# Patient Record
Sex: Female | Born: 1970 | Race: Black or African American | Hispanic: No | Marital: Single | Smoking: Former smoker
Health system: Southern US, Community
[De-identification: ages and names within clinical notes are randomized; demographics above are authoritative.]

## PROBLEM LIST (undated history)

## (undated) DIAGNOSIS — Z5900 Homelessness unspecified: Secondary | ICD-10-CM

## (undated) DIAGNOSIS — D571 Sickle-cell disease without crisis: Secondary | ICD-10-CM

## (undated) DIAGNOSIS — I10 Essential (primary) hypertension: Secondary | ICD-10-CM

## (undated) DIAGNOSIS — J45909 Unspecified asthma, uncomplicated: Secondary | ICD-10-CM

## (undated) DIAGNOSIS — H9192 Unspecified hearing loss, left ear: Secondary | ICD-10-CM

## (undated) HISTORY — PX: SPLENECTOMY, TOTAL: SHX788

## (undated) HISTORY — PX: CHOLECYSTECTOMY: SHX55

## (undated) HISTORY — PX: EYE SURGERY: SHX253

---

## 2008-09-10 ENCOUNTER — Ambulatory Visit: Payer: Self-pay | Admitting: Internal Medicine

## 2008-09-15 ENCOUNTER — Emergency Department: Payer: Self-pay | Admitting: Emergency Medicine

## 2008-09-21 ENCOUNTER — Inpatient Hospital Stay: Payer: Self-pay | Admitting: *Deleted

## 2008-10-05 ENCOUNTER — Ambulatory Visit: Payer: Self-pay | Admitting: Internal Medicine

## 2008-10-11 ENCOUNTER — Ambulatory Visit: Payer: Self-pay | Admitting: Internal Medicine

## 2009-07-14 ENCOUNTER — Ambulatory Visit: Payer: Self-pay | Admitting: Family Medicine

## 2009-12-11 ENCOUNTER — Ambulatory Visit: Payer: Self-pay | Admitting: Oncology

## 2009-12-26 ENCOUNTER — Inpatient Hospital Stay: Payer: Self-pay | Admitting: Internal Medicine

## 2010-01-11 ENCOUNTER — Ambulatory Visit: Payer: Self-pay | Admitting: Oncology

## 2010-04-18 ENCOUNTER — Ambulatory Visit: Payer: Self-pay | Admitting: Internal Medicine

## 2012-02-13 ENCOUNTER — Emergency Department: Payer: Self-pay | Admitting: Emergency Medicine

## 2012-02-13 LAB — URINALYSIS, COMPLETE
Bacteria: NONE SEEN
Bilirubin,UR: NEGATIVE
Glucose,UR: 50 mg/dL (ref 0–75)
Leukocyte Esterase: NEGATIVE
Nitrite: NEGATIVE
Specific Gravity: 1.012 (ref 1.003–1.030)
Squamous Epithelial: NONE SEEN
WBC UR: 4 /HPF (ref 0–5)

## 2012-02-13 LAB — DIFFERENTIAL: NRBC/100 WBC: 1 /

## 2012-02-13 LAB — COMPREHENSIVE METABOLIC PANEL
Anion Gap: 7 (ref 7–16)
Calcium, Total: 9.4 mg/dL (ref 8.5–10.1)
Co2: 27 mmol/L (ref 21–32)
EGFR (Non-African Amer.): >60
Osmolality: 275 (ref 275–301)
Potassium: 4.3 mmol/L (ref 3.5–5.1)
Sodium: 138 mmol/L (ref 136–145)

## 2012-02-13 LAB — CBC
HCT: 32.5 % — ABNORMAL LOW (ref 35.0–47.0)
Platelet: 243 10*3/uL (ref 150–440)
RBC: 3.18 10*6/uL — ABNORMAL LOW (ref 3.80–5.20)
RDW: 17.4 % — ABNORMAL HIGH (ref 11.5–14.5)
WBC: 12 10*3/uL — ABNORMAL HIGH (ref 3.6–11.0)

## 2012-02-13 LAB — LACTATE DEHYDROGENASE: LDH: 435 U/L — ABNORMAL HIGH (ref 84–246)

## 2012-02-13 LAB — LIPASE, BLOOD: Lipase: 65 U/L — ABNORMAL LOW (ref 73–393)

## 2012-10-23 ENCOUNTER — Encounter (HOSPITAL_COMMUNITY): Payer: Self-pay | Admitting: *Deleted

## 2012-10-23 ENCOUNTER — Emergency Department (HOSPITAL_COMMUNITY)
Admission: EM | Admit: 2012-10-23 | Discharge: 2012-10-23 | Disposition: A | Discharge status: Home / Self Care | Payer: Medicare Other | Attending: Emergency Medicine | Admitting: Emergency Medicine

## 2012-10-23 ENCOUNTER — Emergency Department (HOSPITAL_COMMUNITY): Payer: Medicare Other

## 2012-10-23 ENCOUNTER — Other Ambulatory Visit: Payer: Self-pay

## 2012-10-23 DIAGNOSIS — D571 Sickle-cell disease without crisis: Secondary | ICD-10-CM | POA: Insufficient documentation

## 2012-10-23 DIAGNOSIS — J3489 Other specified disorders of nose and nasal sinuses: Secondary | ICD-10-CM | POA: Insufficient documentation

## 2012-10-23 DIAGNOSIS — R0789 Other chest pain: Secondary | ICD-10-CM | POA: Insufficient documentation

## 2012-10-23 DIAGNOSIS — R059 Cough, unspecified: Secondary | ICD-10-CM | POA: Insufficient documentation

## 2012-10-23 DIAGNOSIS — F17200 Nicotine dependence, unspecified, uncomplicated: Secondary | ICD-10-CM | POA: Insufficient documentation

## 2012-10-23 DIAGNOSIS — R05 Cough: Secondary | ICD-10-CM | POA: Insufficient documentation

## 2012-10-23 DIAGNOSIS — R062 Wheezing: Secondary | ICD-10-CM | POA: Insufficient documentation

## 2012-10-23 DIAGNOSIS — J441 Chronic obstructive pulmonary disease with (acute) exacerbation: Secondary | ICD-10-CM | POA: Insufficient documentation

## 2012-10-23 DIAGNOSIS — Z79899 Other long term (current) drug therapy: Secondary | ICD-10-CM | POA: Insufficient documentation

## 2012-10-23 HISTORY — DX: Sickle-cell disease without crisis: D57.1

## 2012-10-23 LAB — CBC WITH DIFFERENTIAL/PLATELET
Basophils Absolute: 0.1 10*3/uL (ref 0.0–0.1)
Lymphocytes Relative: 12 % (ref 12–46)
Monocytes Relative: 10 % (ref 3–12)
Platelets: 358 10*3/uL (ref 150–400)
RDW: 15.5 % (ref 11.5–15.5)
WBC: 13.4 10*3/uL — ABNORMAL HIGH (ref 4.0–10.5)

## 2012-10-23 LAB — BASIC METABOLIC PANEL
BUN: 10 mg/dL (ref 6–23)
Calcium: 9.6 mg/dL (ref 8.4–10.5)
GFR calc Af Amer: >90 mL/min (ref >90)
GFR calc non Af Amer: >90 mL/min (ref >90)
Potassium: 3.1 mEq/L — ABNORMAL LOW (ref 3.5–5.1)
Sodium: 137 mEq/L (ref 135–145)

## 2012-10-23 LAB — POCT I-STAT TROPONIN I

## 2012-10-23 MED ORDER — IPRATROPIUM BROMIDE 0.02 % IN SOLN
RESPIRATORY_TRACT | Status: AC
  Administered 2012-10-23: 0.5 mg via RESPIRATORY_TRACT
  Filled 2012-10-23: qty 2.5

## 2012-10-23 MED ORDER — POTASSIUM CHLORIDE CRYS ER 20 MEQ PO TBCR
40.0000 meq | EXTENDED_RELEASE_TABLET | Freq: Once | ORAL | Status: AC
  Administered 2012-10-23: 40 meq via ORAL
  Filled 2012-10-23: qty 2

## 2012-10-23 MED ORDER — ALBUTEROL SULFATE HFA 108 (90 BASE) MCG/ACT IN AERS: 1.0000 | INHALATION_SPRAY | Freq: Four times a day (QID) | RESPIRATORY_TRACT | Status: DC | PRN

## 2012-10-23 MED ORDER — PREDNISONE 20 MG PO TABS: 40.0000 mg | ORAL_TABLET | Freq: Every day | ORAL | Status: DC

## 2012-10-23 MED ORDER — ALBUTEROL SULFATE (5 MG/ML) 0.5% IN NEBU
5.0000 mg | INHALATION_SOLUTION | Freq: Once | RESPIRATORY_TRACT | Status: AC
  Administered 2012-10-23: 5 mg via RESPIRATORY_TRACT
  Filled 2012-10-23: qty 1

## 2012-10-23 MED ORDER — IPRATROPIUM BROMIDE 0.02 % IN SOLN
0.5000 mg | Freq: Once | RESPIRATORY_TRACT | Status: AC
  Administered 2012-10-23: 0.5 mg via RESPIRATORY_TRACT
  Filled 2012-10-23: qty 2.5

## 2012-10-23 MED ORDER — ALBUTEROL SULFATE (5 MG/ML) 0.5% IN NEBU
INHALATION_SOLUTION | RESPIRATORY_TRACT | Status: AC
  Administered 2012-10-23: 12:00:00
  Filled 2012-10-23: qty 1

## 2012-10-23 MED ORDER — PREDNISONE 20 MG PO TABS
40.0000 mg | ORAL_TABLET | Freq: Once | ORAL | Status: AC
  Administered 2012-10-23: 40 mg via ORAL
  Filled 2012-10-23: qty 2

## 2012-10-23 NOTE — ED Notes (Signed)
Pt states "I can breathe fine." Pt denies shortness of breath.

## 2012-10-23 NOTE — ED Notes (Signed)
Ambulated pt.  Sats dropped to 86% and HR of 124.  PA Heather notified.  Pt requesting d/c home.

## 2012-10-23 NOTE — ED Notes (Signed)
Pt given nebulizer treatment

## 2012-10-23 NOTE — ED Provider Notes (Signed)
History     CSN: [id number]  Arrival date & time 10/23/12  1147   First MD Initiated Contact with Patient 10/23/12 1419      Chief Complaint  Patient presents with  . Shortness of Breath  . Cough    (Consider location/radiation/quality/duration/timing/severity/associated sxs/prior treatment) HPI Comments: Patient presents with a chief complaint of productive cough, nasal congestion, shortness of breath, wheezing, and tightness in her chest.  Symptoms have been present for the past 2 days and are gradually worsening.  She has taken Robitussin for her symptoms, but does not feel that it helps.  She does not have an inhaler.  She has given a nebulized breathing treatment in triage prior to my evaluation, which she reports did help with her symptoms.  She denies fever or chills.  Denies CP.  She reports that she has never been diagnosed with COPD or asthma.  She currently smokes 1ppd and has for approximately 22 years.    The history is provided by the patient.    Past Medical History  Diagnosis Date  . Sickle cell anemia     Past Surgical History  Procedure Date  . Cholecystectomy   . Eye surgery   . Splenectomy, total     No family history on file.  History  Substance Use Topics  . Smoking status: Current Every Day Smoker  . Smokeless tobacco: Not on file  . Alcohol Use: No    OB History    Grav Para Term Preterm Abortions TAB SAB Ect Mult Living                  Review of Systems  Constitutional: Negative for fever, chills and diaphoresis.  Respiratory: Positive for cough, chest tightness, shortness of breath and wheezing.   Cardiovascular: Negative for chest pain, palpitations and leg swelling.  Gastrointestinal: Negative for nausea and vomiting.  Neurological: Negative for dizziness, syncope and light-headedness.    Allergies  Review of patient's allergies indicates no known allergies.  Home Medications   Current Outpatient Rx  Name  Route  Sig   Dispense  Refill  . ALKA-SELTZER PLUS COLD PO   Oral   Take 1 capsule by mouth 2 (two) times daily as needed. For cold         . GUAIFENESIN 100 MG/5ML PO SOLN   Oral   Take 15 mLs by mouth every 4 (four) hours as needed. For cough         . LISINOPRIL-HYDROCHLOROTHIAZIDE 20-25 MG PO TABS   Oral   Take 1 tablet by mouth daily.         . ADULT MULTIVITAMIN W/MINERALS CH   Oral   Take 1 tablet by mouth daily.           BP 140/93  Pulse 91  Temp 98.1 F (36.7 C) (Oral)  Resp 26  SpO2 90%  LMP 10/06/2012  Physical Exam  Vitals reviewed. Constitutional: She appears well-developed and well-nourished. No distress.  HENT:  Head: Normocephalic and atraumatic.  Right Ear: Tympanic membrane and ear canal normal.  Left Ear: Tympanic membrane and ear canal normal.  Nose: Nose normal.  Mouth/Throat: Uvula is midline, oropharynx is clear and moist and mucous membranes are normal.  Neck: Normal range of motion. Neck supple.  Cardiovascular: Normal rate, regular rhythm and normal heart sounds.   Pulmonary/Chest: Effort normal. No accessory muscle usage. No respiratory distress. She has decreased breath sounds. She has wheezes.  Mild diffuse wheezing  Patient able to speak in complete sentences without difficulty.  Abdominal: Soft. There is no tenderness.  Neurological: She is alert.  Skin: Skin is warm and dry. She is not diaphoretic.  Psychiatric: She has a normal mood and affect.    ED Course  Procedures (including critical care time)  Labs Reviewed  CBC WITH DIFFERENTIAL - Abnormal; Notable for the following:    WBC 13.4 (*)     RBC 3.04 (*)     Hemoglobin 10.5 (*)     HCT 28.0 (*)     MCH 34.5 (*)     MCHC 37.5 (*)  SPHEROCYTES   All other components within normal limits  BASIC METABOLIC PANEL - Abnormal; Notable for the following:    Potassium 3.1 (*)     Glucose, Bld 112 (*)     All other components within normal limits  POCT I-STAT TROPONIN I   Dg  Chest 2 View  10/23/2012  *RADIOLOGY REPORT*  Clinical Data: Shortness of breath, cough, smoking history, sickle cell disease  CHEST - 2 VIEW  Comparison: None.  Findings: The lungs are clear but hyperaerated indicative of COPD. Mediastinal contours appear normal.  The heart is within normal limits in size.  No bony abnormality is seen.  IMPRESSION: No active lung disease.  Hyperaeration consistent with COPD.   Original Report Authenticated By: Dwyane Dee, M.D.      No diagnosis found.  4:28 PM Reassessed patient.  Lungs CTAB.  She reports that the shortness of breath and the tightness in her chest has resolved.  She is requesting to be discharged.    MDM   Patient presenting with COPD exacerbation.  Lung exam improved after nebulized breathing treatments. Pt states she breathing at baseline at the time of discharge. Pt has been instructed to continue using prescribed medications and inhaler.  Patient instructed to follow up with PCP.  Return precautions discussed with the patient.  She is in agreement with the plan.          Pascal Lux [place], PA-C 10/23/12 2349

## 2012-10-23 NOTE — ED Notes (Signed)
PT is here with sob started yesterday.  COUGH and cold feeling started Monday.  Pt has some chest tightness.  Pt reports yellow and white sputum

## 2012-10-23 NOTE — ED Notes (Signed)
Pt states since given medication she has been able to breathe better.

## 2012-10-24 NOTE — ED Provider Notes (Signed)
Medical screening examination/treatment/procedure(s) were performed by non-physician practitioner and as supervising physician I was immediately available for consultation/collaboration.  Tim Corriher, MD 10/24/12 1743 

## 2013-04-22 ENCOUNTER — Encounter (HOSPITAL_COMMUNITY): Payer: Self-pay | Admitting: *Deleted

## 2013-04-22 ENCOUNTER — Emergency Department (INDEPENDENT_AMBULATORY_CARE_PROVIDER_SITE_OTHER)
Admission: EM | Admit: 2013-04-22 | Discharge: 2013-04-22 | Disposition: A | Discharge status: Home / Self Care | Payer: Medicare Other | Source: Home / Self Care | Attending: Family Medicine | Admitting: Family Medicine

## 2013-04-22 ENCOUNTER — Emergency Department (INDEPENDENT_AMBULATORY_CARE_PROVIDER_SITE_OTHER): Payer: Medicare Other

## 2013-04-22 DIAGNOSIS — J45901 Unspecified asthma with (acute) exacerbation: Secondary | ICD-10-CM

## 2013-04-22 HISTORY — DX: Essential (primary) hypertension: I10

## 2013-04-22 HISTORY — DX: Unspecified asthma, uncomplicated: J45.909

## 2013-04-22 MED ORDER — METHYLPREDNISOLONE SODIUM SUCC 125 MG IJ SOLR
INTRAMUSCULAR | Status: AC
  Filled 2013-04-22: qty 2

## 2013-04-22 MED ORDER — METHYLPREDNISOLONE SODIUM SUCC 125 MG IJ SOLR
125.0000 mg | Freq: Once | INTRAMUSCULAR | Status: AC
  Administered 2013-04-22: 125 mg via INTRAMUSCULAR

## 2013-04-22 MED ORDER — ALBUTEROL SULFATE HFA 108 (90 BASE) MCG/ACT IN AERS: 1.0000 | INHALATION_SPRAY | Freq: Four times a day (QID) | RESPIRATORY_TRACT | Status: DC | PRN

## 2013-04-22 MED ORDER — ALBUTEROL SULFATE (5 MG/ML) 0.5% IN NEBU
INHALATION_SOLUTION | RESPIRATORY_TRACT | Status: AC
  Filled 2013-04-22: qty 1

## 2013-04-22 MED ORDER — LEVOFLOXACIN 500 MG PO TABS: 500.0000 mg | ORAL_TABLET | Freq: Every day | ORAL | Status: DC

## 2013-04-22 MED ORDER — ALBUTEROL SULFATE (5 MG/ML) 0.5% IN NEBU
5.0000 mg | INHALATION_SOLUTION | Freq: Once | RESPIRATORY_TRACT | Status: AC
  Administered 2013-04-22: 5 mg via RESPIRATORY_TRACT

## 2013-04-22 MED ORDER — IPRATROPIUM BROMIDE 0.02 % IN SOLN
0.5000 mg | Freq: Once | RESPIRATORY_TRACT | Status: AC
  Administered 2013-04-22: 0.5 mg via RESPIRATORY_TRACT

## 2013-04-22 NOTE — ED Notes (Signed)
Pt reports a flare of her asthma  She is not using an inhaler now

## 2013-04-22 NOTE — ED Provider Notes (Signed)
History     CSN: [id number]  Arrival date & time 04/22/13  1223   First MD Initiated Contact with Patient 04/22/13 1332      Chief Complaint  Patient presents with  . Asthma    (Consider location/radiation/quality/duration/timing/severity/associated sxs/prior treatment) Patient is a 42 y.o. female presenting with asthma. The history is provided by the patient.  Asthma This is a new problem. The current episode started 2 days ago. The problem has been gradually worsening. Associated symptoms include shortness of breath. The symptoms are aggravated by coughing, sneezing and smoking.    Past Medical History  Diagnosis Date  . Sickle cell anemia   . Asthma   . Hypertension     Past Surgical History  Procedure Laterality Date  . Cholecystectomy    . Eye surgery    . Splenectomy, total      No family history on file.  History  Substance Use Topics  . Smoking status: Current Every Day Smoker    Types: Cigarettes  . Smokeless tobacco: Not on file  . Alcohol Use: No    OB History   Grav Para Term Preterm Abortions TAB SAB Ect Mult Living                  Review of Systems  Constitutional: Negative.   HENT: Negative.   Respiratory: Positive for cough, chest tightness and shortness of breath.   Cardiovascular: Negative.     Allergies  Review of patient's allergies indicates no known allergies.  Home Medications   Current Outpatient Rx  Name  Route  Sig  Dispense  Refill  . lisinopril-hydrochlorothiazide (PRINZIDE,ZESTORETIC) 20-25 MG per tablet   Oral   Take 1 tablet by mouth daily.         . Multiple Vitamin (MULTIVITAMIN WITH MINERALS) TABS   Oral   Take 1 tablet by mouth daily.         [name] albuterol (PROVENTIL HFA;VENTOLIN HFA) 108 (90 BASE) MCG/ACT inhaler   Inhalation   Inhale 1-2 puffs into the lungs every 6 (six) hours as needed for wheezing.   1 Inhaler   0   . albuterol (PROVENTIL HFA;VENTOLIN HFA) 108 (90 BASE) MCG/ACT inhaler  Inhalation   Inhale 1-2 puffs into the lungs every 6 (six) hours as needed for wheezing.   1 Inhaler   0   . levofloxacin (LEVAQUIN) 500 MG tablet   Oral   Take 1 tablet (500 mg total) by mouth daily.   7 tablet   0     BP 178/99  Pulse 76  Temp(Src) 97.3 F (36.3 C) (Oral)  Resp 20  SpO2 93%  LMP 04/07/2013  Physical Exam  Nursing note and vitals reviewed. Constitutional: She is oriented to person, place, and time. She appears well-developed and well-nourished.  HENT:  Head: Normocephalic.  Right Ear: External ear normal.  Left Ear: External ear normal.  Mouth/Throat: Oropharynx is clear and moist.  Eyes: Pupils are equal, round, and reactive to light.  Neck: Normal range of motion. Neck supple.  Cardiovascular: Normal rate and regular rhythm.   Pulmonary/Chest: Effort normal. She has wheezes.  Lymphadenopathy:    She has no cervical adenopathy.  Neurological: She is alert and oriented to person, place, and time.  Skin: Skin is warm and dry.    ED Course  Procedures (including critical care time)  Labs Reviewed - No data to display Dg Chest 2 View  04/22/2013  *RADIOLOGY REPORT*  Clinical Data: Cough, shortness  of breath  CHEST - 2 VIEW  Comparison: 10/23/2012  Findings: Stable hyperinflation.  No focal pneumonia, collapse, consolidation, edema, effusion or pneumothorax.  Trachea midline. Normal heart size and vascularity.  IMPRESSION: Hyperinflation without focal pneumonia   Original Report Authenticated By: Judie Petit. Shick, M.D.      1. Asthmatic bronchitis with exacerbation       MDM  X-rays reviewed and report per radiologist.  Sx improved, lungs clear after neb at d/c.         Linna Hoff, MD 04/22/13 1431

## 2013-07-20 ENCOUNTER — Encounter (HOSPITAL_COMMUNITY): Payer: Self-pay

## 2013-07-20 ENCOUNTER — Emergency Department (HOSPITAL_COMMUNITY): Payer: Medicare Other

## 2013-07-20 ENCOUNTER — Emergency Department (HOSPITAL_COMMUNITY)
Admission: EM | Admit: 2013-07-20 | Discharge: 2013-07-20 | Discharge status: Against Medical Advice | Payer: Medicare Other | Attending: Emergency Medicine | Admitting: Emergency Medicine

## 2013-07-20 DIAGNOSIS — J45901 Unspecified asthma with (acute) exacerbation: Secondary | ICD-10-CM | POA: Insufficient documentation

## 2013-07-20 DIAGNOSIS — Z862 Personal history of diseases of the blood and blood-forming organs and certain disorders involving the immune mechanism: Secondary | ICD-10-CM | POA: Insufficient documentation

## 2013-07-20 DIAGNOSIS — M62838 Other muscle spasm: Secondary | ICD-10-CM | POA: Insufficient documentation

## 2013-07-20 DIAGNOSIS — Z9889 Other specified postprocedural states: Secondary | ICD-10-CM | POA: Insufficient documentation

## 2013-07-20 DIAGNOSIS — R197 Diarrhea, unspecified: Secondary | ICD-10-CM | POA: Insufficient documentation

## 2013-07-20 DIAGNOSIS — J449 Chronic obstructive pulmonary disease, unspecified: Secondary | ICD-10-CM | POA: Insufficient documentation

## 2013-07-20 DIAGNOSIS — R109 Unspecified abdominal pain: Secondary | ICD-10-CM

## 2013-07-20 DIAGNOSIS — M542 Cervicalgia: Secondary | ICD-10-CM | POA: Insufficient documentation

## 2013-07-20 DIAGNOSIS — M25512 Pain in left shoulder: Secondary | ICD-10-CM

## 2013-07-20 DIAGNOSIS — R42 Dizziness and giddiness: Secondary | ICD-10-CM | POA: Insufficient documentation

## 2013-07-20 DIAGNOSIS — Z3202 Encounter for pregnancy test, result negative: Secondary | ICD-10-CM | POA: Insufficient documentation

## 2013-07-20 DIAGNOSIS — Z79899 Other long term (current) drug therapy: Secondary | ICD-10-CM | POA: Insufficient documentation

## 2013-07-20 DIAGNOSIS — M25519 Pain in unspecified shoulder: Secondary | ICD-10-CM | POA: Insufficient documentation

## 2013-07-20 DIAGNOSIS — J4489 Other specified chronic obstructive pulmonary disease: Secondary | ICD-10-CM | POA: Insufficient documentation

## 2013-07-20 DIAGNOSIS — J441 Chronic obstructive pulmonary disease with (acute) exacerbation: Secondary | ICD-10-CM | POA: Insufficient documentation

## 2013-07-20 DIAGNOSIS — F411 Generalized anxiety disorder: Secondary | ICD-10-CM | POA: Insufficient documentation

## 2013-07-20 DIAGNOSIS — I10 Essential (primary) hypertension: Secondary | ICD-10-CM | POA: Insufficient documentation

## 2013-07-20 DIAGNOSIS — F17200 Nicotine dependence, unspecified, uncomplicated: Secondary | ICD-10-CM | POA: Insufficient documentation

## 2013-07-20 LAB — CBC WITH DIFFERENTIAL/PLATELET
Basophils Absolute: 0.1 10*3/uL (ref 0.0–0.1)
Eosinophils Absolute: 0.1 10*3/uL (ref 0.0–0.7)
Lymphs Abs: 2.5 10*3/uL (ref 0.7–4.0)
MCHC: 37.3 g/dL — ABNORMAL HIGH (ref 30.0–36.0)
MCV: 86.9 fL (ref 78.0–100.0)
Monocytes Relative: 8 % (ref 3–12)
Platelets: 363 10*3/uL (ref 150–400)
RDW: 15.7 % — ABNORMAL HIGH (ref 11.5–15.5)
WBC: 13.2 10*3/uL — ABNORMAL HIGH (ref 4.0–10.5)

## 2013-07-20 LAB — URINALYSIS, ROUTINE W REFLEX MICROSCOPIC
Leukocytes, UA: NEGATIVE
Nitrite: NEGATIVE
Protein, ur: NEGATIVE mg/dL
Urobilinogen, UA: 0.2 mg/dL (ref 0.0–1.0)

## 2013-07-20 LAB — COMPREHENSIVE METABOLIC PANEL
ALT: 17 U/L (ref 0–35)
BUN: 13 mg/dL (ref 6–23)
Calcium: 9.9 mg/dL (ref 8.4–10.5)
Creatinine, Ser: 0.71 mg/dL (ref 0.50–1.10)
GFR calc Af Amer: >90 mL/min (ref >90)
GFR calc non Af Amer: >90 mL/min (ref >90)
Glucose, Bld: 84 mg/dL (ref 70–99)
Sodium: 136 mEq/L (ref 135–145)
Total Protein: 8.1 g/dL (ref 6.0–8.3)

## 2013-07-20 LAB — LIPASE, BLOOD: Lipase: 39 U/L (ref 11–59)

## 2013-07-20 LAB — PREGNANCY, URINE: Preg Test, Ur: NEGATIVE

## 2013-07-20 LAB — POCT I-STAT TROPONIN I: Troponin i, poc: 0 ng/mL (ref 0.00–0.08)

## 2013-07-20 MED ORDER — IBUPROFEN 800 MG PO TABS
800.0000 mg | ORAL_TABLET | Freq: Once | ORAL | Status: AC
  Administered 2013-07-20: 800 mg via ORAL
  Filled 2013-07-20: qty 1

## 2013-07-20 NOTE — ED Provider Notes (Signed)
Medical screening examination/treatment/procedure(s) were performed by non-physician practitioner and as supervising physician I was immediately available for consultation/collaboration.  Patient with multiple complaints with a history of hypertension, COPD and sickle cell. Patient was complaining of some neck pain, lightheadedness and shortness of breath. She's been evaluated for ACS and was recommended to stay in the ED for second set of cardiac enzymes as well as d-dimer to rule out PE. Patient did have some mild hypoxia on exam.  Patient decided to leave the emergency department AGAINST MEDICAL ADVICE and was fully aware of the risks including death and was competent and capable of making this decision. Given return precautions.  Brittany Maw Claudeen Leason, DO 07/20/13 1554

## 2013-07-20 NOTE — ED Notes (Signed)
RUE:[id number]<JW> Expected date:<BR> Expected time:<BR> Means of arrival:<BR> Comments:<BR> 42 y/o F abd pain

## 2013-07-20 NOTE — ED Provider Notes (Signed)
CSN: [id number]     Arrival date & time 07/20/13  4098 History     First MD Initiated Contact with Patient 07/20/13 1006     Chief Complaint  Patient presents with  . Abdominal Pain  . Diarrhea  . Neck Spasm   . Anxiety   (Consider location/radiation/quality/duration/timing/severity/associated sxs/prior Treatment) HPI Comments: Patient is a 42 year old female with a history of hypertension, COPD, sickle cell anemia, and asthma who presents for a pain to her left neck and shoulder with associated shortness of breath and lightheadedness. Patient states the symptoms lasted approximately 20 minutes before spontaneously resolving. Has had similar episodes over the last 3 days which have also spontaneously resolved, but associated SOB and lightheadedness were new to presentation today. She denies any modifying factors of her symptoms as well as any associated fever, vision changes, chest pain, extremity numbness/tingling or weakness, and nausea or vomiting. Patient attributes symptoms to an elevation in her blood pressure. Endorses compliance with blood pressure medication; takes Prinzide daily. Patient also with secondary complaint of L suprapubic abdominal discomfort which is intermittent and burning and cramping in nature x 4 days. Denies diarrhea, melena, hematochezia, dysuria, and hematuria. LMP in April 2014; patient endorses sexual activity without use of barrier protection.  The history is provided by the patient. No language interpreter was used.    Past Medical History  Diagnosis Date  . Sickle cell anemia   . Asthma   . Hypertension    Past Surgical History  Procedure Laterality Date  . Cholecystectomy    . Eye surgery    . Splenectomy, total     No family history on file. History  Substance Use Topics  . Smoking status: Current Every Day Smoker    Types: Cigarettes  . Smokeless tobacco: Not on file  . Alcohol Use: No   OB History   Grav Para Term Preterm Abortions TAB  SAB Ect Mult Living                 Review of Systems  Constitutional: Negative for fever.  Respiratory: Positive for shortness of breath.   Gastrointestinal: Positive for abdominal pain. Negative for nausea and vomiting.  Genitourinary: Negative for dysuria and hematuria.  Musculoskeletal:       +"muscle spasm on L side of neck"  Neurological: Positive for light-headedness. Negative for dizziness and syncope.  All other systems reviewed and are negative.   Allergies  Review of patient's allergies indicates no known allergies.  Home Medications   Current Outpatient Rx  Name  Route  Sig  Dispense  Refill  . albuterol (PROVENTIL HFA;VENTOLIN HFA) 108 (90 BASE) MCG/ACT inhaler   Inhalation   Inhale 2 puffs into the lungs every 6 (six) hours as needed for wheezing.         [name] lisinopril-hydrochlorothiazide (PRINZIDE,ZESTORETIC) 20-25 MG per tablet   Oral   Take 1 tablet by mouth daily.          BP 124/85  Pulse 76  Temp(Src) 97.7 F (36.5 C) (Oral)  Resp 18  SpO2 94%  Physical Exam  Nursing note and vitals reviewed. Constitutional: She is oriented to person, place, and time. She appears well-developed and well-nourished. No distress.  HENT:  Head: Normocephalic and atraumatic.  Mouth/Throat: Oropharynx is clear and moist. No oropharyngeal exudate.  Eyes: Conjunctivae and EOM are normal. Pupils are equal, round, and reactive to light. No scleral icterus.  Neck: Normal range of motion.  Cardiovascular: Normal rate, regular rhythm and  normal heart sounds.   Pulmonary/Chest: Effort normal and breath sounds normal. No respiratory distress. She has no wheezes. She has no rales.  Abdominal: Soft. She exhibits no distension. There is no tenderness. There is no rebound and no guarding.  Musculoskeletal: Normal range of motion.  Neurological: She is alert and oriented to person, place, and time.  Skin: Skin is warm and dry. No rash noted. She is not diaphoretic. No erythema.   Psychiatric: She has a normal mood and affect. Her behavior is normal.   ED Course   Procedures (including critical care time)  Labs Reviewed  CBC WITH DIFFERENTIAL - Abnormal; Notable for the following:    WBC 13.2 (*)    RBC 2.90 (*)    Hemoglobin 9.4 (*)    HCT 25.2 (*)    MCHC 37.3 (*)    RDW 15.7 (*)    Neutro Abs 9.4 (*)    Monocytes Absolute 1.1 (*)    All other components within normal limits  COMPREHENSIVE METABOLIC PANEL - Abnormal; Notable for the following:    Total Bilirubin 3.2 (*)    All other components within normal limits  URINALYSIS, ROUTINE W REFLEX MICROSCOPIC - Abnormal; Notable for the following:    Hgb urine dipstick MODERATE (*)    All other components within normal limits  URINE MICROSCOPIC-ADD ON - Abnormal; Notable for the following:    Squamous Epithelial / LPF FEW (*)    Bacteria, UA FEW (*)    All other components within normal limits  LIPASE, BLOOD  PREGNANCY, URINE  POCT I-STAT TROPONIN I    Date: 07/20/2013  Rate: 73  Rhythm: normal sinus rhythm  QRS Axis: normal  Intervals: normal  ST/T Wave abnormalities: normal  Conduction Disutrbances:none  Narrative Interpretation: NSR with LVH; no STEMI  Old EKG Reviewed: unchanged from 10/23/2012 I have personally reviewed and interpreted this EKG  Dg Chest 2 View  07/20/2013   *RADIOLOGY REPORT*  Clinical Data: Shortness of breath, weakness.  CHEST - 2 VIEW  Comparison: 04/22/2013  Findings: Lungs are hyperinflated, clear.  Heart size and pulmonary vascularity normal.  No effusion.  Visualized bones unremarkable.  IMPRESSION: No acute disease   Original Report Authenticated By: D. Andria Rhein, MD   1. Shoulder pain, acute, left   2. Abdominal pain in female patient    MDM  Patient with hx of HTN, asthma, COPD, sickle cell anemia, and smoking presents for pain to her left neck and shoulder with associated shortness of breath and lightheadedness as well as cramping abdominal pain. CBC and CMP  c/w priors and lipase normal. UA without evidence of infection or hematuria; urine pregnancy negative. CXR without evidence of PNA, PTX, pleural effusion, or other acute cardiopulmonary process. EKG unchanged from prior and troponin 0.00. Patient has been hemodynamically stable during course of ED stay; she is afebrile without tachycardia, tachypnea, or dyspnea. O2 sats stable at 93%. This is likely patient's baseline given hx of COPD and tobacco use; however, have discussed symptoms with Dr. Elesa Massed and agree that further work up with second troponin and D-dimer reasonable for further work up.  Have recommended to patient that she remain in ED to have second troponin as well as D dimer completed for further work up of symptoms. Patient declines, stating that she needs to leave to sort out a bed at [facility] as she is homeless and her car, which she usually sleeps in, was repossessed this morning. I have discussed with the patient  that she is free to leave, but that she would be discharged against medical advise. Have stressed that premature discharge comes with a risk of adverse health events or worsening of symptoms, including death. Patient verbalizes understanding, stating she still wishes to leave AMA. I have urged patient to return should symptoms persist or worsen. Patient to d/c.             Antony Madura, PA-C 07/20/13 1459

## 2013-07-20 NOTE — ED Notes (Signed)
Made Kelly PA aware of pt's abd pain.

## 2013-07-20 NOTE — ED Notes (Signed)
Per ems: pt was at [facility] [employer], called ems with c/o lightheadedness and weakness. Hx of HTN, states she had not taken antihypertensive yet. Took med and lightheadedness decreased. C/o muscle spasms in neck that radiates down shoulders. Pain described as "kink" in neck. Pt reports increased stressors, is homeless, believes symptoms are related to stress. Pt also c/o abd pain and diarrhea x3 days. Initial bp 150/102, pulse 80, respirations 18. abd pain 4/10

## 2013-11-26 ENCOUNTER — Emergency Department (HOSPITAL_COMMUNITY): Payer: Medicare Other

## 2013-11-26 ENCOUNTER — Encounter (HOSPITAL_COMMUNITY): Payer: Self-pay | Admitting: Emergency Medicine

## 2013-11-26 ENCOUNTER — Emergency Department (HOSPITAL_COMMUNITY)
Admission: EM | Admit: 2013-11-26 | Discharge: 2013-11-26 | Disposition: A | Discharge status: Home / Self Care | Payer: Medicare Other | Attending: Emergency Medicine | Admitting: Emergency Medicine

## 2013-11-26 ENCOUNTER — Telehealth: Payer: Self-pay

## 2013-11-26 DIAGNOSIS — F17200 Nicotine dependence, unspecified, uncomplicated: Secondary | ICD-10-CM | POA: Insufficient documentation

## 2013-11-26 DIAGNOSIS — D5700 Hb-SS disease with crisis, unspecified: Secondary | ICD-10-CM | POA: Insufficient documentation

## 2013-11-26 DIAGNOSIS — I10 Essential (primary) hypertension: Secondary | ICD-10-CM | POA: Insufficient documentation

## 2013-11-26 DIAGNOSIS — Z79899 Other long term (current) drug therapy: Secondary | ICD-10-CM | POA: Insufficient documentation

## 2013-11-26 DIAGNOSIS — J45909 Unspecified asthma, uncomplicated: Secondary | ICD-10-CM | POA: Insufficient documentation

## 2013-11-26 DIAGNOSIS — J069 Acute upper respiratory infection, unspecified: Secondary | ICD-10-CM

## 2013-11-26 LAB — CBC WITH DIFFERENTIAL/PLATELET
Eosinophils Absolute: 0.3 10*3/uL (ref 0.0–0.7)
Eosinophils Relative: 5 % (ref 0–5)
Hemoglobin: 10.5 g/dL — ABNORMAL LOW (ref 12.0–15.0)
Lymphocytes Relative: 38 % (ref 12–46)
Lymphs Abs: 2.3 10*3/uL (ref 0.7–4.0)
MCH: 32.8 pg (ref 26.0–34.0)
MCV: 86.9 fL (ref 78.0–100.0)
Monocytes Relative: 17 % — ABNORMAL HIGH (ref 3–12)
RBC: 3.2 MIL/uL — ABNORMAL LOW (ref 3.87–5.11)

## 2013-11-26 LAB — COMPREHENSIVE METABOLIC PANEL
Alkaline Phosphatase: 43 U/L (ref 39–117)
BUN: 10 mg/dL (ref 6–23)
CO2: 24 mEq/L (ref 19–32)
Calcium: 9.3 mg/dL (ref 8.4–10.5)
GFR calc Af Amer: >90 mL/min (ref >90)
GFR calc non Af Amer: >90 mL/min (ref >90)
Glucose, Bld: 85 mg/dL (ref 70–99)
Potassium: 3.3 mEq/L — ABNORMAL LOW (ref 3.5–5.1)
Total Protein: 7.5 g/dL (ref 6.0–8.3)

## 2013-11-26 LAB — RETICULOCYTES: Retic Ct Pct: 6.8 % — ABNORMAL HIGH (ref 0.4–3.1)

## 2013-11-26 MED ORDER — KETOROLAC TROMETHAMINE 30 MG/ML IJ SOLN
30.0000 mg | Freq: Once | INTRAMUSCULAR | Status: DC
  Filled 2013-11-26: qty 1

## 2013-11-26 MED ORDER — OXYCODONE-ACETAMINOPHEN 5-325 MG PO TABS: 2.0000 | ORAL_TABLET | ORAL | Status: DC | PRN

## 2013-11-26 MED ORDER — HYDROMORPHONE HCL PF 2 MG/ML IJ SOLN
2.0000 mg | Freq: Once | INTRAMUSCULAR | Status: AC
  Administered 2013-11-26: 2 mg via INTRAVENOUS
  Filled 2013-11-26: qty 1

## 2013-11-26 MED ORDER — HYDROMORPHONE HCL PF 1 MG/ML IJ SOLN
1.0000 mg | Freq: Once | INTRAMUSCULAR | Status: AC
  Administered 2013-11-26: 1 mg via INTRAVENOUS
  Filled 2013-11-26: qty 1

## 2013-11-26 MED ORDER — KETOROLAC TROMETHAMINE 30 MG/ML IJ SOLN
30.0000 mg | Freq: Once | INTRAMUSCULAR | Status: AC
  Administered 2013-11-26: 30 mg via INTRAVENOUS
  Filled 2013-11-26: qty 1

## 2013-11-26 MED ORDER — ONDANSETRON HCL 4 MG/2ML IJ SOLN
4.0000 mg | Freq: Once | INTRAMUSCULAR | Status: AC
  Administered 2013-11-26: 4 mg via INTRAVENOUS
  Filled 2013-11-26: qty 2

## 2013-11-26 MED ORDER — SODIUM CHLORIDE 0.9 % IV BOLUS (SEPSIS)
1000.0000 mL | Freq: Once | INTRAVENOUS | Status: AC
  Administered 2013-11-26: 1000 mL via INTRAVENOUS

## 2013-11-26 NOTE — ED Provider Notes (Signed)
CSN: [id number]     Arrival date & time 11/26/13  1220 History   First MD Initiated Contact with Patient 11/26/13 1313     Chief Complaint  Patient presents with  . Sickle Cell Pain Crisis  . Cough   (Consider location/radiation/quality/duration/timing/severity/associated sxs/prior Treatment) HPI Comments: Patient presents to the ER for evaluation of sickle cell crisis. Patient has a history of Buckner disease. She reports that she has not had a crisis in a very long time. Patient reports that approximately 5 or 6 days ago she started having sinus congestion and cough. She took whatever medications for this and the symptoms improved, but 3 days ago she started having crisis. Patient reports pain in elbows and knees which is consistent with previous sickle cell crisis. She is not coughing any longer, denies chest pain there is no shortness of breath. She has not had any fever. Pain is constant and severe in the joints.  Patient is a 42 y.o. female presenting with sickle cell pain and cough.  Sickle Cell Pain Crisis Associated symptoms: cough   Cough Associated symptoms: rhinorrhea     Past Medical History  Diagnosis Date  . Sickle cell anemia   . Asthma   . Hypertension    Past Surgical History  Procedure Laterality Date  . Cholecystectomy    . Eye surgery    . Splenectomy, total     History reviewed. No pertinent family history. History  Substance Use Topics  . Smoking status: Current Every Day Smoker    Types: Cigarettes  . Smokeless tobacco: Not on file  . Alcohol Use: No   OB History   Grav Para Term Preterm Abortions TAB SAB Ect Mult Living                 Review of Systems  HENT: Positive for rhinorrhea.   Respiratory: Positive for cough.   Musculoskeletal: Positive for arthralgias.  All other systems reviewed and are negative.    Allergies  Review of patient's allergies indicates no known allergies.  Home Medications   Current Outpatient Rx  Name  Route  Sig   Dispense  Refill  . albuterol (PROVENTIL HFA;VENTOLIN HFA) 108 (90 BASE) MCG/ACT inhaler   Inhalation   Inhale 2 puffs into the lungs every 6 (six) hours as needed for wheezing.          BP 141/101  Pulse 83  Temp(Src) 98.4 F (36.9 C) (Oral)  Resp 16  Ht 5\' 9"  (1.753 m)  Wt 130 lb (58.968 kg)  BMI 19.19 kg/m2  SpO2 94% Physical Exam  Constitutional: She is oriented to person, place, and time. She appears well-developed and well-nourished. No distress.  HENT:  Head: Normocephalic and atraumatic.  Right Ear: Hearing normal.  Left Ear: Hearing normal.  Nose: Nose normal.  Mouth/Throat: Oropharynx is clear and moist and mucous membranes are normal.  Eyes: Conjunctivae and EOM are normal. Pupils are equal, round, and reactive to light.  Neck: Normal range of motion. Neck supple.  Cardiovascular: Regular rhythm, S1 normal and S2 normal.  Exam reveals no gallop and no friction rub.   No murmur heard. Pulmonary/Chest: Effort normal and breath sounds normal. No respiratory distress. She exhibits no tenderness.  Abdominal: Soft. Normal appearance and bowel sounds are normal. There is no hepatosplenomegaly. There is no tenderness. There is no rebound, no guarding, no tenderness at McBurney's point and negative Murphy's sign. No hernia.  Musculoskeletal: Normal range of motion.  Patient reports severe pain  in elbows and knees, but no redness, warmth, swelling or effusions.  Neurological: She is alert and oriented to person, place, and time. She has normal strength. No cranial nerve deficit or sensory deficit. Coordination normal. GCS eye subscore is 4. GCS verbal subscore is 5. GCS motor subscore is 6.  Skin: Skin is warm, dry and intact. No rash noted. No cyanosis.  Psychiatric: She has a normal mood and affect. Her speech is normal and behavior is normal. Thought content normal.    ED Course  Procedures (including critical care time) Labs Review Labs Reviewed  CBC WITH DIFFERENTIAL  - Abnormal; Notable for the following:    RBC 3.20 (*)    Hemoglobin 10.5 (*)    HCT 27.8 (*)    MCHC 37.8 (*)    RDW 15.7 (*)    Neutrophils Relative % 39 (*)    Monocytes Relative 17 (*)    Basophils Relative 2 (*)    All other components within normal limits  COMPREHENSIVE METABOLIC PANEL - Abnormal; Notable for the following:    Potassium 3.3 (*)    Total Bilirubin 4.1 (*)    All other components within normal limits  RETICULOCYTES - Abnormal; Notable for the following:    Retic Ct Pct 6.8 (*)    RBC. 3.20 (*)    Retic Count, Manual 217.6 (*)    All other components within normal limits   Imaging Review Dg Chest 2 View  11/26/2013   CLINICAL DATA:  Cough; sickle cell crisis  EXAM: CHEST  2 VIEW  COMPARISON:  July 20, 2013  FINDINGS: The lungs are clear. Heart size and pulmonary vascularity are normal. No adenopathy. No bone lesions.  IMPRESSION: No abnormality noted.   Electronically Signed   By: Bretta Bang M.D.   On: 11/26/2013 12:59    EKG Interpretation   None       MDM  Diagnosis: 1. Sickle cell crisis 2. Upper respiratory infection  Patient presents to the ER for evaluation of what appears to be an acute sickle cell crisis. Patient is complaining of joint pain consistent with previous. No sign of joint infection. Lab work is appropriate for patient's. Chest x-ray performed because of a cough, no pneumonia or other abnormality. No concern for acute chest syndrome. Patient hydrated and given analgesia with improvement. Will be discharged to continue outpatient analgesic therapy.    Gilda Crease, MD 11/26/13 [phone]

## 2013-11-26 NOTE — ED Notes (Signed)
Pt sts sickle cell crisis with pain in elbows and knees; pt sts cough and congestion as well as back pain

## 2013-11-26 NOTE — ED Notes (Signed)
Pt told that she is ready for discharge. sts that she doesn't have a ride home and doesn't feel well. I expressed to her that we could get her a bus pass and possibly even a taxi. sts that she wants to stay in the hospital. Earlier in the day when Dr. Blinda Leatherwood explained the plan with her to discharge after fluids and be sent home with prescription she agreed. Pt is requesting to speak with physician and she is aware. Pt resting in bed and in no acute distress.

## 2013-11-26 NOTE — ED Notes (Signed)
Spoke with care management and pt receiving cash for medication assistance and social worker working on a Electronics engineer.

## 2013-11-26 NOTE — Progress Notes (Signed)
ED CM received incoming call from Traci RN on Pod D to meet with patient regarding medication assistance.Pt presents to ED with SSC and cough.  In to room to meet with patient Pt reports that she recently relocated to the area and has not gotten linked to [facility] yet. Pt has Medicare, and states that she cannot afford the $3.00 co-pay for her pain meds. Will assist patient with co-pay and with [facility] referral. Pt is agreement with plan. Co-pay has been provided by an anonymous donor.. Pt informed and is appreciative. She is also concerned with how she will return home today. Placed a referral to [facility] for f/u ,Winnie Troxler CM at [facility] will contact patient at number provided in record. Pt informed and agrees with discharge plan.  Discuss discharge plan with Traci RN on Pod D. No further ED CM needs identified.

## 2013-11-26 NOTE — ED Notes (Signed)
Case management paged to see if pt can receive assistance with ride home and pain medication. MD spoke with pt and told her the plan.

## 2013-11-26 NOTE — ED Notes (Signed)
Case manager at bedside 

## 2013-11-26 NOTE — Telephone Encounter (Signed)
This CM received email from Brittany Shannon, CM with [place] re: Brittany Shannon has relocated to this area and in need of provider for her SCD. This CM advised Brittany Shannon via email that a call was left on Brittany Shannon's voicemail and a new patient packet will be mailed to her, based on the address in EPIC. Once Brittany Shannon completes and returns the packet, med records will be requested, then CM and MD will review file and call to make an appointment if appropriate. This CM will continue to monitor.     Brittany Caldwell, RN, BSN, [place]    [phone]

## 2014-01-01 ENCOUNTER — Emergency Department (INDEPENDENT_AMBULATORY_CARE_PROVIDER_SITE_OTHER)
Admission: EM | Admit: 2014-01-01 | Discharge: 2014-01-01 | Disposition: A | Discharge status: Home / Self Care | Payer: Medicare Other | Source: Home / Self Care

## 2014-01-01 ENCOUNTER — Encounter (HOSPITAL_COMMUNITY): Payer: Self-pay | Admitting: Emergency Medicine

## 2014-01-01 DIAGNOSIS — D571 Sickle-cell disease without crisis: Secondary | ICD-10-CM

## 2014-01-01 DIAGNOSIS — D649 Anemia, unspecified: Secondary | ICD-10-CM

## 2014-01-01 DIAGNOSIS — I10 Essential (primary) hypertension: Secondary | ICD-10-CM

## 2014-01-01 HISTORY — DX: Unspecified hearing loss, left ear: H91.92

## 2014-01-01 LAB — POCT I-STAT, CHEM 8
BUN: 10 mg/dL (ref 6–23)
CHLORIDE: 102 meq/L (ref 96–112)
Calcium, Ion: 1.17 mmol/L (ref 1.12–1.23)
Creatinine, Ser: 0.7 mg/dL (ref 0.50–1.10)
GLUCOSE: 86 mg/dL (ref 70–99)
HEMATOCRIT: 31 % — AB (ref 36.0–46.0)
Hemoglobin: 10.5 g/dL — ABNORMAL LOW (ref 12.0–15.0)
POTASSIUM: 3.4 meq/L — AB (ref 3.7–5.3)
Sodium: 140 mEq/L (ref 137–147)
TCO2: 26 mmol/L (ref 0–100)

## 2014-01-01 MED ORDER — LISINOPRIL 10 MG PO TABS: 10.0000 mg | ORAL_TABLET | Freq: Every day | ORAL | Status: DC

## 2014-01-01 NOTE — ED Notes (Signed)
Reviewed referral information

## 2014-01-01 NOTE — ED Provider Notes (Signed)
Medical screening examination/treatment/procedure(s) were performed by non-physician practitioner and as supervising physician I was immediately available for consultation/collaboration.  Philipp Deputy, M.D.   Harden Mo, MD 01/01/14 [phone]

## 2014-01-01 NOTE — ED Notes (Signed)
Called to assess patient at registration.  Patient complaints of blood pressure being elevated.  Patient reports arms feeling weak.  Denies numbness or tingling in extremities.  Bilateral grips equal, strong.  Reports feeling bad for 2 days.

## 2014-01-01 NOTE — ED Notes (Signed)
Hypertension, out out of medicine for one month.  Patient reports she has not felt well for 2 days.   Patient denies any pain: chest or head pain.  Patient denies numbness, tingling, or weakness.  Patient reports not feeling well.

## 2014-01-01 NOTE — ED Provider Notes (Signed)
CSN: [id number]     Arrival date & time 01/01/14  1118 History   First MD Initiated Contact with Patient 01/01/14 1222     Chief Complaint  Patient presents with  . 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 She is unable to describe exactly what she means by not feeling well. Denies chest pain, heaviness, tightness, pressure or fullness. Denies shortness of breath, cough or orthopnea. She has been out of her medicines for one month. It is documented that she takes 5 mg of lisinopril daily. This was prescribed by a PCP in another city. It is noted that on December 17 that she discussed obtaining a physician for sickle cell with hospital staff to perform dysfunction. She was belted packet to complete and then seen back today she may obtain an appointment. The patient states that she has not  pick up her mail yet has not completed.   Past Medical History  Diagnosis Date  . Sickle cell anemia   . Asthma   . Hypertension   . Deafness in left ear    Past Surgical History  Procedure Laterality Date  . Cholecystectomy    . Eye surgery    . Splenectomy, total     No family history on file. History  Substance Use Topics  . Smoking status: Current Every Day Smoker    Types: Cigarettes  . Smokeless tobacco: Not on file  . Alcohol Use: No   OB History   Grav Para Term Preterm Abortions TAB SAB Ect Mult Living                 Review of Systems  Constitutional: Positive for activity change. Negative for fever, chills and diaphoresis.  Respiratory: Negative.   Cardiovascular: Negative.   Gastrointestinal: Negative.   Genitourinary: Negative.   Skin: Negative.   Neurological: Positive for weakness and headaches.   Psychiatric/Behavioral: Negative for confusion and agitation. The patient is not nervous/anxious.     Allergies  Review of patient's allergies indicates no known allergies.  Home Medications   Current Outpatient Rx  Name  Route  Sig  Dispense  Refill  . lisinopril (PRINIVIL,ZESTRIL) 5 MG tablet   Oral   Take 5 mg by mouth daily.         [name] albuterol (PROVENTIL HFA;VENTOLIN HFA) 108 (90 BASE) MCG/ACT inhaler   Inhalation   Inhale 2 puffs into the lungs every 6 (six) hours as needed for wheezing.         [name] lisinopril (PRINIVIL) 10 MG tablet   Oral   Take 1 tablet (10 mg total) by mouth daily.   30 tablet   0   . oxyCODONE-acetaminophen (PERCOCET) 5-325 MG per tablet   Oral   Take 2 tablets by mouth every 4 (four) hours as needed.   20 tablet   0    BP 155/98  Pulse 76  Temp(Src) 97.7 F (36.5 C) (Oral)  Resp 18  SpO2 98% Physical Exam  Nursing note and vitals reviewed. Constitutional: She is oriented to person, place, and time. She appears well-developed and well-nourished. No distress.  Body habitus small and thin.  HENT:  Head: Normocephalic and atraumatic.  Right Ear: External ear normal.  Left Ear: External ear normal.  Nose: Nose normal.  Mouth/Throat: Oropharynx is clear  and moist. No oropharyngeal exudate.  Eyes: Conjunctivae and EOM are normal. Pupils are equal, round, and reactive to light.  Neck: Normal range of motion. Neck supple.  Cardiovascular: Normal rate and normal heart sounds.   Pulmonary/Chest: Effort normal and breath sounds normal. No respiratory distress. She has no wheezes. She has no rales.  Abdominal: Soft. There is no tenderness.  Musculoskeletal: Normal range of motion.  Lymphadenopathy:    She has no cervical adenopathy.  Neurological: She is alert and oriented to person, place, and time. No cranial nerve deficit.  Skin: Skin is warm and dry.  Psychiatric: She has a normal mood and affect.    ED Course  Procedures (including  critical care time) Labs Review Labs Reviewed  POCT I-STAT, CHEM 8 - Abnormal; Notable for the following:    Potassium 3.4 (*)    Hemoglobin 10.5 (*)    HCT 31.0 (*)    All other components within normal limits   Imaging Review No results found.    MDM   1. HTN (hypertension)   2. Anemia   3. Sickle cell disease    Lisinopril 10 mg one daily #30 He must make the effort to complete the paperwork mailed to you one month ago. Other alternatives including calling [phone] obtain an appointment.    Janne Napoleon, NP 01/01/14 1322

## 2014-01-01 NOTE — Discharge Instructions (Signed)
Anemia, Nonspecific Anemia is a condition in which the concentration of red blood cells or hemoglobin in the blood is below normal. Hemoglobin is a substance in red blood cells that carries oxygen to the tissues of the body. Anemia results in not enough oxygen reaching these tissues.  CAUSES  Common causes of anemia include:   Excessive bleeding. Bleeding may be internal or external. This includes excessive bleeding from periods (in women) or from the intestine.   Poor nutrition.   Chronic kidney, thyroid, and liver disease.  Bone marrow disorders that decrease red blood cell production.  Cancer and treatments for cancer.  HIV, AIDS, and their treatments.  Spleen problems that increase red blood cell destruction.  Blood disorders.  Excess destruction of red blood cells due to infection, medicines, and autoimmune disorders. SIGNS AND SYMPTOMS   Minor weakness.   Dizziness.   Headache.  Palpitations.   Shortness of breath, especially with exercise.   Paleness.  Cold sensitivity.  Indigestion.  Nausea.  Difficulty sleeping.  Difficulty concentrating. Symptoms may occur suddenly or they may develop slowly.  DIAGNOSIS  Additional blood tests are often needed. These help your health care provider determine the best treatment. Your health care provider will check your stool for blood and look for other causes of blood loss.  TREATMENT  Treatment varies depending on the cause of the anemia. Treatment can include:   Supplements of iron, vitamin 123456, or folic acid.   Hormone medicines.   A blood transfusion. This may be needed if blood loss is severe.   Hospitalization. This may be needed if there is significant continual blood loss.   Dietary changes.  Spleen removal. HOME CARE INSTRUCTIONS Keep all follow-up appointments. It often takes many weeks to correct anemia, and having your health care provider check on your condition and your response to  treatment is very important. SEEK IMMEDIATE MEDICAL CARE IF:   You develop extreme weakness, shortness of breath, or chest pain.   You become dizzy or have trouble concentrating.  You develop heavy vaginal bleeding.   You develop a rash.   You have bloody or black, tarry stools.   You faint.   You vomit up blood.   You vomit repeatedly.   You have abdominal pain.  You have a fever or persistent symptoms for more than 2 3 days.   You have a fever and your symptoms suddenly get worse.   You are dehydrated.  MAKE SURE YOU:  Understand these instructions.  Will watch your condition.  Will get help right away if you are not doing well or get worse. Document Released: 01/04/2005 Document Revised: 07/30/2013 Document Reviewed: 05/23/2013 [facility] Patient Information 2014 [place].  Hypertension As your heart beats, it forces blood through your arteries. This force is your blood pressure. If the pressure is too high, it is called hypertension (HTN) or high blood pressure. HTN is dangerous because you may have it and not know it. High blood pressure may mean that your heart has to work harder to pump blood. Your arteries may be narrow or stiff. The extra work puts you at risk for heart disease, stroke, and other problems.  Blood pressure consists of two numbers, a higher number over a lower, 110/72, for example. It is stated as "110 over 72." The ideal is below 120 for the top number (systolic) and under 80 for the bottom (diastolic). Write down your blood pressure today. You should pay close attention to your blood pressure if  you have certain conditions such as:  Heart failure.  Prior heart attack.  Diabetes  Chronic kidney disease.  Prior stroke.  Multiple risk factors for heart disease. To see if you have HTN, your blood pressure should be measured while you are seated with your arm held at the level of the heart. It should be measured at least twice. A  one-time elevated blood pressure reading (especially in the Emergency Department) does not mean that you need treatment. There may be conditions in which the blood pressure is different between your right and left arms. It is important to see your caregiver soon for a recheck. Most people have essential hypertension which means that there is not a specific cause. This type of high blood pressure may be lowered by changing lifestyle factors such as:  Stress.  Smoking.  Lack of exercise.  Excessive weight.  Drug/tobacco/alcohol use.  Eating less salt. Most people do not have symptoms from high blood pressure until it has caused damage to the body. Effective treatment can often prevent, delay or reduce that damage. TREATMENT  When a cause has been identified, treatment for high blood pressure is directed at the cause. There are a large number of medications to treat HTN. These fall into several categories, and your caregiver will help you select the medicines that are best for you. Medications may have side effects. You should review side effects with your caregiver. If your blood pressure stays high after you have made lifestyle changes or started on medicines,   Your medication(s) may need to be changed.  Other problems may need to be addressed.  Be certain you understand your prescriptions, and know how and when to take your medicine.  Be sure to follow up with your caregiver within the time frame advised (usually within two weeks) to have your blood pressure rechecked and to review your medications.  If you are taking more than one medicine to lower your blood pressure, make sure you know how and at what times they should be taken. Taking two medicines at the same time can result in blood pressure that is too low. SEEK IMMEDIATE MEDICAL CARE IF:  You develop a severe headache, blurred or changing vision, or confusion.  You have unusual weakness or numbness, or a faint feeling.  You  have severe chest or abdominal pain, vomiting, or breathing problems. MAKE SURE YOU:   Understand these instructions.  Will watch your condition.  Will get help right away if you are not doing well or get worse. Document Released: 11/27/2005 Document Revised: 02/19/2012 Document Reviewed: 07/17/2008 [facility] Patient Information 2014 [place].  Sickle Cell Anemia, Adult Sickle cell anemia is a condition in which red blood cells have an abnormal "sickle" shape. This abnormal shape shortens the cells' life span, which results in a lower than normal concentration of red blood cells in the blood. The sickle shape also causes the cells to clump together and block free blood flow through the blood vessels. As a result, the tissues and organs of the body do not receive enough oxygen. Sickle cell anemia causes organ damage and pain and increases the risk of infection. CAUSES  Sickle cell anemia is a genetic disorder. Those who receive two copies of the gene have the condition, and those who receive one copy have the trait. RISK FACTORS The sickle cell gene is most common in people whose families originated in [place]. Other areas of the globe where sickle cell trait occurs include the [place], [place]  and [place], the [place], and the [place].  SIGNS AND SYMPTOMS  Pain, especially in the extremities, back, chest, or abdomen (common). The pain may start suddenly or may develop following an illness, especially if there is dehydration. Pain can also occur due to overexertion or exposure to extreme temperature changes.  Frequent severe bacterial infections, especially certain types of pneumonia and meningitis.  Pain and swelling in the hands and feet.  Decreased activity.   Loss of appetite.   Change in behavior.  Headaches.  Seizures.  Shortness of breath or difficulty breathing.  Vision changes.  Skin ulcers. Those with the trait may not have symptoms or they  may have mild symptoms.  DIAGNOSIS  Sickle cell anemia is diagnosed with blood tests that demonstrate the genetic trait. It is often diagnosed during the newborn period, due to mandatory testing nationwide. A variety of blood tests, X-rays, CT scans, MRI scans, ultrasounds, and lung function tests may also be done to monitor the condition. TREATMENT  Sickle cell anemia may be treated with:  Medicines. You may be given pain medicines, antibiotic medicines (to treat and prevent infections) or medicines to increase the production of certain types of hemoglobin.  Fluids.  Oxygen.  Blood transfusions. HOME CARE INSTRUCTIONS   Drink enough fluid to keep your urine clear or pale yellow. Increase your fluid intake in hot weather and during exercise.  Do not smoke. Smoking lowers oxygen levels in the blood.   Only take over-the-counter or prescription medicines for pain, fever, or discomfort as directed by your health care provider.  Take antibiotics as directed by your health care provider. Make sure you finish them it even if you start to feel better.   Take supplements as directed by your health care provider.   Consider wearing a medical alert bracelet. This tells anyone caring for you in an emergency of your condition.   When traveling, keep your medical information, health care provider's names, and the medicines you take with you at all times.   If you develop a fever, do not take medicines to reduce the fever right away. This could cover up a problem that is developing. Notify your health care provider.  Keep all follow-up appointments with your health care provider. Sickle cell anemia requires regular medical care. SEEK MEDICAL CARE IF: You have a fever. SEEK IMMEDIATE MEDICAL CARE IF:   You feel dizzy or faint.   You have new abdominal pain, especially on the left side near the stomach area.   You develop a persistent, often uncomfortable and painful penile erection  (priapism). If this is not treated immediately it will lead to impotence.   You have numbness your arms or legs or you have a hard time moving them.   You have a hard time with speech.   You have a fever or persistent symptoms for more than 2 3 days.   You have a fever and your symptoms suddenly get worse.   You have signs or symptoms of infection. These include:   Chills.   Abnormal tiredness (lethargy).   Irritability.   Poor eating.   Vomiting.   You develop pain that is not helped with medicine.   You develop shortness of breath.  You have pain in your chest.   You are coughing up pus-like or bloody sputum.   You develop a stiff neck.  Your feet or hands swell or have pain.  Your abdomen appears bloated.  You develop joint pain. MAKE SURE  YOU:  Understand these instructions.  Will watch your child's condition.  Will get help right away if your child is not doing well or gets worse. Document Released: 03/07/2006 Document Revised: 09/17/2013 Document Reviewed: 07/09/2013 [facility] Patient Information 2014 [place], [place].

## 2014-01-10 ENCOUNTER — Ambulatory Visit: Payer: Medicare Other

## 2014-06-21 ENCOUNTER — Encounter (HOSPITAL_COMMUNITY): Payer: Self-pay | Admitting: Emergency Medicine

## 2014-06-21 ENCOUNTER — Emergency Department (HOSPITAL_COMMUNITY)
Admission: EM | Admit: 2014-06-21 | Discharge: 2014-06-21 | Disposition: A | Discharge status: Home / Self Care | Payer: Medicare Other | Attending: Emergency Medicine | Admitting: Emergency Medicine

## 2014-06-21 DIAGNOSIS — K047 Periapical abscess without sinus: Secondary | ICD-10-CM | POA: Diagnosis not present

## 2014-06-21 DIAGNOSIS — Z7982 Long term (current) use of aspirin: Secondary | ICD-10-CM | POA: Insufficient documentation

## 2014-06-21 DIAGNOSIS — K089 Disorder of teeth and supporting structures, unspecified: Secondary | ICD-10-CM | POA: Diagnosis present

## 2014-06-21 DIAGNOSIS — Z862 Personal history of diseases of the blood and blood-forming organs and certain disorders involving the immune mechanism: Secondary | ICD-10-CM | POA: Insufficient documentation

## 2014-06-21 DIAGNOSIS — I10 Essential (primary) hypertension: Secondary | ICD-10-CM | POA: Insufficient documentation

## 2014-06-21 DIAGNOSIS — F17200 Nicotine dependence, unspecified, uncomplicated: Secondary | ICD-10-CM | POA: Diagnosis not present

## 2014-06-21 DIAGNOSIS — J45909 Unspecified asthma, uncomplicated: Secondary | ICD-10-CM | POA: Diagnosis not present

## 2014-06-21 DIAGNOSIS — H9190 Unspecified hearing loss, unspecified ear: Secondary | ICD-10-CM | POA: Diagnosis not present

## 2014-06-21 DIAGNOSIS — Z79899 Other long term (current) drug therapy: Secondary | ICD-10-CM | POA: Insufficient documentation

## 2014-06-21 MED ORDER — HYDROCODONE-ACETAMINOPHEN 5-325 MG PO TABS
1.0000 | ORAL_TABLET | Freq: Once | ORAL | Status: AC
  Administered 2014-06-21: 1 via ORAL
  Filled 2014-06-21: qty 1

## 2014-06-21 MED ORDER — PENICILLIN V POTASSIUM 500 MG PO TABS
500.0000 mg | ORAL_TABLET | Freq: Four times a day (QID) | ORAL | Status: AC
Start: 2014-06-21 — End: 2014-06-28

## 2014-06-21 MED ORDER — HYDROCODONE-ACETAMINOPHEN 5-325 MG PO TABS: 1.0000 | ORAL_TABLET | ORAL | Status: DC | PRN

## 2014-06-21 MED ORDER — LISINOPRIL 10 MG PO TABS: 10.0000 mg | ORAL_TABLET | Freq: Every day | ORAL | Status: DC

## 2014-06-21 NOTE — ED Notes (Signed)
Pt reports a broken tooth and increased pain 10/10

## 2014-06-21 NOTE — Discharge Instructions (Signed)
Take the prescribed medication as directed. Follow-up with Ladell Pier-- call and schedule appt.  May also follow-up with other dentist if preferred. Return to the ED for new or worsening symptoms.   Emergency Department Resource Guide 1) Find a Doctor and Pay Out of Pocket Although you won't have to find out who is covered by your insurance plan, it is a good idea to ask around and get recommendations. You will then need to call the office and see if the doctor you have chosen will accept you as a new patient and what types of options they offer for patients who are self-pay. Some doctors offer discounts or will set up payment plans for their patients who do not have insurance, but you will need to ask so you aren't surprised when you get to your appointment.  2) Contact Your Local Health Department Not all health departments have doctors that can see patients for sick visits, but many do, so it is worth a call to see if yours does. If you don't know where your local health department is, you can check in your phone book. The CDC also has a tool to help you locate your state's health department, and many state websites also have listings of all of their local health departments.  3) Find a [facility] If your illness is not likely to be very severe or complicated, you may want to try a walk in clinic. These are popping up all over the country in pharmacies, drugstores, and shopping centers. They're usually staffed by nurse practitioners or physician assistants that have been trained to treat common illnesses and complaints. They're usually fairly quick and inexpensive. However, if you have serious medical issues or chronic medical problems, these are probably not your best option.  No Primary Care Doctor: - Call Health Connect at  [phone] - they can help you locate a primary care doctor that  accepts your insurance, provides certain services, etc. - Physician Referral Service-  [phone]  Chronic Pain Problems: Organization         Address  Phone   Notes  [facility]  [phone] Patients need to be referred by their primary care doctor.   Medication Assistance: Organization         Address  Phone   Notes  [facility] Medication [facility] [address] [place], [place] [address] [phone] --Must be a resident of [facility] -- Must have NO insurance coverage whatsoever (no Medicaid/ Medicare, etc.) -- The pt. MUST have a primary care doctor that directs their care regularly and follows them in the community   [facility]ist  [phone]   [employer]  [phone]    Agencies that provide inexpensive medical care: Organization         Address  Phone   Notes  [place]  [phone]   [facility]    [phone]   [facility] [place], [place] [address] [phone]   [place] [address], [place] [phone]   Planned Parenthood    [phone]   [facility]    [phone]   [place] and [address], [place] Phone:  [phone], Fax:  [phone] Hours of Operation:  9 am - 6 pm, M-F.  Also accepts Medicaid/Medicare and self-pay.  [facility]  [place], [address], [place] Phone: [phone], Fax: [phone]. Hours of Operation:  8:30 am - 5:30 pm, M-F.  Also accepts Medicaid and self-pay.  [facility] [address], [place] Phone: [phone]   [place], [place], [place] [phone], Ext. [phone] Mondays & Thursdays: 7-9 AM.  First 15 patients are seen on a first come, first serve basis.    [place] Providers:  Organization         Address  Phone   Notes  [facility] [address],  [place] [phone] Also accepts self-pay patients.  [facility] [address], [place]  [phone]   [place], [address], [place] [phone]   [facility] [address] [place] [phone]   Lucianne Lei [address], [place]   [phone] Only accepts [place] Access [place] patients after they have their name applied to their card.   Self-Pay (no insurance) in [facility]:  Organization         Address  Phone   Notes  Sickle Cell Patients, [facility] [place] [phone]   [facility] [place] [phone]   [facility] [place]  [place], [place], [place] [phone]   [facility]/Dr. Osei-Bonsu  [address], [place] or [address], [place] [phone] Phone number for both [place] and [place] locations is the same.  [facility]gent [facility] [address], [place] [phone]   [facility] [address] [place] or [address] [phone]   [facility] [address] [place] [phone], phone; [phone], fax Sees patients 1st and 3rd Saturday of every month.  Must not qualify for public or private insurance (i.e. Medicaid, Medicare, [place] Health Choice, Veterans' Benefits)  Household income should be no more than 200% of the poverty level The clinic cannot treat you if you are pregnant or think you are pregnant  Sexually transmitted diseases are not treated at the clinic.    Dental Care: Organization         Address  Phone  Notes  [facility] Department of [facility] [place] [phone] Accepts children up to age 50 who are enrolled in  [place] or [place]; pregnant women with a Medicaid card; and children who have applied for Medicaid or [place] Health Choice, but were declined, whose parents can pay a reduced fee at time of service.  [facility] Department of [facility]  [address], [place] [phone] Accepts children up to age 26 who are enrolled in [place] or [place]; pregnant women with a Medicaid card; and children who have applied for Medicaid or [place] Health Choice, but were declined, whose parents can pay a reduced fee at time of service.  [place] Adult Dental Access PROGRAM  [place] [phone] Patients are seen by appointment only. Walk-ins are not accepted. [place] will see patients 70 years of age and older. Monday - Tuesday (8am-5pm) Most Wednesdays (8:30-5pm) $30 per visit, cash only  [place] Adult  Dental Access PROGRAM  [address], [facility] [phone] Patients are seen by appointment only. Walk-ins are not accepted. [place] will see patients 42 years of age and older. One Wednesday Evening (Monthly: Volunteer Based).  $30 per visit, cash only  [place]  [phone] for adults; Children under age 54, call Graduate Pediatric Dentistry at [phone]. Children aged 65-14, please call [phone] to request a pediatric application.  Dental services are provided in all areas of dental care including fillings, crowns and bridges, complete and partial dentures, implants, gum treatment, root canals, and extractions. Preventive care is also provided. Treatment is provided to both adults and children. Patients are selected via a lottery and there is often a waiting list.   [facility] [address] [place]  [phone] [email or web address]   [facility] [address] [place], [place] [phone], Ext. [phone] Second and Fourth Thursday of each month, opens at 6:30  AM; Clinic ends at 9 AM.  Patients are seen on a first-come first-served basis, and a limited number are seen during each clinic.   [facility]  [address] Hillard Danker [place], [place] [phone]   Eligibility Requirements You must have lived in [place], [place], or [place] counties for at least the last three months.   You cannot be eligible for state or federal sponsored [employer], including [employer], [place], or [employer].   You generally cannot be eligible for healthcare insurance through your employer.    How to apply: Eligibility screenings are held every Tuesday and Wednesday afternoon from 1:00 pm until 4:00 pm. You do not need an appointment for the interview!  [facility] [address] [place], [place]   [place]  [place] Department  [place]  [phone]    Behavioral Health Resources in the Community: Intensive Outpatient Programs Organization         Address  Phone  Notes  [place]. [address], [place], [place] [phone]   [facility] Outpatient [address], [place], [place]   ADS: Alcohol & Drug Svcs [address] [place], [place]   [place] [address]  [place], [place] or [phone]   Substance Abuse Resources Organization         Address  Phone  Notes  Alcohol and Drug Services  [phone]   [place]  [phone]   [facility] [place]   Chinita Pester  [phone]   Residential & Outpatient Substance Abuse Program  [phone]   Psychological Services Organization         Address  Phone  Notes  [facility] [place]  [place]  [phone]   [place] [address], [place] [phone] or  [phone]    Mobile Crisis Teams Organization         Address  Phone  Notes  [facility], Mobile Crisis Care Unit  [phone]   [facility] Psychotherapeutic Services  [address] [place], [place]   [place] [address] [place] [phone]    Self-Help/Support Groups Organization         Address  Phone             Notes  Mental  Health Assoc. of [place] - variety of support groups  [place] Call for more information  Narcotics Anonymous (NA), [facility]aring [facility] [address], [employer] [place]  2 meetings at this location   Special educational needs teacher         Address  Phone  Notes  [facility] Residential Treatment [place],    [place]  [phone]   [facility]  [address] [place] [address], [place], [place]   [place], [place] [phone] Admissions: 8am-3pm M-F  Incentives Substance [place] [address],    [place], [place] [phone]   [facility] [address] [place], [place], [place]   [facility] [place] [facility]ually [facility] [address]  [place], [place]   [facility]ight Programs - Intensive Outpatient [address], [place], [place]   [facility] [place].,  [place], [place] [phone] or [phone]   Residential Treatment Services (RTS) [address] [place], [place] Accepts Medicaid  [facility] [place] [address]  [place] [phone] Substance Abuse/Addiction Treatment   [facility] Organization         Address  Phone  Notes  [facility]  [phone]   Domenic Schwab, PhD [address] [place], [place]   [phone] or [phone]   [facility] [place], [place] [phone]   [facility] [address]  [place], [place] [phone] Insurance/Medicaid/sponsorship through [facility] and Families [address] [place] [address]                                    [place], [place] [phone] [place] [address], [place] [phone]    Dr. Adele Schilder  [phone]   [facility] Dept. 1) [address], [place] 2) [place] 3)  [place] [address], [place] [phone]  [phone]   [place] [phone] or [phone] (After Hours)

## 2014-06-21 NOTE — ED Notes (Signed)
Declined W/C at D/C and was escorted to lobby by RN. 

## 2014-06-21 NOTE — ED Provider Notes (Signed)
CSN: [id number]     Arrival date & time 06/21/14  0753 History   First MD Initiated Contact with Patient 06/21/14 0802     Chief Complaint  Patient presents with  . Dental Pain     (Consider location/radiation/quality/duration/timing/severity/associated sxs/prior Treatment) The history is provided by the patient and medical records.   This is a 43 y.o. F with PMH significant for HTN, SCA, asthma, presenting to the ED for left lower dental pain.  Pt states pain started on Tuesday has progressively worsened.  States last night she began noticing swelling along the left lower jaw line.  She denies difficulty swallowing.  States increased pain with chewing on affected side.  Denies fever, chills.  Hx of dental abscesses in the past.  Pt is not currently established with dentist.  Has been taking BC powders for pain with minimal relief.  Patient hypertensive on arrival, states she has not taken her lisinopril in several days because she ran out. She denies any headache, chest pain, shortness of breath.  Past Medical History  Diagnosis Date  . Sickle cell anemia   . Asthma   . Hypertension   . Deafness in left ear    Past Surgical History  Procedure Laterality Date  . Cholecystectomy    . Eye surgery    . Splenectomy, total     History reviewed. No pertinent family history. History  Substance Use Topics  . Smoking status: Current Every Day Smoker    Types: Cigarettes  . Smokeless tobacco: Not on file  . Alcohol Use: No   OB History   Grav Para Term Preterm Abortions TAB SAB Ect Mult Living                 Review of Systems  HENT: Positive for dental problem.   All other systems reviewed and are negative.     Allergies  Review of patient's allergies indicates no known allergies.  Home Medications   Prior to Admission medications   Medication Sig Start Date End Date Taking? Authorizing Provider  acetaminophen (TYLENOL) 325 MG tablet Take 650 mg by mouth every 6 (six)  hours as needed for mild pain or moderate pain.   Yes Historical Provider, MD  Aspirin-Caffeine (BC FAST PAIN RELIEF PO) Take 1 Package by mouth every 6 (six) hours as needed (for pain).   Yes Historical Provider, MD  albuterol (PROVENTIL HFA;VENTOLIN HFA) 108 (90 BASE) MCG/ACT inhaler Inhale 2 puffs into the lungs every 6 (six) hours as needed for wheezing.    Historical Provider, MD  lisinopril (PRINIVIL) 10 MG tablet Take 1 tablet (10 mg total) by mouth daily. 01/01/14   Janne Napoleon, NP   BP 170/113  Pulse 78  Temp(Src) 98.2 F (36.8 C) (Oral)  Resp 20  Ht 5\' 9"  (1.753 m)  Wt 126 lb (57.153 kg)  BMI 18.60 kg/m2  SpO2 100%  Physical Exam  Nursing note and vitals reviewed. Constitutional: She is oriented to person, place, and time. She appears well-developed and well-nourished. No distress.  HENT:  Head: Normocephalic and atraumatic.  Mouth/Throat: Uvula is midline, oropharynx is clear and moist and mucous membranes are normal. No oral lesions. No trismus in the jaw. Abnormal dentition. Dental abscesses and dental caries present. No oropharyngeal exudate, posterior oropharyngeal edema, posterior oropharyngeal erythema or tonsillar abscesses.    Teeth largely in poor dentition, left lower premolar broken with large cavity present, surrounding gingiva swollen and erythematous with some discoloaration noted, handling secretions appropriately, no  trismus  Eyes: Conjunctivae and EOM are normal. Pupils are equal, round, and reactive to light.  Neck: Normal range of motion. Neck supple.  Cardiovascular: Normal rate, regular rhythm and normal heart sounds.   Pulmonary/Chest: Effort normal and breath sounds normal. No respiratory distress. She has no wheezes.  Musculoskeletal: Normal range of motion.  Neurological: She is alert and oriented to person, place, and time.  Skin: Skin is warm and dry. She is not diaphoretic.  Psychiatric: She has a normal mood and affect.    ED Course  Procedures  (including critical care time) Labs Review Labs Reviewed - No data to display  Imaging Review No results found.   EKG Interpretation None      MDM   Final diagnoses:  Dental abscess   Left lower premolar with signs of dental abscess. She is afebrile and overall nontoxic appearing. She is handling secretions appropriately and airways patent. She'll be started on penicillin and Vicodin. She is instructed to follow-up with dentist, referral and resource guide provided.  Patient hypertensive, but asymptomatic and no signs of endorgan damage. I have refilled her lisinopril for one month.  Will FU with PCP regarding BP.  Discussed plan with patient, he/she acknowledged understanding and agreed with plan of care.  Return precautions given for new or worsening symptoms.  Larene Pickett, PA-C 06/21/14 [phone]

## 2014-06-29 NOTE — ED Provider Notes (Signed)
Medical screening examination/treatment/procedure(s) were performed by non-physician practitioner and as supervising physician I was immediately available for consultation/collaboration.   EKG Interpretation None        Tanna Furry, MD 06/29/14 [phone]

## 2015-03-11 DIAGNOSIS — H905 Unspecified sensorineural hearing loss: Secondary | ICD-10-CM | POA: Insufficient documentation

## 2015-03-11 DIAGNOSIS — D5720 Sickle-cell/Hb-C disease without crisis: Secondary | ICD-10-CM | POA: Insufficient documentation

## 2015-08-04 ENCOUNTER — Encounter (HOSPITAL_COMMUNITY): Payer: Self-pay

## 2015-08-04 ENCOUNTER — Emergency Department (HOSPITAL_COMMUNITY): Payer: Medicare Other

## 2015-08-04 ENCOUNTER — Emergency Department (HOSPITAL_COMMUNITY)
Admission: EM | Admit: 2015-08-04 | Discharge: 2015-08-04 | Disposition: A | Discharge status: Home / Self Care | Payer: Medicare Other | Attending: Emergency Medicine | Admitting: Emergency Medicine

## 2015-08-04 DIAGNOSIS — I10 Essential (primary) hypertension: Secondary | ICD-10-CM | POA: Diagnosis not present

## 2015-08-04 DIAGNOSIS — S6992XA Unspecified injury of left wrist, hand and finger(s), initial encounter: Secondary | ICD-10-CM | POA: Diagnosis present

## 2015-08-04 DIAGNOSIS — S63502A Unspecified sprain of left wrist, initial encounter: Secondary | ICD-10-CM | POA: Insufficient documentation

## 2015-08-04 DIAGNOSIS — H9192 Unspecified hearing loss, left ear: Secondary | ICD-10-CM | POA: Diagnosis not present

## 2015-08-04 DIAGNOSIS — Z79899 Other long term (current) drug therapy: Secondary | ICD-10-CM | POA: Insufficient documentation

## 2015-08-04 DIAGNOSIS — Z862 Personal history of diseases of the blood and blood-forming organs and certain disorders involving the immune mechanism: Secondary | ICD-10-CM | POA: Diagnosis not present

## 2015-08-04 DIAGNOSIS — Y9389 Activity, other specified: Secondary | ICD-10-CM | POA: Insufficient documentation

## 2015-08-04 DIAGNOSIS — Z720 Tobacco use: Secondary | ICD-10-CM | POA: Diagnosis not present

## 2015-08-04 DIAGNOSIS — Y998 Other external cause status: Secondary | ICD-10-CM | POA: Insufficient documentation

## 2015-08-04 DIAGNOSIS — J45909 Unspecified asthma, uncomplicated: Secondary | ICD-10-CM | POA: Insufficient documentation

## 2015-08-04 DIAGNOSIS — T148XXA Other injury of unspecified body region, initial encounter: Secondary | ICD-10-CM

## 2015-08-04 DIAGNOSIS — Y9289 Other specified places as the place of occurrence of the external cause: Secondary | ICD-10-CM | POA: Diagnosis not present

## 2015-08-04 MED ORDER — TRAMADOL HCL 50 MG PO TABS
50.0000 mg | ORAL_TABLET | Freq: Once | ORAL | Status: AC
  Administered 2015-08-04: 50 mg via ORAL
  Filled 2015-08-04: qty 1

## 2015-08-04 MED ORDER — TRAMADOL HCL 50 MG PO TABS: 50.0000 mg | ORAL_TABLET | Freq: Four times a day (QID) | ORAL | Status: DC | PRN
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
## 2015-08-04 NOTE — Discharge Instructions (Signed)
Cryotherapy °Cryotherapy means treatment with cold. Ice or gel packs can be used to reduce both pain and swelling. Ice is the most helpful within the first 24 to 48 hours after an injury or flare-up from overusing a muscle or joint. Sprains, strains, spasms, burning pain, shooting pain, and aches can all be eased with ice. Ice can also be used when recovering from surgery. Ice is effective, has very few side effects, and is safe for most people to use. °PRECAUTIONS  °Ice is not a safe treatment option for people with: °· Raynaud phenomenon. This is a condition affecting small blood vessels in the extremities. Exposure to cold may cause your problems to return. °· Cold hypersensitivity. There are many forms of cold hypersensitivity, including: °¨ Cold urticaria. Red, itchy hives appear on the skin when the tissues begin to warm after being iced. °¨ Cold erythema. This is a red, itchy rash caused by exposure to cold. °¨ Cold hemoglobinuria. Red blood cells break down when the tissues begin to warm after being iced. The hemoglobin that carry oxygen are passed into the urine because they cannot combine with blood proteins fast enough. °· Numbness or altered sensitivity in the area being iced. °If you have any of the following conditions, do not use ice until you have discussed cryotherapy with your caregiver: °· Heart conditions, such as arrhythmia, angina, or chronic heart disease. °· High blood pressure. °· Healing wounds or open skin in the area being iced. °· Current infections. °· Rheumatoid arthritis. °· Poor circulation. °· Diabetes. °Ice slows the blood flow in the region it is applied. This is beneficial when trying to stop inflamed tissues from spreading irritating chemicals to surrounding tissues. However, if you expose your skin to cold temperatures for too long or without the proper protection, you can damage your skin or nerves. Watch for signs of skin damage due to cold. °HOME CARE INSTRUCTIONS °Follow  these tips to use ice and cold packs safely. °· Place a dry or damp towel between the ice and skin. A damp towel will cool the skin more quickly, so you may need to shorten the time that the ice is used. °· For a more rapid response, add gentle compression to the ice. °· Ice for no more than 10 to 20 minutes at a time. The bonier the area you are icing, the less time it will take to get the benefits of ice. °· Check your skin after 5 minutes to make sure there are no signs of a poor response to cold or skin damage. °· Rest 20 minutes or more between uses. °· Once your skin is numb, you can end your treatment. You can test numbness by very lightly touching your skin. The touch should be so light that you do not see the skin dimple from the pressure of your fingertip. When using ice, most people will feel these normal sensations in this order: cold, burning, aching, and numbness. °· Do not use ice on someone who cannot communicate their responses to pain, such as small children or people with dementia. °HOW TO MAKE AN ICE PACK °Ice packs are the most common way to use ice therapy. Other methods include ice massage, ice baths, and cryosprays. Muscle creams that cause a cold, tingly feeling do not offer the same benefits that ice offers and should not be used as a substitute unless recommended by your caregiver. °To make an ice pack, do one of the following: °· Place crushed ice or a   bag of frozen vegetables in a sealable plastic bag. Squeeze out the excess air. Place this bag inside another plastic bag. Slide the bag into a pillowcase or place a damp towel between your skin and the bag. °· Mix 3 parts water with 1 part rubbing alcohol. Freeze the mixture in a sealable plastic bag. When you remove the mixture from the freezer, it will be slushy. Squeeze out the excess air. Place this bag inside another plastic bag. Slide the bag into a pillowcase or place a damp towel between your skin and the bag. °SEEK MEDICAL CARE  IF: °· You develop white spots on your skin. This may give the skin a blotchy (mottled) appearance. °· Your skin turns blue or pale. °· Your skin becomes waxy or hard. °· Your swelling gets worse. °MAKE SURE YOU:  °· Understand these instructions. °· Will watch your condition. °· Will get help right away if you are not doing well or get worse. °Document Released: 07/24/2011 Document Revised: 04/13/2014 Document Reviewed: 07/24/2011 °ExitCare® Patient Information ©2015 [employer]. This information is not intended to replace advice given to you by your health care provider. Make sure you discuss any questions you have with your health care provider. ° °

## 2016-07-14 ENCOUNTER — Observation Stay (HOSPITAL_COMMUNITY): Payer: Medicare Other

## 2016-07-14 ENCOUNTER — Emergency Department (HOSPITAL_COMMUNITY): Payer: Medicare Other

## 2016-07-14 ENCOUNTER — Encounter (HOSPITAL_COMMUNITY): Payer: Self-pay | Admitting: Emergency Medicine

## 2016-07-14 ENCOUNTER — Observation Stay (HOSPITAL_COMMUNITY)
Admission: EM | Admit: 2016-07-14 | Discharge: 2016-07-16 | Disposition: A | Discharge status: Home / Self Care | Payer: Medicare Other | Attending: Student in an Organized Health Care Education/Training Program | Admitting: Student in an Organized Health Care Education/Training Program

## 2016-07-14 DIAGNOSIS — F17210 Nicotine dependence, cigarettes, uncomplicated: Secondary | ICD-10-CM | POA: Diagnosis not present

## 2016-07-14 DIAGNOSIS — I10 Essential (primary) hypertension: Secondary | ICD-10-CM | POA: Diagnosis present

## 2016-07-14 DIAGNOSIS — N179 Acute kidney failure, unspecified: Secondary | ICD-10-CM | POA: Insufficient documentation

## 2016-07-14 DIAGNOSIS — R079 Chest pain, unspecified: Secondary | ICD-10-CM | POA: Diagnosis present

## 2016-07-14 DIAGNOSIS — Z7982 Long term (current) use of aspirin: Secondary | ICD-10-CM | POA: Insufficient documentation

## 2016-07-14 DIAGNOSIS — J45909 Unspecified asthma, uncomplicated: Secondary | ICD-10-CM | POA: Diagnosis not present

## 2016-07-14 DIAGNOSIS — R0902 Hypoxemia: Secondary | ICD-10-CM

## 2016-07-14 DIAGNOSIS — D5720 Sickle-cell/Hb-C disease without crisis: Secondary | ICD-10-CM | POA: Diagnosis present

## 2016-07-14 HISTORY — DX: Homelessness: Z59.0

## 2016-07-14 HISTORY — DX: Homelessness unspecified: Z59.00

## 2016-07-14 LAB — I-STAT TROPONIN, ED: Troponin i, poc: 0 ng/mL (ref 0.00–0.08)

## 2016-07-14 LAB — URINE MICROSCOPIC-ADD ON

## 2016-07-14 LAB — COMPREHENSIVE METABOLIC PANEL
ALT: 21 U/L (ref 14–54)
AST: 37 U/L (ref 15–41)
Albumin: 4.1 g/dL (ref 3.5–5.0)
Alkaline Phosphatase: 41 U/L (ref 38–126)
Anion gap: 7 (ref 5–15)
BUN: 26 mg/dL — ABNORMAL HIGH (ref 6–20)
CO2: 26 mmol/L (ref 22–32)
Calcium: 9.6 mg/dL (ref 8.9–10.3)
Chloride: 108 mmol/L (ref 101–111)
Creatinine, Ser: 1.49 mg/dL — ABNORMAL HIGH (ref 0.44–1.00)
GFR, EST AFRICAN AMERICAN: 48 mL/min — AB (ref >60)
GFR, EST NON AFRICAN AMERICAN: 41 mL/min — AB (ref >60)
Glucose, Bld: 123 mg/dL — ABNORMAL HIGH (ref 65–99)
Potassium: 3.5 mmol/L (ref 3.5–5.1)
Sodium: 141 mmol/L (ref 135–145)
TOTAL PROTEIN: 7.6 g/dL (ref 6.5–8.1)
Total Bilirubin: 3.1 mg/dL — ABNORMAL HIGH (ref 0.3–1.2)

## 2016-07-14 LAB — BASIC METABOLIC PANEL
ANION GAP: 12 (ref 5–15)
BUN: 28 mg/dL — ABNORMAL HIGH (ref 6–20)
CALCIUM: 10.4 mg/dL — AB (ref 8.9–10.3)
CO2: 25 mmol/L (ref 22–32)
Chloride: 101 mmol/L (ref 101–111)
Creatinine, Ser: 1.75 mg/dL — ABNORMAL HIGH (ref 0.44–1.00)
GFR calc non Af Amer: 34 mL/min — ABNORMAL LOW (ref >60)
GFR, EST AFRICAN AMERICAN: 39 mL/min — AB (ref >60)
Glucose, Bld: 103 mg/dL — ABNORMAL HIGH (ref 65–99)
POTASSIUM: 2.7 mmol/L — AB (ref 3.5–5.1)
Sodium: 138 mmol/L (ref 135–145)

## 2016-07-14 LAB — URINALYSIS, ROUTINE W REFLEX MICROSCOPIC
Bilirubin Urine: NEGATIVE
Glucose, UA: NEGATIVE mg/dL
Hgb urine dipstick: NEGATIVE
KETONES UR: NEGATIVE mg/dL
Nitrite: NEGATIVE
Protein, ur: NEGATIVE mg/dL
Specific Gravity, Urine: 1.028 (ref 1.005–1.030)
pH: 5.5 (ref 5.0–8.0)

## 2016-07-14 LAB — LACTATE DEHYDROGENASE: LDH: 269 U/L — ABNORMAL HIGH (ref 98–192)

## 2016-07-14 LAB — RETICULOCYTES
RBC.: 2.53 MIL/uL — ABNORMAL LOW (ref 3.87–5.11)
Retic Count, Absolute: 197.3 10*3/uL — ABNORMAL HIGH (ref 19.0–186.0)
Retic Ct Pct: 7.8 % — ABNORMAL HIGH (ref 0.4–3.1)

## 2016-07-14 LAB — CBC
HEMATOCRIT: 22.9 % — AB (ref 36.0–46.0)
Hemoglobin: 8.4 g/dL — ABNORMAL LOW (ref 12.0–15.0)
MCH: 33.2 pg (ref 26.0–34.0)
MCHC: 36.7 g/dL — AB (ref 30.0–36.0)
MCV: 90.5 fL (ref 78.0–100.0)
PLATELETS: 288 10*3/uL (ref 150–400)
RBC: 2.53 MIL/uL — ABNORMAL LOW (ref 3.87–5.11)
RDW: 16.4 % — AB (ref 11.5–15.5)
WBC: 13.6 10*3/uL — ABNORMAL HIGH (ref 4.0–10.5)

## 2016-07-14 LAB — D-DIMER, QUANTITATIVE (NOT AT ARMC): D DIMER QUANT: 2.07 ug{FEU}/mL — AB (ref 0.00–0.50)

## 2016-07-14 LAB — PREGNANCY, URINE: Preg Test, Ur: NEGATIVE

## 2016-07-14 LAB — CBG MONITORING, ED: GLUCOSE-CAPILLARY: 137 mg/dL — AB (ref 65–99)

## 2016-07-14 LAB — MAGNESIUM: Magnesium: 2.2 mg/dL (ref 1.7–2.4)

## 2016-07-14 MED ORDER — POTASSIUM CHLORIDE CRYS ER 20 MEQ PO TBCR
40.0000 meq | EXTENDED_RELEASE_TABLET | Freq: Once | ORAL | Status: AC
  Administered 2016-07-14: 40 meq via ORAL
  Filled 2016-07-14: qty 2

## 2016-07-14 MED ORDER — ONDANSETRON HCL 4 MG/2ML IJ SOLN
4.0000 mg | Freq: Once | INTRAMUSCULAR | Status: AC
  Administered 2016-07-14: 4 mg via INTRAVENOUS
  Filled 2016-07-14: qty 2

## 2016-07-14 MED ORDER — DEXTROSE-NACL 5-0.45 % IV SOLN
INTRAVENOUS | Status: AC
  Administered 2016-07-14: 14:00:00 via INTRAVENOUS

## 2016-07-14 MED ORDER — OXYCODONE HCL 5 MG PO TABS
5.0000 mg | ORAL_TABLET | ORAL | Status: DC | PRN
Start: 2016-07-14 — End: 2016-07-16
  Administered 2016-07-14 – 2016-07-16 (×4): 5 mg via ORAL
  Filled 2016-07-14 (×4): qty 1

## 2016-07-14 MED ORDER — IOPAMIDOL (ISOVUE-370) INJECTION 76%
INTRAVENOUS | Status: AC
  Administered 2016-07-14: 80 mL
  Filled 2016-07-14: qty 100

## 2016-07-14 MED ORDER — ALBUTEROL SULFATE (2.5 MG/3ML) 0.083% IN NEBU: 2.5000 mg | INHALATION_SOLUTION | Freq: Once | RESPIRATORY_TRACT | Status: DC

## 2016-07-14 MED ORDER — SODIUM CHLORIDE 0.9 % IV BOLUS (SEPSIS)
1000.0000 mL | Freq: Once | INTRAVENOUS | Status: AC
  Administered 2016-07-14: 1000 mL via INTRAVENOUS

## 2016-07-14 MED ORDER — ALBUTEROL SULFATE HFA 108 (90 BASE) MCG/ACT IN AERS: 1.0000 | INHALATION_SPRAY | RESPIRATORY_TRACT | Status: DC | PRN

## 2016-07-14 MED ORDER — MORPHINE SULFATE (PF) 4 MG/ML IV SOLN
4.0000 mg | Freq: Once | INTRAVENOUS | Status: AC
  Administered 2016-07-14: 4 mg via INTRAVENOUS
  Filled 2016-07-14: qty 1

## 2016-07-14 MED ORDER — POTASSIUM CHLORIDE 10 MEQ/100ML IV SOLN
10.0000 meq | Freq: Once | INTRAVENOUS | Status: AC
  Administered 2016-07-14: 10 meq via INTRAVENOUS
  Filled 2016-07-14: qty 100

## 2016-07-14 MED ORDER — ALBUTEROL SULFATE HFA 108 (90 BASE) MCG/ACT IN AERS: 1.0000 | INHALATION_SPRAY | Freq: Once | RESPIRATORY_TRACT | Status: DC

## 2016-07-14 MED ORDER — ENOXAPARIN SODIUM 40 MG/0.4ML ~~LOC~~ SOLN
40.0000 mg | SUBCUTANEOUS | Status: DC
  Administered 2016-07-14 – 2016-07-15 (×2): 40 mg via SUBCUTANEOUS
  Filled 2016-07-14 (×2): qty 0.4

## 2016-07-14 MED ORDER — POTASSIUM CHLORIDE 10 MEQ/100ML IV SOLN
10.0000 meq | INTRAVENOUS | Status: DC
  Administered 2016-07-14 (×2): 10 meq via INTRAVENOUS
  Filled 2016-07-14 (×2): qty 100

## 2016-07-14 MED ORDER — ALBUTEROL SULFATE HFA 108 (90 BASE) MCG/ACT IN AERS
1.0000 | INHALATION_SPRAY | Freq: Once | RESPIRATORY_TRACT | Status: DC
Start: 2016-07-14 — End: 2016-07-14

## 2016-07-14 MED ORDER — ALBUTEROL SULFATE (2.5 MG/3ML) 0.083% IN NEBU: 2.5000 mg | INHALATION_SOLUTION | RESPIRATORY_TRACT | Status: DC | PRN

## 2016-07-14 NOTE — ED Triage Notes (Signed)
Pt. arrived with EMS from street ( homeless) reports generalized body aches and pain wiith left chest pain radiating to left shoulder and left upper arm onset last night , mild SOB , no nausea or diaphoresis .

## 2016-07-14 NOTE — ED Notes (Signed)
Pt. Transported to CT at this time.  

## 2016-07-14 NOTE — Evaluation (Signed)
Physical Therapy Evaluation Patient Details Name: Brittany Shannon MRN: [id number] DOB: February 14, 1971 Today's Date: 07/14/2016   History of Present Illness  45 year old female w/ PMHx of hemoglobin [place] disease, HTN, sensorineural hearling loss, hx of splenectomy, and asthma who presents with chest pain   Clinical Impression  Pt presents as independent with bed mobility, transfers and gait. No acute care PT needs identified at this time.    Follow Up Recommendations No PT follow up    Equipment Recommendations  None recommended by PT    Recommendations for Other Services       Precautions / Restrictions Restrictions Weight Bearing Restrictions: No      Mobility  Bed Mobility Overal bed mobility: Independent                Transfers Overall transfer level: Independent                  Ambulation/Gait Ambulation/Gait assistance: Independent   Assistive device: None       General Gait Details: able to gait 20' x 2 without LOB  Stairs            Wheelchair Mobility    Modified Rankin (Stroke Patients Only)       Balance                                             Pertinent Vitals/Pain Pain Assessment: No/denies pain    Home Living Family/patient expects to be discharged to:: Private residence                      Prior Function Level of Independence: Independent               Hand Dominance        Extremity/Trunk Assessment   Upper Extremity Assessment: Generalized weakness           Lower Extremity Assessment: Generalized weakness      Cervical / Trunk Assessment: Normal  Communication   Communication: Deaf (reads lips)  Cognition Arousal/Alertness: Awake/alert Behavior During Therapy: WFL for tasks assessed/performed Overall Cognitive Status: Within Functional Limits for tasks assessed                      General Comments      Exercises        Assessment/Plan    PT  Assessment Patent does not need any further PT services  PT Diagnosis     PT Problem List    PT Treatment Interventions     PT Goals (Current goals can be found in the Care Plan section) Acute Rehab PT Goals PT Goal Formulation: All assessment and education complete, DC therapy    Frequency     Barriers to discharge        Co-evaluation               End of Session   Activity Tolerance: Patient tolerated treatment well Patient left: in bed;with call bell/phone within reach;with bed alarm set Nurse Communication: Mobility status    Functional Assessment Tool Used: clinical judgement Functional Limitation: Mobility: Walking and moving around Mobility: Walking and Moving Around Current Status [id number]): 0 percent impaired, limited or restricted Mobility: Walking and Moving Around Goal Status [id number]): 0 percent impaired, limited or restricted Mobility: Walking and Moving Around Discharge Status [phone]):  0 percent impaired, limited or restricted    Time: [id number] PT Time Calculation (min) (ACUTE ONLY): 12 min   Charges:   PT Evaluation $PT Eval Low Complexity: 1 Procedure     PT G Codes:   PT G-Codes **NOT FOR INPATIENT CLASS** Functional Assessment Tool Used: clinical judgement Functional Limitation: Mobility: Walking and moving around Mobility: Walking and Moving Around Current Status [id number]): 0 percent impaired, limited or restricted Mobility: Walking and Moving Around Goal Status [id number]): 0 percent impaired, limited or restricted Mobility: Walking and Moving Around Discharge Status [id number]): 0 percent impaired, limited or restricted    [name] 07/14/2016, 1:22 PM

## 2016-07-14 NOTE — ED Notes (Signed)
Pt. assisted to sit on wheelchair , pt. was found by radiology tech lying on the floor pale/diaphoretic and  lethargic  Charge nurse notified ,and will move pt. to next available bed.

## 2016-07-14 NOTE — ED Notes (Signed)
Went to round on pt and patients oxygen sats were 81% on RA while resting with a perfect waveform. Pt was woken up and placed on 3L of oxygen. Pt sats up to 100%.

## 2016-07-14 NOTE — H&P (Signed)
Date: 07/14/2016               Patient Name:  Brittany Shannon MRN: [id number]  DOB: 1971-03-04 Age / Sex: 45 y.o., female   PCP: Provider Not In System         Medical Service: Internal Medicine Teaching Service         Attending Physician: Dr. Axel Filler, MD    First Contact: Dr. Holley Raring Pager: [phone]  Second Contact: Dr. Charlott Rakes Pager: [phone]       After Hours (After 5p/  First Contact Pager: [phone]  weekends / holidays): Second Contact Pager: [phone]   Chief Complaint: chest pain  History of Present Illness: Pt is a 45 year old female w/ PMHx of hemoglobin [place] disease, HTN, sensorineural hearling loss, hx of splenectomy, and asthma who presents with chest pain that occurred around 7:00 am this morning (although per ED provider it was 6 hours prior to ED arrival). Pt wears a cochlear implant that she is not currently using due to low batteries, information was obtained via written questions; pt can also lip read. Chest pain was located over left chest wall with radiation down left arm, back, and left side of neck. She was walking when chest pain started and she experienced diaphoresis, nausea, and SOB associated w/ chest pain. When she noticed chest pain she sat down and then reports falling down slowly onto the ground due to weakness and fell asleep. She states chest pain lasted 5-10 minutes and was a squeezing sensation that was 7/10 in severity. When she woke up chest pain was gone, she also remembered all of the events leading up to her fall. She denies any confusion when waking up, she believes she may have been asleep for 10 minutes. She then got back [place] and [place] was at. EMS then brought pt to ED from there.   In the ED she was given IV morphine for pain, point of care troponin was negative, CXR negative, and EKG negative for ischemia. Pt denies any current chest pain, she denies hx of heart disease or pain  similar to today's presentation. She was noted to have an admission in 2014 for sickle cell pain crisis. She follows with hematology at [facility] and was last seen on 05/05/16. She was on hydroxyurea 1000mg  daily at that time which she is no longer taking b/c she ran out. She takes oxycodone 5mg  q6h prn for pain at home but states her pill bottles are in someone's car. She also notes that she has not required oxycodone as she has not had any pain for a while until today.   While in the ED pt was noted to become hypoxic down to 81% on RA that went back up to 100% on 3L [place]. D-dimer was elevated at 2.07, CTA chest obtained was negative for PE, positive for small pericardial effusion, and minimal right lower lobe scarring. During exam pt maintained O2 sats around 91%. She states that other than this morning's episode of chest pain she has not had DOE or SOB. She has asthma and has not been using her albuterol inhaler b/c she ran out of it.   She states she feels dehydrated as she has not had anything to eat since breakfast this morning. Denies diarrhea, dysuria, vomiting, and current weakness. She has not done more physical exertion that normal, denies heavy lifting, and chest wall trauma.  She has been eating and drinking well prior to admission, denies sick contacts.    Meds:  Prescriptions Prior to Admission  Medication Sig Dispense Refill Last Dose  . folic acid (FOLVITE) 1 MG tablet Take 1 mg by mouth daily.   2 months ago  . oxycodone (OXY-IR) 5 MG capsule Take 5 mg by mouth every 6 (six) hours as needed for pain.   unknown  . HYDROcodone-acetaminophen (NORCO/VICODIN) 5-325 MG per tablet Take 1 tablet by mouth every 4 (four) hours as needed. 10 tablet 0   . lisinopril (PRINIVIL) 10 MG tablet Take 1 tablet (10 mg total) by mouth daily. 30 tablet 0 2 months ago  . traMADol (ULTRAM) 50 MG tablet Take 1 tablet (50 mg total) by mouth every 6 (six) hours as needed. 15 tablet 0      Allergies: Allergies as of  07/14/2016  . (No Known Allergies)   Past Medical History:  Diagnosis Date  . Asthma   . 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 Homelessness   . Hypertension   . Sickle cell anemia (HCC)     Family History: CVA-- mom and dad, sickle cell trait-- daughter and 2 sons  Social History: pt is not currently working, she previously worked at [employer] She denies EtOH use. She smokes 1 pack every 2 days. She states that she is going to take the bus Monday to move to [place], [place].   Review of Systems: A complete ROS was negative except as per HPI.   Physical Exam: Blood pressure 93/63, pulse 92, temperature 99.3 F (37.4 C), temperature source Oral, resp. rate 20, height 5\' 9"  (1.753 m), SpO2 (!) 9 %. Physical Exam  Constitutional: She appears well-developed and well-nourished. No distress.  HENT:  Head: Normocephalic and atraumatic.  Nose: Nose normal.  Mouth/Throat: Oropharynx is clear and moist. No oropharyngeal exudate.  Cardiovascular: Normal rate, regular rhythm and normal heart sounds.  Exam reveals no gallop and no friction rub.   No murmur heard. Pulmonary/Chest: Effort normal and breath sounds normal. No respiratory distress. She has no wheezes. She has no rales. She exhibits tenderness (TTP of left chest wall the reproduces CP she had this morning).  Abdominal: Soft. Bowel sounds are normal. She exhibits no distension and no mass. There is tenderness (TTP of LUQ). There is no rebound.  Musculoskeletal: She exhibits no edema.  Skin: Skin is warm and dry. No rash noted. She is not diaphoretic. No erythema. No pallor.  Skin tenting      EKG: EKG Interpretation  Date/Time:  Friday July 14 2016 06:11:44 EDT Ventricular Rate:  87 PR Interval:    QRS Duration: 90 QT Interval:  402 QTC Calculation: 484 R Axis:   78 Text Interpretation:  Sinus rhythm Left ventricular hypertrophy No significant change was found Confirmed by CAMPOS  MD, KEVIN ([id number]) on 07/14/2016 6:55:58 AM  CXR:  negative for active cardiopulmonary dz  Assessment & Plan by Problem: Principal Problem:   Chest pain Active Problems:   Hemoglobin S-C disease (HCC)   Asthma   HTN (hypertension)  Chest pain-- pt having left sided squeezing chest pain that started on exertion. She felt weak, diaphoretic, nauseous, and had SOB. However, EKG and troponin negative. She is not having any chest pain currently. On exam she was TTP of left chest wall that she reports feels like the same type of chest pain she had this morning, thus likely CP is MSK vs from acute sickle cell crisis.  Sickle cell crisis could have been exacerbated by dehydration as she reports feeling dehydrated and had skin tenting on exam.  Her hgb is relatively stable at 8.4 from b/l of 8.8-9.3. CXR was negative for any infiltrate, thus likely this is not an acute chest syndrome. CTA chest negative for PE.   - admit to tele - morning fasting lipid panel - oxy 5mg  q4h prn for pain - checking orthostatics - D5 1/2 NS at 125 cc/hr x 12 hours - PT / OT consulted  Hemoglobin [place] disease-- follows w/ [facility] hematology. Previously on hydroxyurea 1000mg  daily however she stopped taking it after she ran out. On oxycodone 5mg  q6h prn for pain at home. hgb was last 9.3 on 05/05/16 and 8.8 on 03/31/16. On admission hgb was 8.4. Retic count elevated at 7.8.  - continue oxycodone 5mg  q4h prn for pain - CBC in the am - ordered LDH - will need to follow up with hematology clinic, was suppose to f/u in June.  Hypokalemia-- K 2.7 on presentation, given KDur 44mEq and one run of KCl in the ED. - ordered additional 4 runs of KCl  - CMET at 2:00pm - checking mag  Hypoxia in setting of Asthma-- denies having PFTs done. She is on albuterol at home.  Noted to be hypoxic down to 81% that resolved w/ oxygen. On exam she was sating around 91% w/o any SOB or dyspnea. No wheezing on exam, pt requesting albuterol inhaler. CTA chest negative for PE.  - ordered albuterol inhaler  once now then continue albuterol q4h prn   HTN--on lisinopril- HCTZ 10-12.5 at home. BP low nl, will hold home anti hypertensives for now.   Dispo: Admit patient to Observation with expected length of stay less than 2 midnights.  Signed: Norman Herrlich, MD 07/14/2016, 11:40 AM  Pager: [phone]

## 2016-07-14 NOTE — Progress Notes (Signed)
Heart rate increased to 150's briefly.  Dr.  Aurelio Brash notified. No new orders received.

## 2016-07-14 NOTE — ED Notes (Signed)
Called lab to add on retic count

## 2016-07-14 NOTE — ED Provider Notes (Signed)
[facility] DEPT Provider Note   CSN: [id number] Arrival date & time: 07/14/16  0447  First Provider Contact:  None       History   Chief Complaint Chief Complaint  Patient presents with  . Chest Pain    HPI Brittany Shannon is a 45 y.o. female.  Patient presents to the ED with a chief complaint of chest pain.  She states that the symptoms started 6 hours ago.  She is deaf and cannot use ASL.  History is obtained by writing questions, to which she verbally responds.  Denies fever, SOB, diaphoresis, nausea, vomiting, radiating pain.  There are no other associated symptoms.   The history is provided by the patient. No language interpreter was used.    Past Medical History:  Diagnosis Date  . Asthma   . 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 Homelessness   . Hypertension   . Sickle cell anemia (HCC)     There are no active problems to display for this patient.   Past Surgical History:  Procedure Laterality Date  . CHOLECYSTECTOMY    . EYE SURGERY    . SPLENECTOMY, TOTAL      OB History    No data available       Home Medications    Prior to Admission medications   Medication Sig Start Date End Date Taking? Authorizing Provider  acetaminophen (TYLENOL) 325 MG tablet Take 650 mg by mouth every 6 (six) hours as needed for mild pain or moderate pain.    Historical Provider, MD  albuterol (PROVENTIL HFA;VENTOLIN HFA) 108 (90 BASE) MCG/ACT inhaler Inhale 2 puffs into the lungs every 6 (six) hours as needed for wheezing.    Historical Provider, MD  Aspirin-Caffeine (BC FAST PAIN RELIEF PO) Take 1 Package by mouth every 6 (six) hours as needed (for pain).    Historical Provider, MD  HYDROcodone-acetaminophen (NORCO/VICODIN) 5-325 MG per tablet Take 1 tablet by mouth every 4 (four) hours as needed. 06/21/14   Larene Pickett, PA-C  lisinopril (PRINIVIL) 10 MG tablet Take 1 tablet (10 mg total) by mouth daily. 06/21/14   Larene Pickett, PA-C  traMADol (ULTRAM) 50 MG tablet Take 1 tablet  (50 mg total) by mouth every 6 (six) hours as needed. 08/04/15   Junius Creamer, NP    Family History No family history on file.  Social History Social History  Substance Use Topics  . Smoking status: Current Every Day Smoker    Types: Cigarettes  . Smokeless tobacco: Not on file  . Alcohol use No     Allergies   Review of patient's allergies indicates no known allergies.   Review of Systems Review of Systems  Respiratory: Negative for cough and shortness of breath.   Cardiovascular: Positive for chest pain.  All other systems reviewed and are negative.    Physical Exam Updated Vital Signs BP 94/70   Pulse 82   Temp 97.6 F (36.4 C) (Oral)   Resp 18   SpO2 93%   Physical Exam  Constitutional: She is oriented to person, place, and time. She appears well-developed and well-nourished.  HENT:  Head: Normocephalic and atraumatic.  Eyes: Conjunctivae and EOM are normal. Pupils are equal, round, and reactive to light.  Neck: Normal range of motion. Neck supple.  Cardiovascular: Normal rate and regular rhythm.  Exam reveals no gallop and no friction rub.   No murmur heard. Pulmonary/Chest: Effort normal and breath sounds normal. No respiratory distress. She has no  wheezes. She has no rales. She exhibits no tenderness.  Abdominal: Soft. Bowel sounds are normal. She exhibits no distension and no mass. There is no tenderness. There is no rebound and no guarding.  Musculoskeletal: Normal range of motion. She exhibits no edema or tenderness.  Neurological: She is alert and oriented to person, place, and time.  Skin: Skin is warm and dry.  Psychiatric: She has a normal mood and affect. Her behavior is normal. Judgment and thought content normal.  Nursing note and vitals reviewed.    ED Treatments / Results  Labs (all labs ordered are listed, but only abnormal results are displayed) Labs Reviewed  BASIC METABOLIC PANEL - Abnormal; Notable for the following:       Result Value    Potassium 2.7 (*)    Glucose, Bld 103 (*)    BUN 28 (*)    Creatinine, Ser 1.75 (*)    Calcium 10.4 (*)    GFR calc non Af Amer 34 (*)    GFR calc Af Amer 39 (*)    All other components within normal limits  CBG MONITORING, ED - Abnormal; Notable for the following:    Glucose-Capillary 137 (*)    All other components within normal limits  CBC  I-STAT TROPOININ, ED    EKG  EKG Interpretation None       Radiology Dg Chest 2 View  Result Date: 07/14/2016 CLINICAL DATA:  45 year old female with chest pain EXAM: CHEST  2 VIEW COMPARISON:  Chest radiograph dated 11/26/2013- FINDINGS: The heart size and mediastinal contours are within normal limits. Both lungs are clear. The visualized skeletal structures are unremarkable. IMPRESSION: No active cardiopulmonary disease. Electronically Signed   By: Anner Crete M.D.   On: 07/14/2016 05:35    Procedures Procedures (including critical care time)  Medications Ordered in ED Medications  morphine 4 MG/ML injection 4 mg (not administered)  ondansetron (ZOFRAN) injection 4 mg (not administered)  potassium chloride SA (K-DUR,KLOR-CON) CR tablet 40 mEq (not administered)  potassium chloride 10 mEq in 100 mL IVPB (not administered)     Initial Impression / Assessment and Plan / ED Course  I have reviewed the triage vital signs and the nursing notes.  Pertinent labs & imaging results that were available during my care of the patient were reviewed by me and considered in my medical decision making (see chart for details).  Clinical Course    Patient with chest pain x 6 hours.  Hx of sickle cell and hypertension.  Will check labs and reassess.  K is low.  Will replace.  Cr is elevated, will give fluids.  Patient discussed with Dr. Venora Maples, who recommends d-dimer, delta trop and EKG.    Notified by nursing staff that patient O2 sat is dropping.  78-81% on room air.  No requiring 3L [place] O2.    PE study negative.    Mild creatinine  increase.  Leukocytosis to 13.6.  Afebrile.    Patient discussed with Dr. Ellender Hose, who recommends admission based on the fact that patient has become hypoxic.    Patient does not have a PCP.  She is followed for Sickle Cell in [facility].  Final Clinical Impressions(s) / ED Diagnoses   Final diagnoses:  Chest pain, unspecified chest pain type  Hypoxia    New Prescriptions New Prescriptions   No medications on file     Montine Circle, PA-C 07/14/16 Powers, MD 07/14/16 [id number]

## 2016-07-14 NOTE — ED Notes (Signed)
Phlebotomy contacted about losing purple top for patient. Will draw another lab with IV start.

## 2016-07-14 NOTE — ED Notes (Signed)
PT. ambulated to restroom with no issues.

## 2016-07-15 DIAGNOSIS — R5081 Fever presenting with conditions classified elsewhere: Secondary | ICD-10-CM | POA: Diagnosis not present

## 2016-07-15 DIAGNOSIS — D57219 Sickle-cell/Hb-C disease with crisis, unspecified: Secondary | ICD-10-CM | POA: Diagnosis not present

## 2016-07-15 DIAGNOSIS — D72829 Elevated white blood cell count, unspecified: Secondary | ICD-10-CM

## 2016-07-15 DIAGNOSIS — R0902 Hypoxemia: Secondary | ICD-10-CM

## 2016-07-15 DIAGNOSIS — Z9081 Acquired absence of spleen: Secondary | ICD-10-CM

## 2016-07-15 DIAGNOSIS — R079 Chest pain, unspecified: Secondary | ICD-10-CM | POA: Diagnosis not present

## 2016-07-15 LAB — BASIC METABOLIC PANEL
ANION GAP: 6 (ref 5–15)
BUN: 17 mg/dL (ref 6–20)
CALCIUM: 9.4 mg/dL (ref 8.9–10.3)
CO2: 26 mmol/L (ref 22–32)
CREATININE: 0.94 mg/dL (ref 0.44–1.00)
Chloride: 106 mmol/L (ref 101–111)
GFR calc Af Amer: >60 mL/min (ref >60)
Glucose, Bld: 117 mg/dL — ABNORMAL HIGH (ref 65–99)
Potassium: 3.2 mmol/L — ABNORMAL LOW (ref 3.5–5.1)
Sodium: 138 mmol/L (ref 135–145)

## 2016-07-15 LAB — CBC
HCT: 18.3 % — ABNORMAL LOW (ref 36.0–46.0)
HEMATOCRIT: 18.1 % — AB (ref 36.0–46.0)
Hemoglobin: 6.7 g/dL — CL (ref 12.0–15.0)
Hemoglobin: 6.6 g/dL — CL (ref 12.0–15.0)
MCH: 32.8 pg (ref 26.0–34.0)
MCH: 33 pg (ref 26.0–34.0)
MCHC: 36.6 g/dL — AB (ref 30.0–36.0)
MCHC: 36.5 g/dL — AB (ref 30.0–36.0)
MCV: 89.7 fL (ref 78.0–100.0)
MCV: 90.5 fL (ref 78.0–100.0)
PLATELETS: 259 10*3/uL (ref 150–400)
PLATELETS: 260 10*3/uL (ref 150–400)
RBC: 2.04 MIL/uL — ABNORMAL LOW (ref 3.87–5.11)
RBC: 2 MIL/uL — ABNORMAL LOW (ref 3.87–5.11)
RDW: 17.4 % — ABNORMAL HIGH (ref 11.5–15.5)
RDW: 17.5 % — AB (ref 11.5–15.5)
WBC: 16.5 10*3/uL — AB (ref 4.0–10.5)
WBC: 13.9 10*3/uL — AB (ref 4.0–10.5)

## 2016-07-15 LAB — PREPARE RBC (CROSSMATCH)

## 2016-07-15 LAB — RETICULOCYTES
RBC.: 2.09 MIL/uL — AB (ref 3.87–5.11)
RETIC COUNT ABSOLUTE: 215.3 10*3/uL — AB (ref 19.0–186.0)
RETIC CT PCT: 10.3 % — AB (ref 0.4–3.1)

## 2016-07-15 LAB — LIPID PANEL
Cholesterol: 123 mg/dL (ref 0–200)
HDL: 31 mg/dL — AB (ref >40)
LDL CALC: 76 mg/dL (ref 0–99)
Total CHOL/HDL Ratio: 4 RATIO
Triglycerides: 79 mg/dL (ref <150)
VLDL: 16 mg/dL (ref 0–40)

## 2016-07-15 LAB — LACTATE DEHYDROGENASE: LDH: 316 U/L — AB (ref 98–192)

## 2016-07-15 MED ORDER — POTASSIUM CHLORIDE CRYS ER 20 MEQ PO TBCR
40.0000 meq | EXTENDED_RELEASE_TABLET | Freq: Once | ORAL | Status: AC
  Administered 2016-07-15: 40 meq via ORAL
  Filled 2016-07-15: qty 2

## 2016-07-15 MED ORDER — SODIUM CHLORIDE 0.9 % IV SOLN
Freq: Once | INTRAVENOUS | Status: AC
  Administered 2016-07-16: 10 mL/h via INTRAVENOUS

## 2016-07-15 NOTE — Clinical Social Work Note (Signed)
CSW went to patient's room for assessment. Patient sleeping and did not arouse to [place] calling her name twice. Will come back later today or tomorrow.  Dayton Scrape, [place]

## 2016-07-15 NOTE — Progress Notes (Signed)
Internal Medicine Attending:   I saw and examined the patient. I reviewed the resident's note and I agree with the resident's findings and plan as documented in the resident's note.  Chest pain is essentially resolved at this point. Likely from a mild pain crisis. I am concerned about the rising leukocytosis, declining hemoglobin, and fever curve that is borderline. 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 I think a transfusion is indicated given persistent hypoxia with ambulation, down to 88%. Please transfuse one unit pRBC tonight.

## 2016-07-15 NOTE — Progress Notes (Signed)
SATURATION QUALIFICATIONS: (This note is used to comply with regulatory documentation for home oxygen)  Patient Saturations on Room Air at Rest = 93%  Patient Saturations on Room Air while Ambulating = 88-99%  Patient Saturations on N/A Liters of oxygen while Ambulating = N/A  Please briefly explain why patient needs home oxygen:N/A  07/15/16  Ericka Pontiff, RN

## 2016-07-15 NOTE — Progress Notes (Signed)
   Subjective: Currently, the patient is feeling well. She denies CP today. She was offered transfusion and declined. No complaints.  Interval Events: No hypoxia ON. Pain controlled.  Objective: Vital signs in last 24 hours: Vitals:   07/14/16 1134 07/14/16 1320 07/14/16 2023 07/15/16 1030  BP: 93/63  116/61   Pulse: 92  94   Resp:   18   Temp: 99.3 F (37.4 C)  99.7 F (37.6 C)   TempSrc: Oral  Oral   SpO2: 94% 97% 96% 93%  Height: 5\' 9"  (1.753 m)      Physical Exam: Physical Exam  Constitutional: She appears well-developed. She is cooperative. No distress.  Cardiovascular: Normal rate, regular rhythm, normal heart sounds and normal pulses.  Exam reveals no gallop.   No murmur heard. Pulmonary/Chest: Effort normal and breath sounds normal. No respiratory distress. Breasts are symmetrical.  Abdominal: Soft. Bowel sounds are normal. There is no tenderness.  Musculoskeletal: She exhibits no edema.   Labs: CBC:  Recent Labs Lab 07/14/16 0812 07/15/16 0412  WBC 13.6* 16.5*  HGB 8.4* 6.7*  HCT 22.9* 18.3*  MCV 90.5 89.7  PLT 288 259    Recent Labs Lab 07/14/16 1423 07/15/16 0941  LDH Q000111Q* [id number]*   Metabolic Panel:  Recent Labs Lab 07/14/16 0506 07/14/16 1423 07/15/16 0412  NA 138 141 138  K 2.7* 3.5 3.2*  CL 101 108 106  CO2 25 26 26   GLUCOSE 103* 123* 117*  BUN 28* 26* 17  CREATININE 1.75* 1.49* 0.94  CALCIUM 10.4* 9.6 9.4  MG  --  2.2  --   ALT  --  21  --   ALKPHOS  --  41  --   BILITOT  --  3.1*  --   PROT  --  7.6  --   ALBUMIN  --  4.1  --    Cardiac Labs:  Recent Labs Lab 07/14/16 0519  TROPIPOC 0.00   BG:  Recent Labs Lab 07/14/16 0611  GLUCAP 137*    Microbiology: BCx/UCx pending   Medications: Infusions:   Scheduled Medications: . albuterol  2.5 mg Nebulization Once  . enoxaparin (LOVENOX) injection  40 mg Subcutaneous Q24H   PRN Medications: albuterol, oxyCODONE  Assessment/Plan: Pt is a 45 y.o. yo female with a  PMHx of Sickle Cell [place] who was admitted on 07/14/2016 with symptoms of CP and hypoxic episode, which was determined to be secondary to mild pain crisis.   Hgb [place] Dz: Mild pain crisis w/ pain controlled. Hgb 6.7 but pt declines transfusion. Not hypoxic with ambulation and sating well on RA. LDH 316 from 269 with slight bump in retic ct possible 2/2 dilution. - transfuse for hypoxia or Hgb decrease w/ goal of >7 - manage pain Oxy q4h, consider morphine push for break through - trend CBC, LDH, Retic ct in AM  Leukocytosis in asplenic pt: WBC increase from 13 to 16 with 99.7 temp s/p splenectomy. Not tachycardic, BP stable. Concern for early infx. Will watch closely. - BCx/UCx pending - add Vanc/Ceftriaxone for empiric encapsulated org coverage  Length of Stay: 0 day(s) Dispo: Anticipated discharge in approximately 1-2 day(s).  Holley Raring, MD Pager: [phone] (7AM-5PM) 07/15/2016, 1:53 PM

## 2016-07-15 NOTE — Progress Notes (Signed)
CRITICAL VALUE ALERT  Critical value received:  Hgb 6.7 Date of notification:  07/15/2016 Time of notification:  0618  Critical value read back:Yes.    Nurse who received alert:  Estanislado Pandy  MD notified (1st page):  Dr. Arcelia Jew  Time of first page:  0620  MD notified (2nd page):  Time of second page:  Responding MD:  Dr. Arcelia Jew  Time MD responded:  [phone]

## 2016-07-16 DIAGNOSIS — R079 Chest pain, unspecified: Secondary | ICD-10-CM | POA: Diagnosis not present

## 2016-07-16 LAB — CBC
HCT: 21.6 % — ABNORMAL LOW (ref 36.0–46.0)
Hemoglobin: 7.8 g/dL — ABNORMAL LOW (ref 12.0–15.0)
MCH: 32.1 pg (ref 26.0–34.0)
MCHC: 36.1 g/dL — ABNORMAL HIGH (ref 30.0–36.0)
MCV: 88.9 fL (ref 78.0–100.0)
PLATELETS: 250 10*3/uL (ref 150–400)
RBC: 2.43 MIL/uL — ABNORMAL LOW (ref 3.87–5.11)
RDW: 16.5 % — AB (ref 11.5–15.5)
WBC: 11.2 10*3/uL — ABNORMAL HIGH (ref 4.0–10.5)

## 2016-07-16 LAB — URINE CULTURE

## 2016-07-16 LAB — LACTATE DEHYDROGENASE: LDH: 261 U/L — ABNORMAL HIGH (ref 98–192)

## 2016-07-16 LAB — RETICULOCYTES
RBC.: 2.43 MIL/uL — ABNORMAL LOW (ref 3.87–5.11)
Retic Count, Absolute: 240.6 10*3/uL — ABNORMAL HIGH (ref 19.0–186.0)
Retic Ct Pct: 9.9 % — ABNORMAL HIGH (ref 0.4–3.1)

## 2016-07-16 LAB — ABO/RH: ABO/RH(D): A POS

## 2016-07-16 LAB — HAPTOGLOBIN

## 2016-07-16 MED ORDER — OXYCODONE HCL 5 MG PO TABS: 5.0000 mg | ORAL_TABLET | Freq: Four times a day (QID) | ORAL | 0 refills | Status: DC | PRN

## 2016-07-16 NOTE — Progress Notes (Signed)
Occupational Therapy Evaluation/Discharge Patient Details Name: Brittany Shannon MRN: [id number] DOB: Jul 12, 1971 Today's Date: 07/16/2016    History of Present Illness 45 year old female w/ PMHx of hemoglobin [place] disease, HTN, sensorineural hearling loss, hx of splenectomy, and asthma who presents with chest pain    Clinical Impression   Patient has been evaluated by Occupational Therapy with no acute OT needs identified. Pt reporting 6/10 headache pain, but was able to complete ADLs and basic transfers with modified independence. 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 Pt reads lips well and therapist used whiteboard to communicate. Pt is currently staying with a friend and plans to move to [facility] (plan was for tomorrow) to live with her daughter. Pt does not have any further acute OT needs. OT signing off. Thank you for this referral.    Follow Up Recommendations  No OT follow up    Equipment Recommendations  None recommended by OT    Recommendations for Other Services       Precautions / Restrictions Precautions Precautions: None Restrictions Weight Bearing Restrictions: No      Mobility Bed Mobility Overal bed mobility: Modified Independent             General bed mobility comments: Increased time and effort due to lethargy and headache pain  Transfers Overall transfer level: Modified independent Equipment used: None             General transfer comment: No LOB observed or dizziness reported.    Balance Overall balance assessment: No apparent balance deficits (not formally assessed)                                          ADL Overall ADL's : Modified independent                                       General ADL Comments: Increased time and effort. No LOB or unsafe behaviors demonstrated.     Vision Vision Assessment?: No apparent visual deficits   Perception     Praxis      Pertinent  Vitals/Pain Pain Assessment: 0-10 Pain Score: 6  Pain Location: headache Pain Descriptors / Indicators: Headache Pain Intervention(s): Limited activity within patient's tolerance;Monitored during session;Patient requesting pain meds-RN notified     Hand Dominance Right   Extremity/Trunk Assessment Upper Extremity Assessment Upper Extremity Assessment: Generalized weakness   Lower Extremity Assessment Lower Extremity Assessment: Generalized weakness   Cervical / Trunk Assessment Cervical / Trunk Assessment: Normal   Communication Communication Communication: Deaf (has bilateral cochlear implants, but not wearing them)   Cognition Arousal/Alertness: Lethargic Behavior During Therapy: Flat affect Overall Cognitive Status: Within Functional Limits for tasks assessed                     General Comments       Exercises       Shoulder Instructions      Home Living Family/patient expects to be discharged to:: Unsure                                 Additional Comments: Pt is currently staying with a friend. She plans to catch the bus tomorrow (  8/7) and move to [place], [place] to live with her daughter.       Prior Functioning/Environment Level of Independence: Independent        Comments: Does not drive    OT Diagnosis: Generalized weakness;Acute pain   OT Problem List: Decreased strength;Decreased activity tolerance;Pain   OT Treatment/Interventions:      OT Goals(Current goals can be found in the care plan section) Acute Rehab OT Goals Patient Stated Goal: to go to [facility] tomorrow OT Goal Formulation: With patient Time For Goal Achievement: 07/30/16 Potential to Achieve Goals: Good  OT Frequency:     Barriers to D/C:            Co-evaluation              End of Session Equipment Utilized During Treatment: Gait belt Nurse Communication: Mobility status;Patient requests pain meds  Activity Tolerance: Patient limited by pain Patient  left: in bed;with call bell/phone within reach   Time: 0915-0926 OT Time Calculation (min): 11 min Charges:  OT General Charges $OT Visit: 1 Procedure OT Evaluation $OT Eval Low Complexity: 1 Procedure G-Codes: OT G-codes **NOT FOR INPATIENT CLASS** Functional Assessment Tool Used: clinical judgement Functional Limitation: Self care Self Care Current Status [id number]): 0 percent impaired, limited or restricted Self Care Goal Status [id number]): 0 percent impaired, limited or restricted Self Care Discharge Status [id number]): 0 percent impaired, limited or restricted  Redmond Baseman, OTR/L Pager: [phone] 07/16/2016, 10:06 AM

## 2016-07-16 NOTE — Discharge Summary (Signed)
Name: Brittany Shannon MRN: [id number] DOB: [birth year] 45 y.o. PCP: Provider Not In System  Date of Admission: 07/14/2016  6:06 AM Date of Discharge: 07/17/2016 Attending Physician: No att. providers found  Discharge Diagnosis: 1. Sickle Cell Pain Crisis  Principal Problem:   Chest pain Active Problems:   Hemoglobin S-C disease (HCC)   Asthma   HTN (hypertension)   Discharge Medications:   Medication List    STOP taking these medications   HYDROcodone-acetaminophen 5-325 MG tablet Commonly known as:  NORCO/VICODIN   lisinopril 10 MG tablet Commonly known as:  PRINIVIL   oxycodone 5 MG capsule Commonly known as:  OXY-IR Replaced by:  oxyCODONE 5 MG immediate release tablet   traMADol 50 MG tablet Commonly known as:  ULTRAM     TAKE these medications   folic acid 1 MG tablet Commonly known as:  FOLVITE Take 1 mg by mouth daily.   oxyCODONE 5 MG immediate release tablet Commonly known as:  Oxy IR/ROXICODONE Take 1 tablet (5 mg total) by mouth every 6 (six) hours as needed for moderate pain. Replaces:  oxycodone 5 MG capsule       Disposition and follow-up:   [name] was discharged from [facility] in Stable condition.  At the hospital follow up visit please address:  1.  Hgb [place] Dz: Check H&H, assess symptoms and pain control  2.  Labs / imaging needed at time of follow-up: CBC  3.  Pending labs/ test needing follow-up: none  Follow-up Appointments: Follow-up Information    ATAGA, Diamond Nickel, MD. Schedule an appointment as soon as possible for a visit in 1 week(s).   Specialty:  Oncology Contact information: [place] [address] [facility] Course by problem list: Principal Problem:   Chest pain Active Problems:   Hemoglobin S-C disease (HCC)   Asthma   HTN (hypertension)   1. Pt was admitted for symptoms of atypical chest pain. She was determined to have an acute sickle cell pain  crisis. She was treated with pain medications and her symptoms resolved. She was also noted to have a mildly elevated temperature to 99.7 and an elevated WBC. We evaluated her w/ blood cultures for concern of infection given her asplenic state. Blood cultures were NGTD. She was also found to have low Hgb at 6.7. She was offered a transfusion but declined at this time. She was discharged in stable condition w/ follow up for Hematology.  Discharge Vitals:   BP 129/75 (BP Location: Left Arm)   Pulse 69   Temp 99.5 F (37.5 C) (Oral)   Resp 18   Ht 5\' 9"  (1.753 m)   Wt 106 lb 11.2 oz (48.4 kg)   SpO2 92%   BMI 15.76 kg/m   Pertinent Labs, Studies, and Procedures:  Negative troponins and EKG. Blood cultures NGTD. Hgb - 6.7  Discharge Instructions: Discharge Instructions    Activity as tolerated - No restrictions    Complete by:  As directed   Call MD for:  extreme fatigue    Complete by:  As directed   Call MD for:  persistant dizziness or light-headedness    Complete by:  As directed   Call MD for:  severe uncontrolled pain    Complete by:  As directed   Call MD for:  temperature >100.4    Complete by:  As directed   Diet - low sodium heart healthy  Complete by:  As directed     Signed: Holley Raring, MD 07/17/2016, 4:33 PM   Pager: @[phone] @

## 2016-07-16 NOTE — Progress Notes (Signed)
SATURATION QUALIFICATIONS: (This note is used to comply with regulatory documentation for home oxygen)  Patient Saturations on Room Air at Rest = 93%  Patient Saturations on Room Air while Ambulating = 91-94%

## 2016-07-16 NOTE — Progress Notes (Signed)
   Subjective: This morning, I communicated with her by way of writing. She denies difficulty breathing, nausea, vomiting, diarrhea, poor appetite. I conveyed to her our concern yesterday given her hypoxia with ambulation and low grade temperature and that we would repeat the test today.   Objective: Vital signs in last 24 hours: Vitals:   07/15/16 2100 07/16/16 0214 07/16/16 0255 07/16/16 0512  BP: 113/66 132/88 (!) 131/91 129/75  Pulse: 85 77 82 69  Resp: 16 18 18 18   Temp: 99 F (37.2 C) 99.1 F (37.3 C) 99.4 F (37.4 C) 99.5 F (37.5 C)  TempSrc:  Oral Oral Oral  SpO2: 93% 90% 92% 92%  Weight:    106 lb 11.2 oz (48.4 kg)  Height:       Physical Exam: Physical Exam  Constitutional: Thin African American female, no acute distress Cardiovascular: Normal rate, regular rhythm, normal heart sounds and normal pulses.  Exam reveals no gallop.   No murmur heard. Pulmonary/Chest: Effort normal and breath sounds normal. No respiratory distress.  Abdominal: Soft. Bowel sounds are normal. There is no tenderness.  Musculoskeletal: She exhibits no edema.   Labs: CBC:  Recent Labs Lab 07/14/16 0812 07/15/16 0412 07/15/16 1256 07/16/16 0553  WBC 13.6* 16.5* 13.9* 11.2*  HGB 8.4* 6.7* 6.6* 7.8*  HCT 22.9* 18.3* 18.1* 21.6*  MCV 90.5 89.7 90.5 88.9  PLT 288 259 260 250    Recent Labs Lab 07/14/16 1423 07/15/16 0941 07/16/16 0553  LDH 269* 123XX123* 0000000*   Metabolic Panel:  Recent Labs Lab 07/14/16 0506 07/14/16 1423 07/15/16 0412  NA 138 141 138  K 2.7* 3.5 3.2*  CL 101 108 106  CO2 25 26 26   GLUCOSE 103* 123* 117*  BUN 28* 26* 17  CREATININE 1.75* 1.49* 0.94  CALCIUM 10.4* 9.6 9.4  MG  --  2.2  --   ALT  --  21  --   ALKPHOS  --  41  --   BILITOT  --  3.1*  --   PROT  --  7.6  --   ALBUMIN  --  4.1  --    Cardiac Labs:  Recent Labs Lab 07/14/16 0519  TROPIPOC 0.00   BG:  Recent Labs Lab 07/14/16 0611  GLUCAP 137*    Microbiology: BCx/UCx pending    Medications: Infusions:   Scheduled Medications: . albuterol  2.5 mg Nebulization Once  . enoxaparin (LOVENOX) injection  40 mg Subcutaneous Q24H   PRN Medications: albuterol, oxyCODONE  Assessment/Plan: Pt is a 45 y.o. yo female with a Sickle Cell [place] who was admitted on 07/14/2016 with chest pain and hypoxic episode thought to be 2/2 mild pain crisis.  Hgb [place] Dz: Possibly mild pain crisis. Hb improved to 7.8 after pRBC x 1. Not hypoxic with ambulation and sating well on RA. LDH 261, down from 316 down in retic ct possible 2/2 dilution. -Prescribed Oxy 5mg  IR x 25 tablets per [place] DEA database since she received 50 tablets on 5/27 from [facility]  Leukocytosis in asplenic pt: WBC 11.9, down from 13.9 from yesterday, though low-grade temperature 53F. Cultures pending. Low suspicion for infection at this point.  Length of Stay: 1 day(s)  Dispo: Anticipated discharge today.  Riccardo Dubin, MD Pager: [phone] (7AM-5PM) 07/16/2016, 12:18 PM

## 2016-07-17 LAB — TYPE AND SCREEN
ABO/RH(D): A POS
ANTIBODY SCREEN: NEGATIVE
UNIT DIVISION: 0

## 2016-07-20 LAB — CULTURE, BLOOD (ROUTINE X 2)
CULTURE: NO GROWTH
Culture: NO GROWTH

## 2016-10-24 ENCOUNTER — Emergency Department: Payer: Medicare Other

## 2016-10-24 ENCOUNTER — Emergency Department
Admission: EM | Admit: 2016-10-24 | Discharge: 2016-10-24 | Disposition: A | Discharge status: Home / Self Care | Payer: Medicare Other | Attending: Emergency Medicine | Admitting: Emergency Medicine

## 2016-10-24 DIAGNOSIS — Z79899 Other long term (current) drug therapy: Secondary | ICD-10-CM | POA: Insufficient documentation

## 2016-10-24 DIAGNOSIS — I10 Essential (primary) hypertension: Secondary | ICD-10-CM | POA: Insufficient documentation

## 2016-10-24 DIAGNOSIS — G9389 Other specified disorders of brain: Secondary | ICD-10-CM | POA: Insufficient documentation

## 2016-10-24 DIAGNOSIS — F22 Delusional disorders: Secondary | ICD-10-CM

## 2016-10-24 DIAGNOSIS — Z046 Encounter for general psychiatric examination, requested by authority: Secondary | ICD-10-CM | POA: Diagnosis present

## 2016-10-24 DIAGNOSIS — J45909 Unspecified asthma, uncomplicated: Secondary | ICD-10-CM | POA: Insufficient documentation

## 2016-10-24 DIAGNOSIS — F17210 Nicotine dependence, cigarettes, uncomplicated: Secondary | ICD-10-CM | POA: Insufficient documentation

## 2016-10-24 LAB — COMPREHENSIVE METABOLIC PANEL
ALT: 16 U/L (ref 14–54)
AST: 26 U/L (ref 15–41)
Albumin: 4.6 g/dL (ref 3.5–5.0)
Alkaline Phosphatase: 55 U/L (ref 38–126)
Anion gap: 8 (ref 5–15)
BUN: 10 mg/dL (ref 6–20)
CHLORIDE: 104 mmol/L (ref 101–111)
CO2: 27 mmol/L (ref 22–32)
CREATININE: 0.71 mg/dL (ref 0.44–1.00)
Calcium: 10.1 mg/dL (ref 8.9–10.3)
GFR calc non Af Amer: >60 mL/min (ref >60)
Glucose, Bld: 94 mg/dL (ref 65–99)
POTASSIUM: 3.3 mmol/L — AB (ref 3.5–5.1)
SODIUM: 139 mmol/L (ref 135–145)
Total Bilirubin: 3.2 mg/dL — ABNORMAL HIGH (ref 0.3–1.2)
Total Protein: 8.5 g/dL — ABNORMAL HIGH (ref 6.5–8.1)

## 2016-10-24 LAB — URINALYSIS COMPLETE WITH MICROSCOPIC (ARMC ONLY)
Bilirubin Urine: NEGATIVE
Glucose, UA: NEGATIVE mg/dL
Hgb urine dipstick: NEGATIVE
Ketones, ur: NEGATIVE mg/dL
Leukocytes, UA: NEGATIVE
Nitrite: NEGATIVE
PROTEIN: NEGATIVE mg/dL
SPECIFIC GRAVITY, URINE: 1.01 (ref 1.005–1.030)
pH: 5 (ref 5.0–8.0)

## 2016-10-24 LAB — SALICYLATE LEVEL

## 2016-10-24 LAB — URINE DRUG SCREEN, QUALITATIVE (ARMC ONLY)
Amphetamines, Ur Screen: NOT DETECTED
BARBITURATES, UR SCREEN: NOT DETECTED
BENZODIAZEPINE, UR SCRN: NOT DETECTED
CANNABINOID 50 NG, UR ~~LOC~~: POSITIVE — AB
Cocaine Metabolite,Ur ~~LOC~~: NOT DETECTED
MDMA (Ecstasy)Ur Screen: NOT DETECTED
Methadone Scn, Ur: NOT DETECTED
OPIATE, UR SCREEN: NOT DETECTED
PHENCYCLIDINE (PCP) UR S: NOT DETECTED
Tricyclic, Ur Screen: NOT DETECTED

## 2016-10-24 LAB — CBC
HEMATOCRIT: 30.7 % — AB (ref 35.0–47.0)
Hemoglobin: 11.1 g/dL — ABNORMAL LOW (ref 12.0–16.0)
MCH: 33.6 pg (ref 26.0–34.0)
MCHC: 36.2 g/dL — AB (ref 32.0–36.0)
MCV: 92.6 fL (ref 80.0–100.0)
PLATELETS: 291 10*3/uL (ref 150–440)
RBC: 3.31 MIL/uL — ABNORMAL LOW (ref 3.80–5.20)
RDW: 16.4 % — AB (ref 11.5–14.5)
WBC: 11.2 10*3/uL — ABNORMAL HIGH (ref 3.6–11.0)

## 2016-10-24 LAB — ACETAMINOPHEN LEVEL: Acetaminophen (Tylenol), Serum: <10 ug/mL — ABNORMAL LOW (ref 10–30)

## 2016-10-24 LAB — ETHANOL: Alcohol, Ethyl (B): <5 mg/dL (ref <5)
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
## 2016-10-24 NOTE — ED Notes (Signed)
Pt returned from CT scan.

## 2016-10-24 NOTE — ED Notes (Signed)

## 2016-10-24 NOTE — ED Notes (Signed)
BEHAVIORAL HEALTH ROUNDING Patient sleeping: No. Patient alert and oriented: yes Behavior appropriate: Yes.  ; If no, describe:  Nutrition and fluids offered: yes Toileting and hygiene offered: Yes  Sitter present: q15 minute observations and security  monitoring Law enforcement present: Yes  ODS  

## 2016-10-24 NOTE — ED Notes (Signed)
Pt to CT scan.

## 2016-10-24 NOTE — ED Notes (Signed)
Mother in room visiting

## 2016-10-24 NOTE — Discharge Instructions (Signed)
You have been seen in the Emergency Department (ED)  today for psychiatric issues.  You have been evaluated by the behavioral medicine specialists and are being referred to: ° °[facility] °[address] °[place], [place] [address] °Phone:  [phone] or [phone] ° °Open Access:   °Walk-in ASSESSMENT hours, M-W-F, 8:00am - 3:00pm °Advanced Acess CRISIS:  M-F, 8:00am - 8:00pm °Outpatient Services Office Hours:  M-F, 8:00am - 5:00pm ° °Please return to the Emergency Department (ED)  immediately if you have ANY thoughts of hurting yourself or anyone else, so that we may help you. ° °Please avoid alcohol and drug use. ° °Follow up with your doctor and/or therapist as soon as possible regarding today's ED  visit.   Please follow up any other recommendations and clinic appointments provided by the psychiatry team that saw you in the Emergency Department. ° °
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
## 2016-10-24 NOTE — ED Notes (Signed)
Dr. Tawny Asal talking to patient

## 2016-11-03 ENCOUNTER — Emergency Department: Payer: Medicare Other

## 2016-11-03 ENCOUNTER — Encounter: Payer: Self-pay | Admitting: Emergency Medicine

## 2016-11-03 ENCOUNTER — Emergency Department
Admission: EM | Admit: 2016-11-03 | Discharge: 2016-11-03 | Disposition: A | Discharge status: Home / Self Care | Payer: Medicare Other | Attending: Emergency Medicine | Admitting: Emergency Medicine

## 2016-11-03 DIAGNOSIS — D5700 Hb-SS disease with crisis, unspecified: Secondary | ICD-10-CM | POA: Insufficient documentation

## 2016-11-03 DIAGNOSIS — I10 Essential (primary) hypertension: Secondary | ICD-10-CM | POA: Diagnosis not present

## 2016-11-03 DIAGNOSIS — R1010 Upper abdominal pain, unspecified: Secondary | ICD-10-CM | POA: Diagnosis not present

## 2016-11-03 DIAGNOSIS — J45909 Unspecified asthma, uncomplicated: Secondary | ICD-10-CM | POA: Diagnosis not present

## 2016-11-03 DIAGNOSIS — F17210 Nicotine dependence, cigarettes, uncomplicated: Secondary | ICD-10-CM | POA: Diagnosis not present

## 2016-11-03 LAB — CBC WITH DIFFERENTIAL/PLATELET
BASOS ABS: 0.1 10*3/uL (ref 0–0.1)
Basophils Relative: 1 %
EOS ABS: 0.4 10*3/uL (ref 0–0.7)
Eosinophils Relative: 3 %
HCT: 27.6 % — ABNORMAL LOW (ref 35.0–47.0)
Hemoglobin: 9.9 g/dL — ABNORMAL LOW (ref 12.0–16.0)
LYMPHS ABS: 3.1 10*3/uL (ref 1.0–3.6)
LYMPHS PCT: 23 %
MCH: 34 pg (ref 26.0–34.0)
MCHC: 36 g/dL (ref 32.0–36.0)
MCV: 94.5 fL (ref 80.0–100.0)
Monocytes Absolute: 1.4 10*3/uL — ABNORMAL HIGH (ref 0.2–0.9)
Monocytes Relative: 10 %
NEUTROS ABS: 8.6 10*3/uL — AB (ref 1.4–6.5)
Neutrophils Relative %: 63 %
PLATELETS: 287 10*3/uL (ref 150–440)
RBC: 2.92 MIL/uL — AB (ref 3.80–5.20)
RDW: 16.9 % — ABNORMAL HIGH (ref 11.5–14.5)
WBC: 13.6 10*3/uL — AB (ref 3.6–11.0)

## 2016-11-03 LAB — URINALYSIS COMPLETE WITH MICROSCOPIC (ARMC ONLY)
BILIRUBIN URINE: NEGATIVE
Bacteria, UA: NONE SEEN
GLUCOSE, UA: NEGATIVE mg/dL
Hgb urine dipstick: NEGATIVE
KETONES UR: NEGATIVE mg/dL
Leukocytes, UA: NEGATIVE
Nitrite: NEGATIVE
Protein, ur: NEGATIVE mg/dL
Specific Gravity, Urine: 1.01 (ref 1.005–1.030)
pH: 7 (ref 5.0–8.0)

## 2016-11-03 LAB — RESPIRATORY PANEL BY PCR
ADENOVIRUS-RVPPCR: NOT DETECTED
Bordetella pertussis: NOT DETECTED
CORONAVIRUS 229E-RVPPCR: NOT DETECTED
CORONAVIRUS NL63-RVPPCR: NOT DETECTED
CORONAVIRUS OC43-RVPPCR: NOT DETECTED
Chlamydophila pneumoniae: NOT DETECTED
Coronavirus HKU1: NOT DETECTED
INFLUENZA B-RVPPCR: NOT DETECTED
Influenza A: NOT DETECTED
MYCOPLASMA PNEUMONIAE-RVPPCR: NOT DETECTED
Metapneumovirus: NOT DETECTED
PARAINFLUENZA VIRUS 1-RVPPCR: NOT DETECTED
Parainfluenza Virus 2: NOT DETECTED
Parainfluenza Virus 3: NOT DETECTED
Parainfluenza Virus 4: NOT DETECTED
Respiratory Syncytial Virus: NOT DETECTED
Rhinovirus / Enterovirus: NOT DETECTED

## 2016-11-03 LAB — RETICULOCYTES
RBC.: 2.92 MIL/uL — ABNORMAL LOW (ref 3.80–5.20)
RETIC COUNT ABSOLUTE: 192.7 10*3/uL — AB (ref 19.0–183.0)
Retic Ct Pct: 6.6 % — ABNORMAL HIGH (ref 0.4–3.1)

## 2016-11-03 LAB — PREGNANCY, URINE: PREG TEST UR: NEGATIVE

## 2016-11-03 LAB — TROPONIN I

## 2016-11-03 LAB — POCT PREGNANCY, URINE: PREG TEST UR: NEGATIVE

## 2016-11-03 MED ORDER — SODIUM CHLORIDE 0.9 % IV SOLN
Freq: Once | INTRAVENOUS | Status: AC
  Administered 2016-11-03: 16:00:00 via INTRAVENOUS

## 2016-11-03 MED ORDER — HYDROMORPHONE HCL 1 MG/ML IJ SOLN
0.5000 mg | Freq: Once | INTRAMUSCULAR | Status: AC
  Administered 2016-11-03: 0.5 mg via INTRAVENOUS
  Filled 2016-11-03: qty 1

## 2016-11-03 MED ORDER — IOPAMIDOL (ISOVUE-300) INJECTION 61%
30.0000 mL | Freq: Once | INTRAVENOUS | Status: AC | PRN
  Administered 2016-11-03: 30 mL via ORAL

## 2016-11-03 MED ORDER — OXYCODONE-ACETAMINOPHEN 5-325 MG PO TABS
1.0000 | ORAL_TABLET | Freq: Four times a day (QID) | ORAL | 0 refills | Status: AC | PRN
Start: 2016-11-03 — End: 2017-11-03

## 2016-11-03 MED ORDER — MORPHINE SULFATE (PF) 4 MG/ML IV SOLN
4.0000 mg | Freq: Once | INTRAVENOUS | Status: AC
  Administered 2016-11-03: 4 mg via INTRAVENOUS
  Filled 2016-11-03: qty 1

## 2016-11-03 MED ORDER — IOPAMIDOL (ISOVUE-300) INJECTION 61%
100.0000 mL | Freq: Once | INTRAVENOUS | Status: AC | PRN
  Administered 2016-11-03: 100 mL via INTRAVENOUS

## 2016-11-03 MED ORDER — ONDANSETRON HCL 4 MG/2ML IJ SOLN
4.0000 mg | Freq: Once | INTRAMUSCULAR | Status: AC
  Administered 2016-11-03: 4 mg via INTRAVENOUS
  Filled 2016-11-03: qty 2

## 2016-11-03 NOTE — ED Triage Notes (Signed)
Pt reports hx of sickle cell, reports joint pain and chest cold. Pt reports decreased energy.

## 2016-11-03 NOTE — ED Provider Notes (Signed)
[facility] Emergency Department Provider Note   ____________________________________________   First MD Initiated Contact with Patient 11/03/16 1417     (approximate)  I have reviewed the triage vital signs and the nursing notes.   HISTORY  Chief Complaint Sickle Cell Pain Crisis    HPI Brittany Shannon is a 45 y.o. female who reports she's had sneezing and coughing for the last 2-3 days. Increased cough began to produce some white phlegm. She is now aching and some of her joints especially left elbow. She thinks her  disease might be acting up. She is not running a fever she is not short of breath has no chest pain.  Past Medical History:  Diagnosis Date  . Asthma   . 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 Homelessness   . Hypertension   . Sickle cell anemia [facility])     Patient Active Problem List   Diagnosis Date Noted  . 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 Chest pain 07/14/2016  . 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 Asthma 07/14/2016  . HTN (hypertension) 07/14/2016    Past Surgical History:  Procedure Laterality Date  . CHOLECYSTECTOMY    . EYE SURGERY    . SPLENECTOMY, TOTAL      Prior to Admission medications   Medication Sig Start Date End Date Taking? Authorizing Provider  lisinopril (PRINIVIL,ZESTRIL) 5 MG tablet Take 5 mg by mouth daily.    Historical Provider, MD    Allergies Patient has no known allergies.  No family history on file.  Social History Social History  Substance Use Topics  . Smoking status: Current Every Day Smoker    Types: Cigarettes  . Smokeless tobacco: Never Used  . Alcohol use No    Review of Systems Constitutional: No fever/chills Eyes: No visual changes. ENT: No sore throat. Cardiovascular: Denies chest pain. Respiratory: Denies shortness of breath. Gastrointestinal: No abdominal pain.  No nausea, no vomiting.  No diarrhea.  No constipation. Genitourinary: Negative for  dysuria. Musculoskeletal: Negative for back pain. Skin: Negative for rash. Neurological: Negative for headaches, focal weakness or numbness.  10-point ROS otherwise negative.  ____________________________________________   PHYSICAL EXAM:  VITAL SIGNS: ED Triage Vitals  Enc Vitals Group     BP 11/03/16 1332 (!) 149/95     Pulse Rate 11/03/16 1332 82     Resp 11/03/16 1332 16     Temp 11/03/16 1332 98.4 F (36.9 C)     Temp Source 11/03/16 1332 Oral     SpO2 11/03/16 1332 96 %     Weight 11/03/16 1331 130 lb (59 kg)     Height 11/03/16 1331 5\' 9"  (1.753 m)     Head Circumference --      Peak Flow --      Pain Score 11/03/16 1331 5     Pain Loc --      Pain Edu? --      Excl. in [place]? --    Constitutional: Alert and oriented. Well appearing and in no acute distress. Eyes: Conjunctivae are normal. PERRL. EOMI. Head: Atraumatic. Nose: No congestion/rhinnorhea. Mouth/Throat: Mucous membranes are moist.  Oropharynx non-erythematous. Neck: No stridor.   Cardiovascular: Normal rate, regular rhythm. Grossly normal heart sounds.  Good peripheral circulation. Respiratory: Normal respiratory effort.  No retractions. Lungs CTAB. Gastrointestinal: Soft and nontender. No distention. No abdominal bruits. No CVA tenderness. Musculoskeletal: No lower extremity tenderness nor edema.  No joint effusions.Patient reports her left elbow hurts the elbow  itself looks like it has full range of motion is no erythema swelling or warmth around it. Neurologic:  Normal speech and language. No gross focal neurologic deficits are appreciated. No gait instability. Skin:  Skin is warm, dry and intact. No rash noted.  ____________________________________________   LABS (all labs ordered are listed, but only abnormal results are displayed)  Labs Reviewed  CBC WITH DIFFERENTIAL/PLATELET - Abnormal; Notable for the following:       Result Value   WBC 13.6 (*)    RBC 2.92 (*)    Hemoglobin 9.9 (*)    HCT  27.6 (*)    RDW 16.9 (*)    Neutro Abs 8.6 (*)    Monocytes Absolute 1.4 (*)    All other components within normal limits  URINALYSIS COMPLETEWITH MICROSCOPIC ([facility] ONLY) - Abnormal; Notable for the following:    Color, Urine YELLOW (*)    APPearance CLEAR (*)    Squamous Epithelial / LPF 0-5 (*)    All other components within normal limits  RETICULOCYTES - Abnormal; Notable for the following:    Retic Ct Pct 6.6 (*)    RBC. 2.92 (*)    Retic Count, Manual 192.7 (*)    All other components within normal limits  RESPIRATORY PANEL BY PCR  RAPID INFLUENZA A&B ANTIGENS ([facility] ONLY)  COMPREHENSIVE METABOLIC PANEL  TROPONIN I  PREGNANCY, URINE  POCT PREGNANCY, URINE   ____________________________________________  EKG  EKG read and interpreted by me shows normal sinus rhythm at a rate of 83 normal axis the baseline is very irregular however I do not see any acute ST-T wave changes. ____________________________________________  RADIOLOGY  Study Result   CLINICAL DATA:  Sudden onset of severe pain in the upper abdomen with radiation to the back  EXAM: CT ABDOMEN AND PELVIS WITH CONTRAST  TECHNIQUE: Multidetector CT imaging of the abdomen and pelvis was performed using the standard protocol following bolus administration of intravenous contrast.  CONTRAST:  163mL ISOVUE-300 IOPAMIDOL (ISOVUE-300) INJECTION 61%  COMPARISON:  07/14/2016, 10/24/2016  FINDINGS: Lower chest: No acute consolidation or effusion. Linear atelectasis within the lung bases.  Hepatobiliary: No focal hepatic abnormalities. Gallbladder not well identified and is presumed absent. Mildly prominent extrahepatic common bile duct.  Pancreas: Unremarkable. No pancreatic ductal dilatation or surrounding inflammatory changes.  Spleen: Spleen is very small in appearance. No focal splenic abnormalities.  Adrenals/Urinary Tract: Adrenal glands are within normal limits. No hydronephrosis. No focal  calcifications. Sub cortical hypodense lesions in the left kidney, too small to further characterize. Bladder is normal.  Stomach/Bowel: Moderate enlargement of the stomach. No evidence for bowel obstruction. Large amount of stool in the colon. No colon wall thickening.  Vascular/Lymphatic: Atherosclerosis of the aorta. No significantly enlarged abdominal or pelvic lymph nodes.  Reproductive: Uterus and bilateral adnexa are unremarkable.  Other: No abdominal wall hernia or abnormality. No abdominopelvic ascites  Musculoskeletal: No acute or significant osseous findings.  IMPRESSION: 1. No CT evidence for acute intra-abdominal or pelvic pathology. 2. Gallbladder not well identified and is presumed surgically absent. Slightly enlarged extrahepatic common bile duct is likely related to post surgical change. 3. Sub cm hypodense lesions in the left kidney, too small to further characterize. 4. Small spleen.   Electronically Signed   By: Donavan Foil M.D.   On: 11/03/2016 18:37    Study Result   CLINICAL DATA:  Sickle cell.  EXAM: CHEST  2 VIEW  COMPARISON:  CT 07/14/2016.  Chest x-ray 07/14/2016 .  FINDINGS:  Hilar structures are normal. Thoracic aorta is tortuous. Lungs are clear. No pleural effusion or pneumothorax. Cardiomegaly with normal pulmonary vascularity. No acute bony abnormality.  IMPRESSION: Cardiomegaly.  No evidence of congestive heart failure.   Electronically Signed   By: Marcello Moores  Register   On: 11/03/2016 15:49     ____________________________________________   PROCEDURES  Procedure(s) performed:  Procedures  Critical Care performed:   ____________________________________________   INITIAL IMPRESSION / ASSESSMENT AND PLAN / ED COURSE  Pertinent labs & imaging results that were available during my care of the patient were reviewed by me and considered in my medical decision making (see chart for  details).    Clinical Course    At 6:30 patient is sleeping comfortably and has to be woken cannot by the nurse.  ____________________________________________   FINAL CLINICAL IMPRESSION(S) / ED DIAGNOSES  Final diagnoses:  Sickle cell pain crisis ([place])      NEW MEDICATIONS STARTED DURING THIS VISIT:  New Prescriptions   No medications on file     Note:  This document was prepared using Dragon voice recognition software and may include unintentional dictation errors.    Nena Polio, MD 11/03/16 [phone]

## 2016-11-03 NOTE — ED Notes (Signed)
clarified with Dr Cinda Quest CT order - he changed order to CT of abd and pelvis with contrast

## 2016-11-03 NOTE — Discharge Instructions (Signed)
Return as needed please be sure to drink plenty of fluids. You can use the Percocet if needed for pain. The pain gets bad again I would return immediately also return for fever shortness of breath that cough or any other complaints. Follow up with her doctor this coming week.

## 2016-11-03 NOTE — ED Notes (Signed)
Pt c/o severe sudden onset of upper abd pain radiating across entire abd with back spasms - Dr Cinda Quest notified and VO for CT of abd with contrast and Dilaudid

## 2016-11-05 LAB — COMPREHENSIVE METABOLIC PANEL
ALT: 15 U/L (ref 14–54)
AST: 32 U/L (ref 15–41)
Albumin: 4.3 g/dL (ref 3.5–5.0)
Alkaline Phosphatase: 56 U/L (ref 38–126)
Anion gap: 5 (ref 5–15)
BILIRUBIN TOTAL: 2.7 mg/dL — AB (ref 0.3–1.2)
BUN: 11 mg/dL (ref 6–20)
CO2: 30 mmol/L (ref 22–32)
Calcium: 9.8 mg/dL (ref 8.9–10.3)
Chloride: 105 mmol/L (ref 101–111)
Creatinine, Ser: 0.79 mg/dL (ref 0.44–1.00)
Glucose, Bld: 90 mg/dL (ref 65–99)
POTASSIUM: 4 mmol/L (ref 3.5–5.1)
Sodium: 140 mmol/L (ref 135–145)
TOTAL PROTEIN: 8.1 g/dL (ref 6.5–8.1)

## 2017-02-20 DIAGNOSIS — H36 Retinal disorders in diseases classified elsewhere: Secondary | ICD-10-CM | POA: Insufficient documentation

## 2017-02-20 DIAGNOSIS — Z9889 Other specified postprocedural states: Secondary | ICD-10-CM | POA: Insufficient documentation

## 2017-02-20 DIAGNOSIS — H26221 Cataract secondary to ocular disorders (degenerative) (inflammatory), right eye: Secondary | ICD-10-CM | POA: Insufficient documentation

## 2019-04-28 ENCOUNTER — Inpatient Hospital Stay (HOSPITAL_COMMUNITY): Payer: Medicare Other

## 2019-04-28 ENCOUNTER — Other Ambulatory Visit: Payer: Self-pay

## 2019-04-28 ENCOUNTER — Encounter (HOSPITAL_COMMUNITY): Payer: Self-pay | Admitting: Internal Medicine

## 2019-04-28 ENCOUNTER — Inpatient Hospital Stay (HOSPITAL_COMMUNITY)
Admission: EM | Admit: 2019-04-28 | Discharge: 2019-04-29 | DRG: 812 | Disposition: A | Discharge status: Home / Self Care | Payer: Medicare Other | Attending: Internal Medicine | Admitting: Internal Medicine

## 2019-04-28 DIAGNOSIS — Z1159 Encounter for screening for other viral diseases: Secondary | ICD-10-CM

## 2019-04-28 DIAGNOSIS — D5720 Sickle-cell/Hb-C disease without crisis: Secondary | ICD-10-CM | POA: Diagnosis present

## 2019-04-28 DIAGNOSIS — Z9081 Acquired absence of spleen: Secondary | ICD-10-CM | POA: Diagnosis not present

## 2019-04-28 DIAGNOSIS — Z832 Family history of diseases of the blood and blood-forming organs and certain disorders involving the immune mechanism: Secondary | ICD-10-CM | POA: Diagnosis not present

## 2019-04-28 DIAGNOSIS — R112 Nausea with vomiting, unspecified: Secondary | ICD-10-CM | POA: Diagnosis present

## 2019-04-28 DIAGNOSIS — J45909 Unspecified asthma, uncomplicated: Secondary | ICD-10-CM | POA: Diagnosis present

## 2019-04-28 DIAGNOSIS — Z590 Homelessness: Secondary | ICD-10-CM | POA: Diagnosis not present

## 2019-04-28 DIAGNOSIS — D57219 Sickle-cell/Hb-C disease with crisis, unspecified: Secondary | ICD-10-CM

## 2019-04-28 DIAGNOSIS — D5700 Hb-SS disease with crisis, unspecified: Secondary | ICD-10-CM | POA: Diagnosis present

## 2019-04-28 DIAGNOSIS — N390 Urinary tract infection, site not specified: Secondary | ICD-10-CM | POA: Diagnosis present

## 2019-04-28 DIAGNOSIS — E876 Hypokalemia: Secondary | ICD-10-CM | POA: Diagnosis present

## 2019-04-28 DIAGNOSIS — R9431 Abnormal electrocardiogram [ECG] [EKG]: Secondary | ICD-10-CM

## 2019-04-28 DIAGNOSIS — J4520 Mild intermittent asthma, uncomplicated: Secondary | ICD-10-CM | POA: Diagnosis not present

## 2019-04-28 DIAGNOSIS — F17210 Nicotine dependence, cigarettes, uncomplicated: Secondary | ICD-10-CM | POA: Diagnosis present

## 2019-04-28 DIAGNOSIS — I10 Essential (primary) hypertension: Secondary | ICD-10-CM | POA: Diagnosis present

## 2019-04-28 LAB — COMPREHENSIVE METABOLIC PANEL
ALT: 13 U/L (ref 0–44)
AST: 23 U/L (ref 15–41)
Albumin: 4.8 g/dL (ref 3.5–5.0)
Alkaline Phosphatase: 56 U/L (ref 38–126)
Anion gap: 10 (ref 5–15)
BUN: 8 mg/dL (ref 6–20)
CO2: 25 mmol/L (ref 22–32)
Calcium: 9.7 mg/dL (ref 8.9–10.3)
Chloride: 104 mmol/L (ref 98–111)
Creatinine, Ser: 0.72 mg/dL (ref 0.44–1.00)
GFR calc Af Amer: >60 mL/min (ref >60)
GFR calc non Af Amer: >60 mL/min (ref >60)
Glucose, Bld: 122 mg/dL — ABNORMAL HIGH (ref 70–99)
Potassium: 2.7 mmol/L — CL (ref 3.5–5.1)
Sodium: 139 mmol/L (ref 135–145)
Total Bilirubin: 3.8 mg/dL — ABNORMAL HIGH (ref 0.3–1.2)
Total Protein: 8.4 g/dL — ABNORMAL HIGH (ref 6.5–8.1)

## 2019-04-28 LAB — CBC WITH DIFFERENTIAL/PLATELET
Abs Immature Granulocytes: 0.04 10*3/uL (ref 0.00–0.07)
Basophils Absolute: 0.1 10*3/uL (ref 0.0–0.1)
Basophils Relative: 1 %
Eosinophils Absolute: 0 10*3/uL (ref 0.0–0.5)
Eosinophils Relative: 0 %
HCT: 28.2 % — ABNORMAL LOW (ref 36.0–46.0)
Hemoglobin: 10.9 g/dL — ABNORMAL LOW (ref 12.0–15.0)
Immature Granulocytes: 0 %
Lymphocytes Relative: 9 %
Lymphs Abs: 1.1 10*3/uL (ref 0.7–4.0)
MCH: 32.6 pg (ref 26.0–34.0)
MCHC: 38.7 g/dL — ABNORMAL HIGH (ref 30.0–36.0)
MCV: 84.4 fL (ref 80.0–100.0)
Monocytes Absolute: 0.4 10*3/uL (ref 0.1–1.0)
Monocytes Relative: 3 %
Neutro Abs: 11.1 10*3/uL — ABNORMAL HIGH (ref 1.7–7.7)
Neutrophils Relative %: 87 %
Platelets: 265 10*3/uL (ref 150–400)
RBC: 3.34 MIL/uL — ABNORMAL LOW (ref 3.87–5.11)
RDW: 15.3 % (ref 11.5–15.5)
WBC: 12.7 10*3/uL — ABNORMAL HIGH (ref 4.0–10.5)
nRBC: 0.4 % — ABNORMAL HIGH (ref 0.0–0.2)

## 2019-04-28 LAB — RETICULOCYTES
Immature Retic Fract: 24.8 % — ABNORMAL HIGH (ref 2.3–15.9)
RBC.: 3.36 MIL/uL — ABNORMAL LOW (ref 3.87–5.11)
Retic Count, Absolute: 126.3 10*3/uL (ref 19.0–186.0)
Retic Ct Pct: 3.8 % — ABNORMAL HIGH (ref 0.4–3.1)

## 2019-04-28 LAB — URINALYSIS, ROUTINE W REFLEX MICROSCOPIC
Bilirubin Urine: NEGATIVE
Glucose, UA: NEGATIVE mg/dL
Ketones, ur: NEGATIVE mg/dL
Nitrite: NEGATIVE
Protein, ur: 30 mg/dL — AB
Specific Gravity, Urine: 1.011 (ref 1.005–1.030)
pH: 7 (ref 5.0–8.0)

## 2019-04-28 LAB — SARS CORONAVIRUS 2 BY RT PCR (HOSPITAL ORDER, PERFORMED IN ~~LOC~~ HOSPITAL LAB): SARS Coronavirus 2: NEGATIVE

## 2019-04-28 LAB — TROPONIN I: Troponin I: <0.03 ng/mL (ref <0.03)

## 2019-04-28 LAB — MAGNESIUM
Magnesium: 1.9 mg/dL (ref 1.7–2.4)
Magnesium: 1.6 mg/dL — ABNORMAL LOW (ref 1.7–2.4)

## 2019-04-28 LAB — LIPASE, BLOOD: Lipase: 33 U/L (ref 11–51)

## 2019-04-28 LAB — PHOSPHORUS: Phosphorus: 3.5 mg/dL (ref 2.5–4.6)

## 2019-04-28 MED ORDER — ONDANSETRON HCL 4 MG/2ML IJ SOLN
4.0000 mg | Freq: Once | INTRAMUSCULAR | Status: AC
  Administered 2019-04-28: 4 mg via INTRAVENOUS
  Filled 2019-04-28: qty 2

## 2019-04-28 MED ORDER — KETOROLAC TROMETHAMINE 30 MG/ML IJ SOLN
30.0000 mg | Freq: Four times a day (QID) | INTRAMUSCULAR | Status: DC
  Administered 2019-04-28 – 2019-04-29 (×3): 30 mg via INTRAVENOUS
  Filled 2019-04-28 (×4): qty 1

## 2019-04-28 MED ORDER — SENNOSIDES-DOCUSATE SODIUM 8.6-50 MG PO TABS
1.0000 | ORAL_TABLET | Freq: Two times a day (BID) | ORAL | Status: DC
  Administered 2019-04-28 – 2019-04-29 (×2): 1 via ORAL
  Filled 2019-04-28 (×2): qty 1

## 2019-04-28 MED ORDER — CEFTRIAXONE SODIUM 1 G IJ SOLR
1.0000 g | Freq: Once | INTRAMUSCULAR | Status: AC
  Administered 2019-04-28: 1 g via INTRAVENOUS
  Filled 2019-04-28: qty 10

## 2019-04-28 MED ORDER — ENOXAPARIN SODIUM 40 MG/0.4ML ~~LOC~~ SOLN
40.0000 mg | Freq: Every day | SUBCUTANEOUS | Status: DC
  Administered 2019-04-28: 40 mg via SUBCUTANEOUS
  Filled 2019-04-28 (×2): qty 0.4

## 2019-04-28 MED ORDER — NALOXONE HCL 0.4 MG/ML IJ SOLN: 0.4000 mg | INTRAMUSCULAR | Status: DC | PRN

## 2019-04-28 MED ORDER — SODIUM CHLORIDE 0.9 % IV SOLN
INTRAVENOUS | Status: DC | PRN
  Administered 2019-04-28: 500 mL via INTRAVENOUS

## 2019-04-28 MED ORDER — NICOTINE 21 MG/24HR TD PT24
21.0000 mg | MEDICATED_PATCH | Freq: Every day | TRANSDERMAL | Status: DC
  Filled 2019-04-28: qty 1

## 2019-04-28 MED ORDER — POTASSIUM CHLORIDE 10 MEQ/100ML IV SOLN
10.0000 meq | Freq: Once | INTRAVENOUS | Status: AC
  Administered 2019-04-28: 21:00:00 10 meq via INTRAVENOUS
  Filled 2019-04-28: qty 100

## 2019-04-28 MED ORDER — DEXTROSE-NACL 5-0.45 % IV SOLN
INTRAVENOUS | Status: DC
  Administered 2019-04-28 – 2019-04-29 (×2): via INTRAVENOUS

## 2019-04-28 MED ORDER — LISINOPRIL 10 MG PO TABS
10.0000 mg | ORAL_TABLET | Freq: Every day | ORAL | Status: DC
  Administered 2019-04-29: 10 mg via ORAL
  Filled 2019-04-28: qty 1

## 2019-04-28 MED ORDER — MORPHINE SULFATE (PF) 4 MG/ML IV SOLN
4.0000 mg | Freq: Once | INTRAVENOUS | Status: AC
  Administered 2019-04-28: 16:00:00 4 mg via INTRAVENOUS
  Filled 2019-04-28: qty 1

## 2019-04-28 MED ORDER — HYDROMORPHONE 1 MG/ML IV SOLN
INTRAVENOUS | Status: DC
  Administered 2019-04-28: 30 mg via INTRAVENOUS
  Administered 2019-04-29: 0.3 mg via INTRAVENOUS
  Administered 2019-04-29: 1.1 mg via INTRAVENOUS
  Filled 2019-04-28: qty 30

## 2019-04-28 MED ORDER — FOLIC ACID 1 MG PO TABS
1.0000 mg | ORAL_TABLET | Freq: Every day | ORAL | Status: DC
  Administered 2019-04-29: 1 mg via ORAL
  Filled 2019-04-28: qty 1

## 2019-04-28 MED ORDER — SODIUM CHLORIDE 0.9 % IV SOLN
1.0000 g | INTRAVENOUS | Status: DC
  Filled 2019-04-28: qty 10

## 2019-04-28 MED ORDER — MORPHINE SULFATE (PF) 4 MG/ML IV SOLN
4.0000 mg | Freq: Once | INTRAVENOUS | Status: AC
  Administered 2019-04-28: 17:00:00 4 mg via INTRAVENOUS
  Filled 2019-04-28: qty 1

## 2019-04-28 MED ORDER — SODIUM CHLORIDE 0.9 % IV BOLUS (SEPSIS)
1000.0000 mL | Freq: Once | INTRAVENOUS | Status: AC
  Administered 2019-04-28: 1000 mL via INTRAVENOUS

## 2019-04-28 MED ORDER — POTASSIUM CHLORIDE CRYS ER 20 MEQ PO TBCR
40.0000 meq | EXTENDED_RELEASE_TABLET | Freq: Once | ORAL | Status: AC
  Administered 2019-04-28: 17:00:00 40 meq via ORAL
  Filled 2019-04-28: qty 2

## 2019-04-28 MED ORDER — POLYETHYLENE GLYCOL 3350 17 G PO PACK: 17.0000 g | PACK | Freq: Every day | ORAL | Status: DC | PRN

## 2019-04-28 MED ORDER — METOCLOPRAMIDE HCL 5 MG/ML IJ SOLN
5.0000 mg | Freq: Four times a day (QID) | INTRAMUSCULAR | Status: DC | PRN
  Administered 2019-04-28 – 2019-04-29 (×2): 5 mg via INTRAVENOUS
  Filled 2019-04-28 (×2): qty 2

## 2019-04-28 MED ORDER — BISACODYL 5 MG PO TBEC: 5.0000 mg | DELAYED_RELEASE_TABLET | Freq: Every day | ORAL | Status: DC | PRN

## 2019-04-28 MED ORDER — SODIUM CHLORIDE 0.9% FLUSH: 9.0000 mL | INTRAVENOUS | Status: DC | PRN

## 2019-04-28 MED ORDER — HYDROMORPHONE HCL 1 MG/ML IJ SOLN
1.0000 mg | INTRAMUSCULAR | Status: DC | PRN
  Administered 2019-04-28: 1 mg via INTRAVENOUS
  Filled 2019-04-28: qty 1

## 2019-04-28 NOTE — ED Triage Notes (Signed)
Transported by [facility] from home--sickle cell crisis, pain to lower back and patient reports n/v since 900 am. VSS.

## 2019-04-28 NOTE — ED Notes (Signed)
RN Vikki Ports reports x2 attempts at blood work/ IV insertion without success. Ultrasound IV needed for IV and blood draw

## 2019-04-28 NOTE — ED Notes (Signed)
Patient drank water without difficulty. No nausea or emesis reported by patient

## 2019-04-28 NOTE — ED Notes (Signed)
RN attempted x2 to start a new IV. RN was unaware until now that patient had pulled out ultrasound IV.

## 2019-04-28 NOTE — H&P (Addendum)
Brittany Shannon FBP:[id number] DOB: 03-13-1971 DOA: 04/28/2019     PCP: System, Provider Not In   Outpatient Specialists:     Hematology Deberah Pelton [facility] Patient arrived to ER on 04/28/19 at 1404  Patient coming from: home   Chief Complaint:  Chief Complaint  Patient presents with  . Sickle Cell Pain Crisis    HPI: Brittany Shannon is a 48 y.o. female with medical history significant of the sickle cell disease, asthma, hypertension delusional disorder, homelessness, sp splenectomy ectomy  heard of hearing  Presented with pain nausea vomiting abdominal pain and back pain began 8 AM this morning she is typical of her pain crisis but in the past she does not usually come to the emergency department for this and is only on ibuprofen to control her flareup ups.Reports  a little bit of abdominal tenderness For subjective  fever no coughing [place] suprapubic abdominal tenderness and pain Nausea and vomiting  She has cochlear implant that has not been working well and she has hearing aids but  battery dead so she is unable to hear much difficult to obtain history due to that   Infectious risk factors:  Reports   Fever, abdominal pain In RAPID COVID TEST NEGATIVE     Regarding pertinent Chronic problems:     sickle cell  diesease with rare admissions Not on chronic narcotics  HTN on suposed to be on lisinopril   Asthma -well   Controlled     While in ER:  12.7 white count her hemoglobin 10.9 which is pretty much her normal and her reticulocyte were not that elevated 3.8 But  her potassium was 2.7 ECG showed  prolonged QTC of 521 which is new for her  She recieved some morphine and some Zofran and her pain is controlled but she still pretty nauseous with vomiting Started to treat UTI because of her urineapeared dirty and she is having the vomiting and suprapubic pain Urine culture obtained  fluids pain meds Zofran potassium and Rocephin  The following Work up has been  ordered so far:  Orders Placed This Encounter  Procedures  . Urine culture  . SARS Coronavirus 2 (CEPHEID - Performed in [place] hospital lab), [facility]  . DG Abd 1 View  . CBC with Differential  . Comprehensive metabolic panel  . Lipase, blood  . Urinalysis, Routine w reflex microscopic  . Magnesium  . Reticulocytes  . Magnesium  . Phosphorus  . Troponin I - Add-On to previous collection  . Lipid panel  . Urine rapid drug screen (hosp performed)  . Fluid/PO Challenge  . Cardiac monitoring  . Consult to hospitalist  [phone]  . ED EKG  . EKG 12-Lead  . Insert peripheral IV  . Admit to Inpatient (patient's expected length of stay will be greater than 2 midnights or inpatient only procedure)    Following Medications were ordered in ER: Medications  0.9 %  sodium chloride infusion (500 mLs Intravenous New Bag/Given 04/28/19 1650)  potassium chloride 10 mEq in 100 mL IVPB (10 mEq Intravenous New Bag/Given 04/28/19 2055)  HYDROmorphone (DILAUDID) injection 1 mg (1 mg Intravenous Given 04/28/19 2051)  metoCLOPramide (REGLAN) injection 5 mg (5 mg Intravenous Given 04/28/19 2049)  lisinopril (ZESTRIL) tablet 10 mg (has no administration in time range)  sodium chloride 0.9 % bolus 1,000 mL (0 mLs Intravenous Stopped 04/28/19 1648)  morphine 4 MG/ML injection 4 mg (4 mg Intravenous Given 04/28/19 1534)  ondansetron (ZOFRAN) injection 4  mg (4 mg Intravenous Given 04/28/19 1532)  potassium chloride SA (K-DUR) CR tablet 40 mEq (40 mEq Oral Given 04/28/19 1646)  ondansetron (ZOFRAN) injection 4 mg (4 mg Intravenous Given 04/28/19 1649)  morphine 4 MG/ML injection 4 mg (4 mg Intravenous Given 04/28/19 1652)  cefTRIAXone (ROCEPHIN) 1 g in sodium chloride 0.9 % 100 mL IVPB (0 g Intravenous Stopped 04/28/19 1721)        Consult Orders  (From admission, onward)         Start     Ordered   04/28/19 1800  Consult to hospitalist  [place]  Once    Comments:  1853  Provider:  Toy Baker, MD   Question Answer Comment  Place call to: [facility]   Reason for Consult Admit      04/28/19 1800           Significant initial  Findings: Abnormal Labs Reviewed  CBC WITH DIFFERENTIAL/PLATELET - Abnormal; Notable for the following components:      Result Value   WBC 12.7 (*)    RBC 3.34 (*)    Hemoglobin 10.9 (*)    HCT 28.2 (*)    MCHC 38.7 (*)    nRBC 0.4 (*)    Neutro Abs 11.1 (*)    All other components within normal limits  COMPREHENSIVE METABOLIC PANEL - Abnormal; Notable for the following components:   Potassium 2.7 (*)    Glucose, Bld 122 (*)    Total Protein 8.4 (*)    Total Bilirubin 3.8 (*)    All other components within normal limits  URINALYSIS, ROUTINE W REFLEX MICROSCOPIC - Abnormal; Notable for the following components:   APPearance HAZY (*)    Hgb urine dipstick SMALL (*)    Protein, ur 30 (*)    Leukocytes,Ua LARGE (*)    Bacteria, UA RARE (*)    All other components within normal limits  RETICULOCYTES - Abnormal; Notable for the following components:   Retic Ct Pct 3.8 (*)    RBC. 3.36 (*)    Immature Retic Fract 24.8 (*)    All other components within normal limits     Otherwise labs showing:    Recent Labs  Lab 04/28/19 1523  NA 139  K 2.7*  CO2 25  GLUCOSE 122*  BUN 8  CREATININE 0.72  CALCIUM 9.7  MG 1.9    Cr    stable,    Lab Results  Component Value Date   CREATININE 0.72 04/28/2019   CREATININE 0.79 11/03/2016   CREATININE 0.71 10/24/2016    Recent Labs  Lab 04/28/19 1523  AST 23  ALT 13  ALKPHOS 56  BILITOT 3.8*  PROT 8.4*  ALBUMIN 4.8   Lab Results  Component Value Date   CALCIUM 9.7 04/28/2019     WBC      Component Value Date/Time   WBC 12.7 (H) 04/28/2019 1523   ANC    Component Value Date/Time   NEUTROABS 11.1 (H) 04/28/2019 1523    Plt: Lab Results  Component Value Date   PLT 265 04/28/2019     HG/HCT   stable,      Component Value Date/Time   HGB 10.9 (L) 04/28/2019 1523    HGB 10.9 (L) 02/13/2012 2057   HCT 28.2 (L) 04/28/2019 1523   HCT 32.5 (L) 02/13/2012 2057    Recent Labs  Lab 04/28/19 1523  LIPASE 33          UA possible evidence of  UTI       Urine analysis:    Component Value Date/Time   COLORURINE YELLOW 04/28/2019 1452   APPEARANCEUR HAZY (A) 04/28/2019 1452   APPEARANCEUR Clear 02/13/2012 2118   LABSPEC 1.011 04/28/2019 1452   LABSPEC 1.012 02/13/2012 2118   PHURINE 7.0 04/28/2019 1452   GLUCOSEU NEGATIVE 04/28/2019 1452   GLUCOSEU 50 mg/dL 02/13/2012 2118   HGBUR SMALL (A) 04/28/2019 1452   BILIRUBINUR NEGATIVE 04/28/2019 1452   BILIRUBINUR Negative 02/13/2012 2118   [place] 04/28/2019 1452   PROTEINUR 30 (A) 04/28/2019 1452   UROBILINOGEN 0.2 07/20/2013 1048   NITRITE NEGATIVE 04/28/2019 1452   LEUKOCYTESUR LARGE (A) 04/28/2019 1452   LEUKOCYTESUR Negative 02/13/2012 2118      ECG:  Personally reviewed by me showing: HR : 67 Rhythm: NSR      no evidence of ischemic changes QTC 517      ED Triage Vitals  Enc Vitals Group     BP 04/28/19 1421 (!) 160/97     Pulse Rate 04/28/19 1421 83     Resp 04/28/19 1421 19     Temp 04/28/19 1421 97.7 F (36.5 C)     Temp Source 04/28/19 1421 Oral     SpO2 04/28/19 1421 98 %     Weight --      Height --      Head Circumference --      Peak Flow --      Pain Score 04/28/19 1533 7     Pain Loc --      Pain Edu? --      Excl. in [place]? --   TMAX(24)@       Latest  Blood pressure (!) 168/93, pulse 83, temperature 97.7 F (36.5 C), temperature source Oral, resp. rate 17, last menstrual period 04/07/2013, SpO2 92 %.     Hospitalist was called for admission for hypokalemia possible UTI, sickle cell pain crisis   Review of Systems:    Pertinent positives include: Fevers, chills, fatigue,abdominal pain, nausea, vomiting,  Constitutional:  No weight loss, night sweats,  weight loss  HEENT:  No headaches, Difficulty swallowing,Tooth/dental problems,Sore throat,   No sneezing, itching, ear ache, nasal congestion, post nasal drip,  Cardio-vascular:  No chest pain, Orthopnea, PND, anasarca, dizziness, palpitations.no Bilateral lower extremity swelling  GI:  No heartburn, indigestion,  diarrhea, change in bowel habits, loss of appetite, melena, blood in stool, hematemesis Resp:  no shortness of breath at rest. No dyspnea on exertion, No excess mucus, no productive cough, No non-productive cough, No coughing up of blood.No change in color of mucus.No wheezing. Skin:  no rash or lesions. No jaundice GU:  no dysuria, change in color of urine, no urgency or frequency. No straining to urinate.  No flank pain.  Musculoskeletal:  No joint pain or no joint swelling. No decreased range of motion. No back pain.  Psych:  No change in mood or affect. No depression or anxiety. No memory loss.  Neuro: no localizing neurological complaints, no tingling, no weakness, no double vision, no gait abnormality, no slurred speech, no confusion  All systems reviewed and apart from [place] all are negative  Past Medical History:   Past Medical History:  Diagnosis Date  . Asthma   . 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 Homelessness   . Hypertension   . Sickle cell anemia (HCC)       Past Surgical History:  Procedure Laterality Date  . CHOLECYSTECTOMY    .  EYE SURGERY    . SPLENECTOMY, TOTAL      Social History:  Ambulatory  independently      reports that she has been smoking cigarettes. She has never used smokeless tobacco. She reports that she does not drink alcohol or use drugs.     Family History:   Family History  Problem Relation Age of Onset  . Sickle cell trait Other     Allergies: No Known Allergies   Prior to Admission medications   Medication Sig Start Date End Date Taking? Authorizing Provider  lisinopril (PRINIVIL,ZESTRIL) 5 MG tablet Take 5 mg by mouth daily.    [provider]   Physical Exam: Blood pressure (!) 168/93, pulse 83,  temperature 97.7 F (36.5 C), temperature source Oral, resp. rate 17, last menstrual period 04/07/2013, SpO2 92 %. 1. General:  in No  Acute distress    Chronically ill  -appearing 2. Psychological: Alert and  Oriented 3. Head/ENT:    Dry Mucous Membranes                          Head Non traumatic, neck supple                          Poor Dentition 4. SKIN:   decreased Skin turgor,  Skin clean Dry and intact no rash 5. Heart: Regular rate and rhythm no  Murmur, no Rub or gallop 6. Lungs:   no wheezes or crackles   7. Abdomen: Soft,  Suprapubic tenderness, Non distended  obese  bowel sounds present 8. Lower extremities: no clubbing, cyanosis, no edema 9. Neurologically Grossly intact, moving all 4 extremities equally   10. MSK: Normal range of motion   All other LABS:     Recent Labs  Lab 04/28/19 1523  WBC 12.7*  NEUTROABS 11.1*  HGB 10.9*  HCT 28.2*  MCV 84.4  PLT 265     Recent Labs  Lab 04/28/19 1523  NA 139  K 2.7*  CL 104  CO2 25  GLUCOSE 122*  BUN 8  CREATININE 0.72  CALCIUM 9.7  MG 1.9     Recent Labs  Lab 04/28/19 1523  AST 23  ALT 13  ALKPHOS 56  BILITOT 3.8*  PROT 8.4*  ALBUMIN 4.8       Cultures:    Component Value Date/Time   SDES URINE, RANDOM 07/15/2016 1045   SPECREQUEST NONE 07/15/2016 1045   CULT MULTIPLE SPECIES PRESENT, SUGGEST RECOLLECTION (A) 07/15/2016 1045   REPTSTATUS 07/16/2016 FINAL 07/15/2016 1045     Radiological Exams on Admission: No results found.  Chart has been reviewed   Assessment/Plan   48 y.o. female with medical history significant of the sickle cell disease, asthma, hypertension delusional disorder, homelessness, sp splenectomy ectomy  heard of hearing Admitted for sickle cell pain crisis and UTI  Present on Admission:  . Sickle cell crisis ([place])- - will admit per sickle cell protocol,    control pain,    hydrate with IVF D5 .45% Saline @ 100 mls/hour,    Weight based Dilaudid PCA low dose. Pt  with minimal exposure to narcotics   Not on hydroxyurea and folic acid   Transfuse as needed if Hg drops significantly below baseline.    No evidence of acute chest at this time   Sickle cell team to take over management in AM  . Asthma -currently stable . HTN (hypertension) currently  stable restart lisinopril . Hypokalemia - - will replace and repeat in AM,  check magnesium level and replace as needed  . Nausea and vomiting -no UTI but will obtain KUB to evaluate for any evidence of obstipation   . QT prolongation - - will monitor on tele avoid QT prolonging medications, rehydrate correct electrolytes    Other plan as per orders.  DVT prophylaxis:  Lovenox     Code Status:  FULL CODE   Family Communication:   Family not at  Bedside    Disposition Plan:      To home once workup is complete and patient is stable                                      Consults called: none  Admission status:  ED Disposition    ED Disposition Condition [place]: [place] [[id number]]  Level of Care: Telemetry [5]  Admit to tele based on following criteria: Monitor QTC interval  Covid Evaluation: N/A  Diagnosis: Sickle cell crisis [facility]) [[id number]]  Admitting Physician: Toy Baker [[id number]]  Attending Physician: Toy Baker [[id number]]  Estimated length of stay: 3 - 4 days  Certification:: I certify this patient will need inpatient services for at least 2 midnights  PT Class (Do Not Modify): Inpatient [101]  PT Acc Code (Do Not Modify): Private [1]          inpatient     Expect 2 midnight stay secondary to severity of patient's current illness including     Severe lab/radiological/exam abnormalities including:  hypokalemia   and extensive comorbidities including:  sickle cell disease  .    That are currently affecting medical management.   I expect  patient to be hospitalized for 2 midnights requiring inpatient medical care.  Patient  is at high risk for adverse outcome (such as loss of life or disability) if not treated.  Indication for inpatient stay as follows:   severe pain requiring acute inpatient management,  inability to maintain oral hydration    Need for IV antibiotics, IV fluids,   IV pain medications      Level of care    tele  For  24H       Precautions:  NONE   No active isolations  PPE: Used by the provider:   P100  eye Goggles,  Gloves       Brittany Shannon 04/28/2019, 9:37 PM    [facility]     after 2 AM please page floor coverage PA If 7AM-7PM, please contact the day team taking care of the patient using [email or web address]

## 2019-04-28 NOTE — ED Notes (Signed)
ED Provider at bedside. 

## 2019-04-28 NOTE — ED Notes (Signed)
Pt reports urinary frequency and has urinated 2 times in room without calling out for assistance. Pt urinated in bedpan and in an emesis bag.

## 2019-04-28 NOTE — ED Notes (Signed)
ED TO INPATIENT HANDOFF REPORT  ED Nurse Name and Phone #: [phone]  S Name/Age/Gender Brittany Shannon 48 y.o. female Room/Bed: WA25/WA25  Code Status   Code Status: Prior  Home/SNF/Other Home Patient oriented to: self, place, time and situation Is this baseline? Yes   Triage Complete: Triage complete  Chief Complaint Sickle Cell Crisis  Triage Note Transported by [facility] from home--sickle cell crisis, pain to lower back and patient reports n/v since 900 am. VSS.    Allergies No Known Allergies  Level of Care/Admitting Diagnosis ED Disposition    ED Disposition Condition [facility] Area: [place] [[id number]]  Level of Care: Telemetry [5]  Admit to tele based on following criteria: Monitor QTC interval  Covid Evaluation: N/A  Diagnosis: Sickle cell crisis [facility]) [[id number]]  Admitting Physician: Toy Baker [[id number]]  Attending Physician: Toy Baker [[id number]]  Estimated length of stay: 3 - 4 days  Certification:: I certify this patient will need inpatient services for at least 2 midnights  PT Class (Do Not Modify): Inpatient [101]  PT Acc Code (Do Not Modify): Private [1]       B Medical/Surgery History Past Medical History:  Diagnosis Date  . Asthma   . 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 Homelessness   . Hypertension   . Sickle cell anemia (HCC)    Past Surgical History:  Procedure Laterality Date  . CHOLECYSTECTOMY    . EYE SURGERY    . SPLENECTOMY, TOTAL       A IV Location/Drains/Wounds Patient Lines/Drains/Airways Status   Active Line/Drains/Airways    Name:   Placement date:   Placement time:   Site:   Days:   Peripheral IV 04/28/19 Right Hand   04/28/19    2035    (S) Hand   less than 1   Peripheral IV 04/28/19 Right;Upper Arm   04/28/19    2118    Arm   less than 1   Peripheral IV 04/28/19 Right Hand   04/28/19    2118    Hand   less than 1          Intake/Output Last 24 hours  Intake/Output Summary  (Last 24 hours) at 04/28/2019 2123 Last data filed at 04/28/2019 1721 Gross per 24 hour  Intake 1050 ml  Output --  Net 1050 ml    Labs/Imaging Results for orders placed or performed during the hospital encounter of 04/28/19 (from the past 48 hour(s))  Urinalysis, Routine w reflex microscopic     Status: Abnormal   Collection Time: 04/28/19  2:52 PM  Result Value Ref Range   Color, Urine YELLOW YELLOW   APPearance HAZY (A) CLEAR   Specific Gravity, Urine 1.011 1.005 - 1.030   pH 7.0 5.0 - 8.0   Glucose, UA NEGATIVE NEGATIVE mg/dL   Hgb urine dipstick SMALL (A) NEGATIVE   Bilirubin Urine NEGATIVE NEGATIVE   Ketones, ur NEGATIVE NEGATIVE mg/dL   Protein, ur 30 (A) NEGATIVE mg/dL   Nitrite NEGATIVE NEGATIVE   Leukocytes,Ua LARGE (A) NEGATIVE   RBC / HPF 6-10 0 - 5 RBC/hpf   WBC, UA 11-20 0 - 5 WBC/hpf   Bacteria, UA RARE (A) NONE SEEN   Squamous Epithelial / LPF 6-10 0 - 5   Mucus PRESENT    Hyaline Casts, UA PRESENT     Comment: Performed at [facility], [address] [place], [place] [address]  CBC with Differential  Status: Abnormal   Collection Time: 04/28/19  3:23 PM  Result Value Ref Range   WBC 12.7 (H) 4.0 - 10.5 K/uL    Comment: WHITE COUNT CONFIRMED ON SMEAR   RBC 3.34 (L) 3.87 - 5.11 MIL/uL   Hemoglobin 10.9 (L) 12.0 - 15.0 g/dL   HCT 28.2 (L) 36.0 - 46.0 %   MCV 84.4 80.0 - 100.0 fL   MCH 32.6 26.0 - 34.0 pg   MCHC 38.7 (H) 30.0 - 36.0 g/dL    Comment: Sickle cells present   RDW 15.3 11.5 - 15.5 %   Platelets 265 150 - 400 K/uL   nRBC 0.4 (H) 0.0 - 0.2 %   Neutrophils Relative % 87 %   Neutro Abs 11.1 (H) 1.7 - 7.7 K/uL   Lymphocytes Relative 9 %   Lymphs Abs 1.1 0.7 - 4.0 K/uL   Monocytes Relative 3 %   Monocytes Absolute 0.4 0.1 - 1.0 K/uL   Eosinophils Relative 0 %   Eosinophils Absolute 0.0 0.0 - 0.5 K/uL   Basophils Relative 1 %   Basophils Absolute 0.1 0.0 - 0.1 K/uL   Immature Granulocytes 0 %   Abs Immature Granulocytes  0.04 0.00 - 0.07 K/uL   Polychromasia PRESENT    Sickle Cells PRESENT    Target Cells PRESENT     Comment: Performed at [facility], [place] [address], [place], [place] [address]  Comprehensive metabolic panel     Status: Abnormal   Collection Time: 04/28/19  3:23 PM  Result Value Ref Range   Sodium 139 135 - 145 mmol/L   Potassium 2.7 (LL) 3.5 - 5.1 mmol/L    Comment: CRITICAL RESULT CALLED TO, READ BACK BY AND VERIFIED WITH: BEIGUM,S RN @1607  ON 04/28/2019 JACKSON,K    Chloride 104 98 - 111 mmol/L   CO2 25 22 - 32 mmol/L   Glucose, Bld 122 (H) 70 - 99 mg/dL   BUN 8 6 - 20 mg/dL   Creatinine, Ser 0.72 0.44 - 1.00 mg/dL   Calcium 9.7 8.9 - 10.3 mg/dL   Total Protein 8.4 (H) 6.5 - 8.1 g/dL   Albumin 4.8 3.5 - 5.0 g/dL   AST 23 15 - 41 U/L   ALT 13 0 - 44 U/L   Alkaline Phosphatase 56 38 - 126 U/L   Total Bilirubin 3.8 (H) 0.3 - 1.2 mg/dL   GFR calc non Af Amer >60 >60 mL/min   GFR calc Af Amer >60 >60 mL/min   Anion gap 10 5 - 15    Comment: Performed at [facility], [place] [address], [place], [place] [address]  Lipase, blood     Status: None   Collection Time: 04/28/19  3:23 PM  Result Value Ref Range   Lipase 33 11 - 51 U/L    Comment: Performed at [facility], [place] [address], [place], [place] [address]  Magnesium     Status: None   Collection Time: 04/28/19  3:23 PM  Result Value Ref Range   Magnesium 1.9 1.7 - 2.4 mg/dL    Comment: Performed at [facility], [place] [address] [place], [place] [address]  Reticulocytes     Status: Abnormal   Collection Time: 04/28/19  3:23 PM  Result Value Ref Range   Retic Ct Pct 3.8 (H) 0.4 - 3.1 %   RBC. 3.36 (L) 3.87 - 5.11 MIL/uL   Retic Count, Absolute 126.3 19.0 - 186.0 K/uL  Immature Retic Fract 24.8 (H) 2.3 - 15.9 %    Comment: Performed at [facility], [place] [address], [place], [place] [address]  SARS Coronavirus 2 (CEPHEID - Performed  in [facility] hospital lab), Hosp Order     Status: None   Collection Time: 04/28/19  7:16 PM  Result Value Ref Range   SARS Coronavirus 2 NEGATIVE NEGATIVE    Comment: (NOTE) If result is NEGATIVE SARS-CoV-2 target nucleic acids are NOT DETECTED. The SARS-CoV-2 RNA is generally detectable in upper and lower  respiratory specimens during the acute phase of infection. The lowest  concentration of SARS-CoV-2 viral copies this assay can detect is 250  copies / mL. A negative result does not preclude SARS-CoV-2 infection  and should not be used as the sole basis for treatment or other  patient management decisions.  A negative result may occur with  improper specimen collection / handling, submission of specimen other  than nasopharyngeal swab, presence of viral mutation(s) within the  areas targeted by this assay, and inadequate number of viral copies  (<250 copies / mL). A negative result must be combined with clinical  observations, patient history, and epidemiological information. If result is POSITIVE SARS-CoV-2 target nucleic acids are DETECTED. The SARS-CoV-2 RNA is generally detectable in upper and lower  respiratory specimens dur ing the acute phase of infection.  Positive  results are indicative of active infection with SARS-CoV-2.  Clinical  correlation with patient history and other diagnostic information is  necessary to determine patient infection status.  Positive results do  not rule out bacterial infection or co-infection with other viruses. If result is PRESUMPTIVE POSTIVE SARS-CoV-2 nucleic acids MAY BE PRESENT.   A presumptive positive result was obtained on the submitted specimen  and confirmed on repeat testing.  While 2019 novel coronavirus  (SARS-CoV-2) nucleic acids may be present in the submitted sample  additional confirmatory testing may be necessary for epidemiological  and / or clinical management purposes  to differentiate between  SARS-CoV-2 and other  Sarbecovirus currently known to infect humans.  If clinically indicated additional testing with an alternate test  methodology [phone]) is advised. The SARS-CoV-2 RNA is generally  detectable in upper and lower respiratory sp ecimens during the acute  phase of infection. The expected result is Negative. Fact Sheet for Patients:  StrictlyIdeas.no Fact Sheet for Healthcare Providers: BankingDealers.co.za This test is not yet approved or cleared by the [place] FDA and has been authorized for detection and/or diagnosis of SARS-CoV-2 by FDA under an Emergency Use Authorization (EUA).  This EUA will remain in effect (meaning this test can be used) for the duration of the COVID-19 declaration under Section 564(b)(1) of the Act, 21 U.S.C. section 360bbb-3(b)(1), unless the authorization is terminated or revoked sooner. Performed at [facility], [place] [address], [place], [place] [address]    No results found.  Pending Labs Unresulted Labs (From admission, onward)    Start     Ordered   04/29/19 0500  Lipid panel  Tomorrow morning,   R     04/28/19 2114   04/28/19 1942  Troponin I - Add-On to previous collection  Add-on,   R     04/28/19 1941   04/28/19 1915  Magnesium  Add-on,   R     04/28/19 1915   04/28/19 1915  Phosphorus  Add-on,   R     04/28/19 1915   04/28/19 1637  Urine culture  Add-on,  STAT     04/28/19 1636          Vitals/Pain Today's Vitals   04/28/19 1630 04/28/19 1645 04/28/19 1843 04/28/19 1909  BP: (!) 168/93   (!) 147/97  Pulse: 83   61  Resp: 17   19  Temp:      TempSrc:      SpO2: 92%   91%  PainSc:  6  6      Isolation Precautions No active isolations  Medications Medications  0.9 %  sodium chloride infusion (500 mLs Intravenous New Bag/Given 04/28/19 1650)  potassium chloride 10 mEq in 100 mL IVPB (10 mEq Intravenous New Bag/Given 04/28/19 2055)  HYDROmorphone (DILAUDID)  injection 1 mg (1 mg Intravenous Given 04/28/19 2051)  metoCLOPramide (REGLAN) injection 5 mg (5 mg Intravenous Given 04/28/19 2049)  sodium chloride 0.9 % bolus 1,000 mL (0 mLs Intravenous Stopped 04/28/19 1648)  morphine 4 MG/ML injection 4 mg (4 mg Intravenous Given 04/28/19 1534)  ondansetron (ZOFRAN) injection 4 mg (4 mg Intravenous Given 04/28/19 1532)  potassium chloride SA (K-DUR) CR tablet 40 mEq (40 mEq Oral Given 04/28/19 1646)  ondansetron (ZOFRAN) injection 4 mg (4 mg Intravenous Given 04/28/19 1649)  morphine 4 MG/ML injection 4 mg (4 mg Intravenous Given 04/28/19 1652)  cefTRIAXone (ROCEPHIN) 1 g in sodium chloride 0.9 % 100 mL IVPB (0 g Intravenous Stopped 04/28/19 1721)    Mobility walks Low fall risk   Focused Assessments Sickle Cell   R Recommendations: See Admitting Provider Note  Report given to:   Additional Notes: Patient has a history of SCC.

## 2019-04-28 NOTE — ED Provider Notes (Signed)
[facility] DEPT Provider Note   CSN: [id number] Arrival date & time: 04/28/19  1404    History   Chief Complaint Chief Complaint  Patient presents with  . Sickle Cell Pain Crisis    HPI Brittany Shannon is a 48 y.o. female.     Patient is a 48 year old female with past medical history of sickle cell anemia, delusional disorder, asthma who presents the emergency department for sickle cell pain.  Patient reports that this started around 8 AM and she began to have spasms in her lower back.  Also reports abdominal pain with nausea and vomiting.  Reports that this is very typical of sickle cell flares in the past.  She was last seen in the hospital for this about a year ago.  Reports that she only takes ibuprofen for the symptoms which did not help today.  Denies any chest pain or shortness of breath.  She does endorse a subjective fever without cough.  Denies any constipation, diarrhea, dysuria.     Past Medical History:  Diagnosis Date  . Asthma   . 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 Homelessness   . Hypertension   . Sickle cell anemia [facility])     Patient Active Problem List   Diagnosis Date Noted  . Hypokalemia 04/28/2019  . Nausea and vomiting 04/28/2019  . Sickle cell anemia with crisis ([place]) 04/28/2019  . QT prolongation 04/28/2019  . Sickle cell crisis ([place]) 04/28/2019  . Delusional disorder ([place]) 10/24/2016  . Chest pain 07/14/2016  . 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 Asthma 07/14/2016  . HTN (hypertension) 07/14/2016    Past Surgical History:  Procedure Laterality Date  . CHOLECYSTECTOMY    . EYE SURGERY    . SPLENECTOMY, TOTAL       OB History   No obstetric history on file.      Home Medications    Prior to Admission medications   Not on File    Family History Family History  Problem Relation Age of Onset  . Sickle cell trait Other     Social History Social History   Tobacco Use  . Smoking status: Current  Every Day Smoker    Types: Cigarettes  . Smokeless tobacco: Never Used  Substance Use Topics  . Alcohol use: No  . Drug use: No     Allergies   Patient has no known allergies.   Review of Systems Review of Systems  Constitutional: Positive for appetite change and fever. Negative for activity change, chills, diaphoresis, fatigue and unexpected weight change.  HENT: Negative for congestion and sore throat.   Respiratory: Negative for apnea, cough and shortness of breath.   Cardiovascular: Negative for chest pain.  Gastrointestinal: Positive for abdominal pain, nausea and vomiting. Negative for anal bleeding, blood in stool, constipation and diarrhea.  Endocrine: Negative for polyuria.  Genitourinary: Negative for dysuria and pelvic pain.  Musculoskeletal: Positive for back pain. Negative for arthralgias.  Skin: Negative for rash.  Allergic/Immunologic: Negative for immunocompromised state.  Neurological: Negative for dizziness, light-headedness and headaches.  Psychiatric/Behavioral: Negative for confusion.     Physical Exam Updated Vital Signs BP (!) 148/98   Pulse 80   Temp 97.7 F (36.5 C) (Oral)   Resp (!) 23   LMP 04/07/2013 Comment: neg preg 11/03/16  SpO2 97%   Physical Exam   ED Treatments / Results  Labs (all labs ordered are listed, but only abnormal results are displayed) Labs Reviewed  CBC  WITH DIFFERENTIAL/PLATELET - Abnormal; Notable for the following components:      Result Value   WBC 12.7 (*)    RBC 3.34 (*)    Hemoglobin 10.9 (*)    HCT 28.2 (*)    MCHC 38.7 (*)    nRBC 0.4 (*)    Neutro Abs 11.1 (*)    All other components within normal limits  COMPREHENSIVE METABOLIC PANEL - Abnormal; Notable for the following components:   Potassium 2.7 (*)    Glucose, Bld 122 (*)    Total Protein 8.4 (*)    Total Bilirubin 3.8 (*)    All other components within normal limits  URINALYSIS, ROUTINE W REFLEX MICROSCOPIC - Abnormal; Notable for the  following components:   APPearance HAZY (*)    Hgb urine dipstick SMALL (*)    Protein, ur 30 (*)    Leukocytes,Ua LARGE (*)    Bacteria, UA RARE (*)    All other components within normal limits  RETICULOCYTES - Abnormal; Notable for the following components:   Retic Ct Pct 3.8 (*)    RBC. 3.36 (*)    Immature Retic Fract 24.8 (*)    All other components within normal limits  SARS CORONAVIRUS 2 (HOSPITAL ORDER, [place] [facility])  URINE CULTURE  LIPASE, BLOOD  MAGNESIUM  MAGNESIUM  PHOSPHORUS  TROPONIN I  LIPID PANEL  RAPID URINE DRUG SCREEN, HOSP PERFORMED    EKG EKG Interpretation  Date/Time:  Monday Apr 28 2019 16:57:27 EDT Ventricular Rate:  68 PR Interval:    QRS Duration: 91 QT Interval:  486 QTC Calculation: 517 R Axis:   81 Text Interpretation:  Sinus rhythm Ventricular premature complex Left ventricular hypertrophy Prolonged QT interval Baseline wander in lead(s) V3 No significant change since last tracing Confirmed by Dorie Rank [phone]) on 04/28/2019 5:15:34 PM   Radiology No results found.  Procedures Procedures (including critical care time)  Medications Ordered in ED Medications  0.9 %  sodium chloride infusion (500 mLs Intravenous New Bag/Given 04/28/19 1650)  potassium chloride 10 mEq in 100 mL IVPB (10 mEq Intravenous New Bag/Given 04/28/19 2055)  HYDROmorphone (DILAUDID) injection 1 mg (1 mg Intravenous Given 04/28/19 2051)  metoCLOPramide (REGLAN) injection 5 mg (5 mg Intravenous Given 04/28/19 2049)  lisinopril (ZESTRIL) tablet 10 mg (has no administration in time range)  sodium chloride 0.9 % bolus 1,000 mL (0 mLs Intravenous Stopped 04/28/19 1648)  morphine 4 MG/ML injection 4 mg (4 mg Intravenous Given 04/28/19 1534)  ondansetron (ZOFRAN) injection 4 mg (4 mg Intravenous Given 04/28/19 1532)  potassium chloride SA (K-DUR) CR tablet 40 mEq (40 mEq Oral Given 04/28/19 1646)  ondansetron (ZOFRAN) injection 4 mg (4 mg Intravenous Given  04/28/19 1649)  morphine 4 MG/ML injection 4 mg (4 mg Intravenous Given 04/28/19 1652)  cefTRIAXone (ROCEPHIN) 1 g in sodium chloride 0.9 % 100 mL IVPB (0 g Intravenous Stopped 04/28/19 1721)     Initial Impression / Assessment and Plan / ED Course  I have reviewed the triage vital signs and the nursing notes.  Pertinent labs & imaging results that were available during my care of the patient were reviewed by me and considered in my medical decision making (see chart for details).  Clinical Course as of Apr 27 2140  Mon Apr 28, 2019  1605 I reassessed the patient at this time.  She reports that she is feeling better but still little bit nauseated.   [KM]  0102 Patient labs significant  for potassium of 2.7, magnesium normal. UA showing +leuk, WBC and bacteria. Given her symptoms I will treat with IV rocephin for UTI and oral potassium, 2meq.  Her EKG just show a prolonged QTc over 500 which is new since her last EKG in 2017.  She reports still feeling unwell after morphine and Zofran.  Will consult the hospitalist for admission.   [KM]  56 Spoke with hospitalist who agrees to admit patient   [KM]    Clinical Course User Index [KM] Alveria Apley, PA-C         Final Clinical Impressions(s) / ED Diagnoses   Final diagnoses:  Hypokalemia  Sickle cell pain crisis [facility])    ED Discharge Orders    None       Kristine Royal 04/28/19 2141    Francine Graven, DO 05/03/19 1630

## 2019-04-29 LAB — RAPID URINE DRUG SCREEN, HOSP PERFORMED
Amphetamines: NOT DETECTED
Barbiturates: NOT DETECTED
Benzodiazepines: NOT DETECTED
Cocaine: NOT DETECTED
Opiates: NOT DETECTED
Tetrahydrocannabinol: POSITIVE — AB

## 2019-04-29 LAB — BASIC METABOLIC PANEL
Anion gap: 6 (ref 5–15)
BUN: 6 mg/dL (ref 6–20)
CO2: 27 mmol/L (ref 22–32)
Calcium: 9.3 mg/dL (ref 8.9–10.3)
Chloride: 105 mmol/L (ref 98–111)
Creatinine, Ser: 0.66 mg/dL (ref 0.44–1.00)
GFR calc Af Amer: >60 mL/min (ref >60)
GFR calc non Af Amer: >60 mL/min (ref >60)
Glucose, Bld: 100 mg/dL — ABNORMAL HIGH (ref 70–99)
Potassium: 2.9 mmol/L — ABNORMAL LOW (ref 3.5–5.1)
Sodium: 138 mmol/L (ref 135–145)

## 2019-04-29 LAB — HIV ANTIBODY (ROUTINE TESTING W REFLEX): HIV Screen 4th Generation wRfx: NONREACTIVE

## 2019-04-29 LAB — PHOSPHORUS: Phosphorus: 2.6 mg/dL (ref 2.5–4.6)

## 2019-04-29 LAB — LIPID PANEL
Cholesterol: 119 mg/dL (ref 0–200)
HDL: 37 mg/dL — ABNORMAL LOW (ref >40)
LDL Cholesterol: 70 mg/dL (ref 0–99)
Total CHOL/HDL Ratio: 3.2 RATIO
Triglycerides: 62 mg/dL (ref <150)
VLDL: 12 mg/dL (ref 0–40)

## 2019-04-29 LAB — MAGNESIUM: Magnesium: 1.7 mg/dL (ref 1.7–2.4)

## 2019-04-29 MED ORDER — FOLIC ACID 1 MG PO TABS: 1.0000 mg | ORAL_TABLET | Freq: Every day | ORAL | 0 refills | Status: DC

## 2019-04-29 MED ORDER — POTASSIUM CHLORIDE ER 20 MEQ PO TBCR: 10.0000 meq | EXTENDED_RELEASE_TABLET | Freq: Every day | ORAL | 0 refills | Status: DC

## 2019-04-29 MED ORDER — METOCLOPRAMIDE HCL 5 MG PO TABS: 5.0000 mg | ORAL_TABLET | Freq: Four times a day (QID) | ORAL | 0 refills | Status: DC | PRN

## 2019-04-29 MED ORDER — AMOXICILLIN-POT CLAVULANATE 875-125 MG PO TABS: 1.0000 | ORAL_TABLET | Freq: Two times a day (BID) | ORAL | 0 refills | Status: AC

## 2019-04-29 MED ORDER — POTASSIUM CHLORIDE CRYS ER 20 MEQ PO TBCR
40.0000 meq | EXTENDED_RELEASE_TABLET | Freq: Once | ORAL | Status: AC
  Administered 2019-04-29: 15:00:00 40 meq via ORAL
  Filled 2019-04-29: qty 2

## 2019-04-29 NOTE — Discharge Summary (Addendum)
Physician Discharge Summary  Zeffie Bickert VEL:[id number] DOB: 11-26-1971 DOA: 04/28/2019  PCP: System, Provider Not In  Admit date: 04/28/2019  Discharge date: 04/29/2019  Discharge Diagnoses:  Active Problems:   Hemoglobin S-C disease (HCC)   Asthma   HTN (hypertension)   Hypokalemia   Nausea and vomiting   Sickle cell anemia with crisis (HCC)   QT prolongation   Sickle cell crisis [facility])   Discharge Condition: Stable  Disposition:  Follow-up [place] Follow up.   Specialty:  Internal Medicine Why:  Schedule follow up appointment with the Patient [place] in 1 week. Repeat BMP and CBC with differential.  Contact information: [place] [id number] [place] [phone] [place] .   Specialty:  Internal Medicine Contact information: [place] [phone]         Pt is discharged home in good condition and is to follow up with [place] this week to have labs evaluated. 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  Prior to admission, patient was unable to keep foods down.  Patient was unable to give a thorough history due to complete hearing loss.  Her cochlear implant has not been working well and patient has  bilateral hearing aids, but battery is dead so patient is a poor historian.  Patient typically controls sickle cell pain with over-the-counter analgesics.  Patient has rare admissions concerning sickle cell.  She has a history of hypertension and has not been on medications in a while.  Patient also has a history of asthma that is well controlled.  Patient denies fever, chills, sick contacts, or recent travel.  Patient also denied exposure to COVID-19.  ER course: WBCs 12.7, hemoglobin 10.9, which was consistent with patient's baseline.  Patient's potassium was 2.7 and EKG showed prolonged QTC of 521, which is a new finding. Patient received morphine, Zofran, and IV fluids.  Also, Rocephin to treat UTI, urinalysis positive, culture obtained.   Hospital Course:  Ms. Kendricks is extremely hard of hearing.  Patient has a nonworking cochlear implant and batteries are dead in hearing aid.  Writing on clipboard to communicate with patient.  Patient typically reads lips, but she has been unable to due to universal masking. Ms. Humphreys is requesting discharge on today.   Nausea and vomiting: Patient presented with nausea and vomiting for greater than 24 hours.  On admission, potassium was decreased to 2.7 and ECG showed prolonged QTC.  Patient was treated with supportive care including IV fluids, potassium repletion, and several rounds of IV potassium.  Potassium level increased to 2.9.  Patient will be discharged with K. Dur 20 mEq daily for 7 days.  At that time, patient will establish care with PCP and have labs redrawn.  Recommend BMP and magnesium level.  Patient was  also treated with antiemetics.  Due to prolonged QTC, Reglan was initiated.  Nausea has mostly resolved.  Since Reglan 5 mg #30 to patient's pharmacy.  Sickle cell disease: Patient's pain is completely resolved.  She typically manages at home with OTC analgesics.  Patient is opiate nave and was started on low-dose PCA.  She only used 0.6 mg  over the past 24 hours.  Patient will continue to manage with OTC medications.  Also, patient is followed by [facility] hematology for sickle cell care.  Urinary tract infection: Patient has large leukocytes.  Urine culture obtained.  Patient treated with 1 g of Rocephin.  Augmentin 875-125 mg every 12 hours was continued to complete treatment.  Will follow urine culture and call patient with results.  Pain is currently resolved.  Patient is aware that she will need to schedule an appointment to establish care and get labs in 1 week.  Patient will discharge home in a hemodynamically stable condition.   Discharge Exam: Vitals:   04/29/19 0506 04/29/19 1454  BP: 136/76 (!) 142/74  Pulse: 83 76  Resp: 20   Temp: 99.1 F (37.3 C) 99.8 F (37.7 C)  SpO2: 91% 95%   Vitals:   04/29/19 0004 04/29/19 0414 04/29/19 0506 04/29/19 1454  BP: (!) 145/89  136/76 (!) 142/74  Pulse: 79  83 76  Resp: 16 11 20    Temp: 98.8 F (37.1 C)  99.1 F (37.3 C) 99.8 F (37.7 C)  TempSrc:    Oral  SpO2: 93% 90% 91% 95%  Weight:      Height:         Discharge Instructions  Discharge Instructions    Discharge patient   Complete by:  As directed    Discharge disposition:  01-Home or Self Care   Discharge patient date:  04/29/2019     Allergies as of 04/29/2019   No Known Allergies     Medication List    TAKE these medications   amoxicillin-clavulanate 875-125 MG tablet Commonly known as:  Augmentin Take 1 tablet by mouth 2 (two) times daily for 10 days.   folic acid 1 MG tablet Commonly known as:  FOLVITE Take 1 tablet (1 mg total) by mouth daily. Start taking on:  Apr 30, 2019   metoCLOPramide 5 MG tablet Commonly known as:  Reglan Take 1 tablet (5 mg total) by mouth every 6 (six) hours as needed for nausea.   Potassium Chloride ER 20 MEQ Tbcr Take 10 mEq by mouth daily.       The results of significant diagnostics from this hospitalization (including imaging, microbiology,  ancillary and laboratory) are listed below for reference.    Significant Diagnostic Studies: Dg Abd 1 View  Result Date: 04/28/2019 CLINICAL DATA:  Intractable nausea and vomiting. EXAM: ABDOMEN - 1 VIEW COMPARISON:  CT abdomen pelvis dated November 03, 2016. Abdominal x-ray dated October 24, 2016. FINDINGS: Mild gaseous distention of the stomach. The bowel gas pattern is normal. No radio-opaque calculi or other significant radiographic abnormality are seen. No acute osseous abnormality. The lung bases are clear. IMPRESSION: No acute findings. Electronically Signed   By: Titus Dubin M.D.   On: 04/28/2019 21:58    Microbiology: Recent Results (from the past 240 hour(s))  SARS Coronavirus 2 (CEPHEID - Performed in [place] hospital lab), Hosp Order     Status: None   Collection Time: 04/28/19  7:16 PM  Result Value Ref Range Status   SARS Coronavirus 2 NEGATIVE  NEGATIVE Final    Comment: (NOTE) If result is NEGATIVE SARS-CoV-2 target nucleic acids are NOT DETECTED. The SARS-CoV-2 RNA is generally detectable in upper and lower  respiratory specimens during the acute phase of infection. The lowest  concentration of SARS-CoV-2 viral copies this assay can detect is 250  copies / mL. A negative result does not preclude SARS-CoV-2 infection  and should not be used as the sole basis for treatment or other  patient management decisions.  A negative result may occur with  improper specimen collection / handling, submission of specimen other  than nasopharyngeal swab, presence of viral mutation(s) within the  areas targeted by this assay, and inadequate number of viral copies  (<250 copies / mL). A negative result must be combined with clinical  observations, patient history, and epidemiological information. If result is POSITIVE SARS-CoV-2 target nucleic acids are DETECTED. The SARS-CoV-2 RNA is generally detectable in upper and lower  respiratory specimens dur ing the acute phase of  infection.  Positive  results are indicative of active infection with SARS-CoV-2.  Clinical  correlation with patient history and other diagnostic information is  necessary to determine patient infection status.  Positive results do  not rule out bacterial infection or co-infection with other viruses. If result is PRESUMPTIVE POSTIVE SARS-CoV-2 nucleic acids MAY BE PRESENT.   A presumptive positive result was obtained on the submitted specimen  and confirmed on repeat testing.  While 2019 novel coronavirus  (SARS-CoV-2) nucleic acids may be present in the submitted sample  additional confirmatory testing may be necessary for epidemiological  and / or clinical management purposes  to differentiate between  SARS-CoV-2 and other Sarbecovirus currently known to infect humans.  If clinically indicated additional testing with an alternate test  methodology [phone]) is advised. The SARS-CoV-2 RNA is generally  detectable in upper and lower respiratory sp ecimens during the acute  phase of infection. The expected result is Negative. Fact Sheet for Patients:  StrictlyIdeas.no Fact Sheet for Healthcare Providers: BankingDealers.co.za This test is not yet approved or cleared by the [place] FDA and has been authorized for detection and/or diagnosis of SARS-CoV-2 by FDA under an Emergency Use Authorization (EUA).  This EUA will remain in effect (meaning this test can be used) for the duration of the COVID-19 declaration under Section 564(b)(1) of the Act, 21 U.S.C. section 360bbb-3(b)(1), unless the authorization is terminated or revoked sooner. Performed at [facility], [place] [address], [place], [place] [address]      Labs: Basic Metabolic Panel: Recent Labs  Lab 04/28/19 1523 04/28/19 1917 04/29/19 0734 04/29/19 1407  NA 139  --   --  138  K 2.7*  --   --  2.9*  CL 104  --   --  105  CO2 25  --   --  27   GLUCOSE 122*  --   --  100*  BUN 8  --   --  6  CREATININE 0.72  --   --  0.66  CALCIUM 9.7  --   --  9.3  MG 1.9 1.6* 1.7  --   PHOS  --  3.5 2.6  --    Liver Function Tests: Recent Labs  Lab 04/28/19 1523  AST 23  ALT 13  ALKPHOS 56  BILITOT 3.8*  PROT 8.4*  ALBUMIN 4.8   Recent Labs  Lab 04/28/19 1523  LIPASE 33   No results for input(s): AMMONIA in the last 168 hours. CBC: Recent  Labs  Lab 04/28/19 1523  WBC 12.7*  NEUTROABS 11.1*  HGB 10.9*  HCT 28.2*  MCV 84.4  PLT 265   Cardiac Enzymes: Recent Labs  Lab 04/28/19 2000  TROPONINI <0.03   BNP: Invalid input(s): POCBNP CBG: No results for input(s): GLUCAP in the last 168 hours.  Time coordinating discharge: 50 minutes  Signed:  Donia Pounds  APRN, MSN, FNP-C [facility] [place], [place] [address] [phone]  [facility] 04/29/2019, 3:16 PM

## 2019-04-29 NOTE — Progress Notes (Signed)
Brittany Shannon to be D/C'd Home per MD order.  Discussed prescriptions and follow up appointments with the patient. Prescriptions given to patient, medication list explained in detail. Pt verbalized understanding.  Allergies as of 04/29/2019   No Known Allergies     Medication List    TAKE these medications   amoxicillin-clavulanate 875-125 MG tablet Commonly known as:  Augmentin Take 1 tablet by mouth 2 (two) times daily for 10 days.   folic acid 1 MG tablet Commonly known as:  FOLVITE Take 1 tablet (1 mg total) by mouth daily. Start taking on:  Apr 30, 2019   metoCLOPramide 5 MG tablet Commonly known as:  Reglan Take 1 tablet (5 mg total) by mouth every 6 (six) hours as needed for nausea.   Potassium Chloride ER 20 MEQ Tbcr Take 10 mEq by mouth daily.       Vitals:   04/29/19 0506 04/29/19 1454  BP: 136/76 (!) 142/74  Pulse: 83 76  Resp: 20   Temp: 99.1 F (37.3 C) 99.8 F (37.7 C)  SpO2: 91% 95%    Skin clean, dry and intact without evidence of skin break down, no evidence of skin tears noted. IV catheter discontinued intact. Site without signs and symptoms of complications. Dressing and pressure applied. Pt denies pain at this time. No complaints noted.  An After Visit Summary was printed and given to the patient. Patient escorted via [place], and D/C home via private auto.  Brittany Shannon S 04/29/2019 4:12 PM

## 2019-04-29 NOTE — Progress Notes (Signed)
New Admission Note:    Arrival Method: Bed Mental Orientation:  A & O X 4 Telemetry: box 53 Assessment:  complete Skin:  Dry, intact Iv: left AC & Right hand Pain:  6/10, PCA and heat applied  Tubes:  Safety Measures: Safety Fall Prevention Plan has been given, discussed and signed Admission: Completed [facility] Orientation: Patient has been orientated to the room, unit, and staff. Clipboard for written communication at bedside.    Orders have been reviewed and implemented. Patient has no questions.  Will continue to monitor the patient. Call light has been placed within reach.   Tawni Carnes, RN Phone number: [phone]

## 2019-04-29 NOTE — Discharge Instructions (Signed)
Your potassium is low which is suspected to be related to the increased vomiting over the last several days.  Will continue to replace potassium with K. Dur 20 mEq daily.  You will need to establish care with a PCP at the patient care center.  At that time, you will have your labs checked.  Continue folic acid 1 mg daily, this is for your sickle cell maintenance.  You have a history of hypertension, your PCP will need to further assess.  I will continue to treat you empirically for a urinary tract infection.  I will follow your urine culture and call you with results.  Please take Augmentin 875-125 mg every 12 hours for 10 days.  I recommend advancing your diet as tolerated.   Nausea and Vomiting, Adult Nausea is feeling sick to your stomach or feeling that you are about to throw up (vomit). Vomiting is when food in your stomach is thrown up and out of the mouth. Throwing up can make you feel weak. It can also make you lose too much water in your body (get dehydrated). If you lose too much water in your body, you may:  Feel tired.  Feel thirsty.  Have a dry mouth.  Have cracked lips.  Go pee (urinate) less often. Older adults and people with other diseases or a weak body defense system (immune system) are at higher risk for losing too much water in the body. If you feel sick to your stomach and you throw up, it is important to follow instructions from your doctor about how to take care of yourself. Follow these instructions at home: Watch your symptoms for any changes. Tell your doctor about them. Follow these instructions to care for yourself at home. Eating and drinking      Take an ORS (oral rehydration solution). This is a drink that is sold at pharmacies and stores.  Drink clear fluids in small amounts as you are able, such as: ? Water. ? Ice chips. ? Fruit juice that has water added (diluted fruit juice). ? Low-calorie sports drinks.  Eat bland, easy-to-digest foods in  small amounts as you are able, such as: ? Bananas. ? Applesauce. ? Rice. ? Low-fat (lean) meats. ? Toast. ? Crackers.  Avoid drinking fluids that have a lot of sugar or caffeine in them. This includes energy drinks, sports drinks, and soda.  Avoid alcohol.  Avoid spicy or fatty foods. General instructions  Take over-the-counter and prescription medicines only as told by your doctor.  Drink enough fluid to keep your pee (urine) pale yellow.  Wash your hands often with soap and water. If you cannot use soap and water, use hand sanitizer.  Make sure that all people in your home wash their hands well and often.  Rest at home while you get better.  Watch your condition for any changes.  Take slow and deep breaths when you feel sick to your stomach.  Keep all follow-up visits as told by your doctor. This is important. Contact a doctor if:  Your symptoms get worse.  You have new symptoms.  You have a fever.  You cannot drink fluids without throwing up.  You feel sick to your stomach for more than 2 days.  You feel light-headed or dizzy.  You have a headache.  You have muscle cramps.  You have a rash.  You have pain while peeing. Get help right away if:  You have pain in your chest, neck, arm, or jaw.  You  feel very weak or you pass out (faint).  You throw up again and again.  You have throw up that is bright red or looks like black coffee grounds.  You have bloody or black poop (stools) or poop that looks like tar.  You have a very bad headache, a stiff neck, or both.  You have very bad pain, cramping, or bloating in your belly (abdomen).  You have trouble breathing.  You are breathing very quickly.  Your heart is beating very quickly.  Your skin feels cold and clammy.  You feel confused.  You have signs of losing too much water in your body, such as: ? Dark pee, very little pee, or no pee. ? Cracked lips. ? Dry mouth. ? Sunken  eyes. ? Sleepiness. ? Weakness. These symptoms may be an emergency. Do not wait to see if the symptoms will go away. Get medical help right away. Call your local emergency services (911 in the [place] Do not drive yourself to the hospital. Summary  Nausea is feeling sick to your stomach or feeling that you are about to throw up (vomit). Vomiting is when food in your stomach is thrown up and out of the mouth.  Follow instructions from your doctor about eating and drinking to keep from losing too much water in your body.  Take over-the-counter and prescription medicines only as told by your doctor.  Contact your doctor if your symptoms get worse or you have new symptoms.  Keep all follow-up visits as told by your doctor. This is important. This information is not intended to replace advice given to you by your health care provider. Make sure you discuss any questions you have with your health care provider. Document Released: 05/15/2008 Document Revised: 05/07/2018 Document Reviewed: 05/07/2018 Elsevier Interactive Patient Education  2019 [employer].

## 2019-04-30 LAB — URINE CULTURE

## 2019-05-06 NOTE — Progress Notes (Signed)
Patient's chart accessed due to patient having question regarding medication at discharge.

## 2019-07-05 ENCOUNTER — Encounter (HOSPITAL_COMMUNITY): Payer: Self-pay

## 2019-07-05 ENCOUNTER — Emergency Department (HOSPITAL_COMMUNITY)
Admission: EM | Admit: 2019-07-05 | Discharge: 2019-07-05 | Disposition: A | Discharge status: Home / Self Care | Payer: Medicare Other | Attending: Emergency Medicine | Admitting: Emergency Medicine

## 2019-07-05 ENCOUNTER — Other Ambulatory Visit: Payer: Self-pay

## 2019-07-05 DIAGNOSIS — H9202 Otalgia, left ear: Secondary | ICD-10-CM | POA: Diagnosis present

## 2019-07-05 DIAGNOSIS — J45909 Unspecified asthma, uncomplicated: Secondary | ICD-10-CM | POA: Diagnosis not present

## 2019-07-05 DIAGNOSIS — I10 Essential (primary) hypertension: Secondary | ICD-10-CM | POA: Diagnosis not present

## 2019-07-05 DIAGNOSIS — H6092 Unspecified otitis externa, left ear: Secondary | ICD-10-CM | POA: Diagnosis not present

## 2019-07-05 DIAGNOSIS — Z590 Homelessness: Secondary | ICD-10-CM | POA: Diagnosis not present

## 2019-07-05 DIAGNOSIS — H60502 Unspecified acute noninfective otitis externa, left ear: Secondary | ICD-10-CM

## 2019-07-05 DIAGNOSIS — F17210 Nicotine dependence, cigarettes, uncomplicated: Secondary | ICD-10-CM | POA: Diagnosis not present

## 2019-07-05 MED ORDER — CIPROFLOXACIN-DEXAMETHASONE 0.3-0.1 % OT SUSP
4.0000 [drp] | Freq: Once | OTIC | Status: AC
  Administered 2019-07-05: 4 [drp] via OTIC
  Filled 2019-07-05: qty 7.5

## 2019-07-05 NOTE — Discharge Instructions (Signed)
Use antibiotic eardrops, 4 drops daily, twice a day for the next 7 to 10 days.  Over the next 3 to 4 days if your symptoms or not improving please follow-up with your primary care doctor if you have worsening ear pain, fevers or increasing drainage despite antibiotics or you develop swelling around her outer ear return to the ED for reevaluation.

## 2019-07-05 NOTE — ED Provider Notes (Signed)
[facility] EMERGENCY DEPARTMENT Provider Note   CSN: [id number] Arrival date & time: 07/05/19  2019     History   Chief Complaint Chief Complaint  Patient presents with  . Otalgia  . Ear Drainage    HPI Brittany Shannon is a 48 y.o. female.     Brittany Shannon is a 48 y.o. 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  Patient denies any injury to the left ear.  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  Reports she started having pain in her ear 4 days ago and then began having initially white discharge which then turned to yellow.  She reports some pain just in front of the ear, no swelling of the ear.  Reports she has had some chills but no measured fever.  No pain in the right ear.  No history of diabetes.  No associated headaches or vision changes.  No nausea or vomiting.  She has not taken anything to treat her symptoms prior to arrival, no other aggravating or alleviating factors.     Past Medical History:  Diagnosis Date  . Asthma   . 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 Homelessness   . Hypertension   . Sickle cell anemia [facility])     Patient Active Problem List   Diagnosis Date Noted  . Hypokalemia 04/28/2019  . Intractable nausea and vomiting 04/28/2019  . Sickle cell anemia with crisis ([place]) 04/28/2019  . QT prolongation 04/28/2019  . Sickle cell pain crisis ([place]) 04/28/2019  . Delusional disorder ([place]) 10/24/2016  . Chest pain 07/14/2016  . Hemoglobin S-C disease ([place]) 07/14/2016  . Asthma 07/14/2016  . HTN (hypertension) 07/14/2016    Past Surgical History:  Procedure Laterality Date  . CHOLECYSTECTOMY    . EYE SURGERY    . SPLENECTOMY, TOTAL       OB History   No obstetric history on file.      Home Medications    Prior  to Admission medications   Medication Sig Start Date End Date Taking? Authorizing Provider  acetaminophen (TYLENOL) 500 MG tablet Take 500 mg by mouth every 4 (four) hours as needed for mild pain.   Yes [provider]    Family History Family History  Problem Relation Age of Onset  . Sickle cell trait Other     Social History Social History   Tobacco Use  . Smoking status: Current Every Day Smoker    Types: Cigarettes  . Smokeless tobacco: Never Used  Substance Use Topics  . Alcohol use: No  . Drug use: No     Allergies   Patient has no known allergies.   Review of Systems Review of Systems  Constitutional: Positive for chills. Negative for fever.  HENT: Positive for ear discharge, ear pain and hearing loss.   Respiratory: Negative for cough.   Gastrointestinal: Negative for nausea and vomiting.  Skin: Negative for color change and rash.  Neurological: Negative for headaches.     Physical Exam Updated Vital Signs BP (!) 157/95 (BP Location: Right Arm)   Pulse 79   Temp 99.2 F (37.3 C) (Oral)   Resp 18   Ht 5\' 9"  (1.753 m)   Wt 59 kg   LMP 04/07/2013 Comment: neg  preg 11/03/16  SpO2 99%   BMI 19.20 kg/m   Physical Exam Vitals signs and nursing note reviewed.  Constitutional:      General: She is not in acute distress.    Appearance: Normal appearance. She is well-developed and normal weight. She is not diaphoretic.  HENT:     Head: Normocephalic and atraumatic.     Ears:     Comments: Left external canal with erythema, edema and purulent drainage, TM still visible and intact.  There is no erythema or swelling of the auricle, no pre-or postauricular lymphadenopathy, no mastoid tenderness. Right ear unremarkable with clear TM, cochlear implant in place.    Nose: Nose normal.     Mouth/Throat:     Mouth: Mucous membranes are moist.     Pharynx: Oropharynx is clear.  Eyes:     General:        Right eye: No discharge.        Left eye: No  discharge.  Pulmonary:     Effort: Pulmonary effort is normal. No respiratory distress.  Skin:    General: Skin is warm and dry.  Neurological:     Mental Status: She is alert and oriented to person, place, and time.     Coordination: Coordination normal.  Psychiatric:        Mood and Affect: Mood normal.        Behavior: Behavior normal.      ED Treatments / Results  Labs (all labs ordered are listed, but only abnormal results are displayed) Labs Reviewed - No data to display  EKG None  Radiology No results found.  Procedures Procedures (including critical care time)  Medications Ordered in ED Medications  ciprofloxacin-dexamethasone (CIPRODEX) 0.3-0.1 % OTIC (EAR) suspension 4 drop (4 drops Left EAR Given 07/05/19 2206)     Initial Impression / Assessment and Plan / ED Course  I have reviewed the triage vital signs and the nursing notes.  Pertinent labs & imaging results that were available during my care of the patient were reviewed by me and considered in my medical decision making (see chart for details).  Exam consistent with otitis externa.  Canal edema not significant enough to require ear wick.  No evidence of otitis media, exam is not consistent with malignant otitis externa or mastoiditis, patient is afebrile with normal vitals.  Patient provided with Ciprodex drops here in the ED and instructed on use.  Encourage patient to follow-up with PCP in the next 2 to 3 days for recheck to ensure symptoms are improving.  Strict return precautions discussed.  Patient expresses understanding and agreement with plan.  Discharged home in good condition.  Final Clinical Impressions(s) / ED Diagnoses   Final diagnoses:  Acute otitis externa of left ear, unspecified type    ED Discharge Orders    None       Jacqlyn Larsen, [place] 07/05/19 2349    Sherwood Gambler, MD 07/06/19 1601

## 2019-07-05 NOTE — ED Triage Notes (Signed)
Pt arrives POV for eval of L ear pain/drainage x 4 days. Marked amt of yellow drainage from L ear. Denies known injury, states deaf in L ear at baseline.

## 2019-07-23 ENCOUNTER — Other Ambulatory Visit: Payer: Self-pay

## 2019-07-23 ENCOUNTER — Ambulatory Visit (INDEPENDENT_AMBULATORY_CARE_PROVIDER_SITE_OTHER): Payer: Medicare Other | Admitting: Family Medicine

## 2019-07-23 ENCOUNTER — Encounter: Payer: Self-pay | Admitting: Family Medicine

## 2019-07-23 VITALS — BP 137/99 | HR 74 | Temp 98.7°F | Resp 14 | Ht 69.0 in | Wt 118.0 lb

## 2019-07-23 DIAGNOSIS — Z131 Encounter for screening for diabetes mellitus: Secondary | ICD-10-CM

## 2019-07-23 DIAGNOSIS — I10 Essential (primary) hypertension: Secondary | ICD-10-CM

## 2019-07-23 DIAGNOSIS — R8290 Unspecified abnormal findings in urine: Secondary | ICD-10-CM

## 2019-07-23 DIAGNOSIS — R7309 Other abnormal glucose: Secondary | ICD-10-CM | POA: Diagnosis not present

## 2019-07-23 DIAGNOSIS — D5720 Sickle-cell/Hb-C disease without crisis: Secondary | ICD-10-CM

## 2019-07-23 DIAGNOSIS — H60501 Unspecified acute noninfective otitis externa, right ear: Secondary | ICD-10-CM

## 2019-07-23 DIAGNOSIS — E559 Vitamin D deficiency, unspecified: Secondary | ICD-10-CM

## 2019-07-23 DIAGNOSIS — Z9621 Cochlear implant status: Secondary | ICD-10-CM

## 2019-07-23 LAB — POCT URINALYSIS DIPSTICK
Bilirubin, UA: NEGATIVE
Glucose, UA: NEGATIVE
Ketones, UA: NEGATIVE
Nitrite, UA: NEGATIVE
Protein, UA: POSITIVE — AB
Spec Grav, UA: 1.015 (ref 1.010–1.025)
Urobilinogen, UA: 1 E.U./dL
pH, UA: 6 (ref 5.0–8.0)

## 2019-07-23 LAB — POCT GLYCOSYLATED HEMOGLOBIN (HGB A1C): Hemoglobin A1C: 4.4 % (ref 4.0–5.6)

## 2019-07-23 MED ORDER — CIPROFLOXACIN HCL 500 MG PO TABS: 500.0000 mg | ORAL_TABLET | Freq: Two times a day (BID) | ORAL | 0 refills | Status: DC

## 2019-07-23 MED FILL — CIPROFLOXACIN HCL 500 MG TA: 500 | 10 days supply | Qty: 20 | Fill #0

## 2019-07-23 NOTE — Progress Notes (Signed)
Patient [name]  New Patient Encounter Provider: Lanae Boast, [place]    QQV:[id number]  [id number]:[id number]  DOB - 28-May-1971  SUBJECTIVE:   Brittany Shannon, is a 48 y.o. female who presents to establish care with this clinic.   Current problems/concerns:  Patient was seen in the ED on 07/05/2019 due to left ear pain. Diagnosed with otitis externa and given Ciprodex drops in the ED. 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 She has a history of sickle cell anemia and is seen at [facility]. [facility]. She rarely has hospitalizations for her SCD.  History of HTN and has been out of her medication x 2 months. Having difficulty with her insurance and needs to [facility]. Chart review reveals a history of schizophrenia.   No Known Allergies Past Medical History:  Diagnosis Date  . Asthma   . 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 Homelessness   . Hypertension   . Sickle cell anemia (HCC)    Current Outpatient Medications on File Prior to Visit  Medication Sig Dispense Refill  . acetaminophen (TYLENOL) 500 MG tablet Take 500 mg by mouth every 4 (four) hours as needed for mild pain.     No current facility-administered medications on file prior to visit.    Family History  Problem Relation Age of Onset  . Sickle cell trait Other    Social History   Socioeconomic History  . Marital status: Single    Spouse name: Not on file  . Number of children: Not on file  . Years of education: Not on file  . Highest education level: Not on file  Occupational History  . Not on file  Social Needs  . Financial resource strain: Not on file  . Food insecurity    Worry: Not on file    Inability: Not on file  . Transportation needs    Medical: Not on file    Non-medical: Not on file  Tobacco Use  . Smoking status: Current Every Day Smoker    Types: Cigarettes  . Smokeless tobacco: Never Used   Substance and Sexual Activity  . Alcohol use: No  . Drug use: No  . Sexual activity: Not on file  Lifestyle  . Physical activity    Days per week: Not on file    Minutes per session: Not on file  . Stress: Not on file  Relationships  . Social Herbalist on phone: Not on file    Gets together: Not on file    Attends religious service: Not on file    Active member of club or organization: Not on file    Attends meetings of clubs or organizations: Not on file    Relationship status: Not on file  . Intimate partner violence    Fear of current or ex partner: Not on file    Emotionally abused: Not on file    Physically abused: Not on file    Forced sexual activity: Not on file  Other Topics Concern  . Not on file  Social History Narrative  . Not on file    Review of Systems  Constitutional: Negative.   HENT: Positive for ear discharge, ear pain and hearing loss.   Eyes: Negative.   Respiratory: Negative.   Cardiovascular: Negative.   Gastrointestinal: Negative.   Genitourinary: Negative.   Musculoskeletal: Negative.   Skin: Negative.  Neurological: Negative.   Psychiatric/Behavioral: Negative.      OBJECTIVE:    BP (!) 137/99 (BP Location: Left Arm, Patient Position: Sitting, Cuff Size: Normal)   Pulse 74   Temp 98.7 F (37.1 C) (Oral)   Resp 14   Ht 5\' 9"  (1.753 m)   Wt 118 lb (53.5 kg)   LMP 04/07/2013 Comment: neg preg 11/03/16  SpO2 98%   BMI 17.43 kg/m   Physical Exam  Constitutional: She is oriented to person, place, and time and well-developed, well-nourished, and in no distress. No distress.  HENT:  Head: Normocephalic and atraumatic.  Right Ear: Decreased hearing is noted.  Left Ear: There is drainage, swelling and tenderness. Decreased hearing is noted.  Nose: Nose normal.  Mouth/Throat: Oropharynx is clear and moist and mucous membranes are normal. Abnormal dentition. Dental caries present.  Unable to visualize left TM due to edema and  exudate. copius amounts of white pus noted.   Eyes: Pupils are equal, round, and reactive to light. Conjunctivae and EOM are normal.  Neck: Normal range of motion. Neck supple.  Cardiovascular: Normal rate, regular rhythm and intact distal pulses. Exam reveals no gallop and no friction rub.  No murmur heard. Pulmonary/Chest: Effort normal and breath sounds normal. No respiratory distress. She has no wheezes.  Abdominal: Soft. Bowel sounds are normal. There is no abdominal tenderness.  Musculoskeletal: Normal range of motion.        General: No tenderness or edema.  Lymphadenopathy:    She has no cervical adenopathy.  Neurological: She is alert and oriented to person, place, and time. Gait normal.  Skin: Skin is warm and dry.  Psychiatric: Mood, memory, affect and judgment normal.  Nursing note and vitals reviewed.    ASSESSMENT/PLAN:  1. Essential hypertension - Urinalysis Dipstick - Fructosamine  2. Screening for diabetes mellitus - Fructosamine  3. 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 Vitamin D deficiency disease - VITAMIN D 25 Hydroxy (Vit-D Deficiency, Fractures)  6. Cochlear implant in place - Ambulatory referral to ENT  7. Abnormal urinalysis - Urine Culture  8. Acute otitis externa of right ear, unspecified type - ciprofloxacin (CIPRO) 500 MG tablet; Take 1 tablet (500 mg total) by mouth 2 (two) times daily.  Dispense: 20 tablet; Refill: 0     Return in about 2 weeks (around 08/06/2019) for Ear infection.  The patient was given clear instructions to go to ER or return to medical center if symptoms don't improve, worsen or new problems develop. The patient verbalized understanding. The patient was told to call to get lab results  if they haven't heard anything in the next week.     This note has been created with Surveyor, quantity. Any transcriptional errors are unintentional.   [name], FNP-BC Patient [facility] [address] [place], [place] [address] [phone]

## 2019-07-23 NOTE — Patient Instructions (Signed)
Hypertension, Adult Hypertension is another name for high blood pressure. High blood pressure forces your heart to work harder to pump blood. This can cause problems over time. There are two numbers in a blood pressure reading. There is a top number (systolic) over a bottom number (diastolic). It is best to have a blood pressure that is below 120/80. Healthy choices can help lower your blood pressure, or you may need medicine to help lower it. What are the causes? The cause of this condition is not known. Some conditions may be related to high blood pressure. What increases the risk?  Smoking.  Having type 2 diabetes mellitus, high cholesterol, or both.  Not getting enough exercise or physical activity.  Being overweight.  Having too much fat, sugar, calories, or salt (sodium) in your diet.  Drinking too much alcohol.  Having long-term (chronic) kidney disease.  Having a family history of high blood pressure.  Age. Risk increases with age.  Race. You may be at higher risk if you are African American.  Gender. Men are at higher risk than women before age 45. After age 65, women are at higher risk than men.  Having obstructive sleep apnea.  Stress. What are the signs or symptoms?  High blood pressure may not cause symptoms. Very high blood pressure (hypertensive crisis) may cause: ? Headache. ? Feelings of worry or nervousness (anxiety). ? Shortness of breath. ? Nosebleed. ? A feeling of being sick to your stomach (nausea). ? Throwing up (vomiting). ? Changes in how you see. ? Very bad chest pain. ? Seizures. How is this treated?  This condition is treated by making healthy lifestyle changes, such as: ? Eating healthy foods. ? Exercising more. ? Drinking less alcohol.  Your health care provider may prescribe medicine if lifestyle changes are not enough to get your blood pressure under control, and if: ? Your top number is above 130. ? Your bottom number is above 80.   Your personal target blood pressure may vary. Follow these instructions at home: Eating and drinking   If told, follow the DASH eating plan. To follow this plan: ? Fill one half of your plate at each meal with fruits and vegetables. ? Fill one fourth of your plate at each meal with whole grains. Whole grains include whole-wheat pasta, brown rice, and whole-grain bread. ? Eat or drink low-fat dairy products, such as skim milk or low-fat yogurt. ? Fill one fourth of your plate at each meal with low-fat (lean) proteins. Low-fat proteins include fish, chicken without skin, eggs, beans, and tofu. ? Avoid fatty meat, cured and processed meat, or chicken with skin. ? Avoid pre-made or processed food.  Eat less than 1,500 mg of salt each day.  Do not drink alcohol if: ? Your doctor tells you not to drink. ? You are pregnant, may be pregnant, or are planning to become pregnant.  If you drink alcohol: ? Limit how much you use to:  0-1 drink a day for women.  0-2 drinks a day for men. ? Be aware of how much alcohol is in your drink. In the [place] one drink equals one 12 oz bottle of beer (355 mL), one 5 oz glass of wine (148 mL), or one 1 oz glass of hard liquor (44 mL). Lifestyle   Work with your doctor to stay at a healthy weight or to lose weight. Ask your doctor what the best weight is for you.  Get at least 30 minutes of exercise most   days of the week. This may include walking, swimming, or biking.  Get at least 30 minutes of exercise that strengthens your muscles (resistance exercise) at least 3 days a week. This may include lifting weights or doing Pilates.  Do not use any products that contain nicotine or tobacco, such as cigarettes, e-cigarettes, and chewing tobacco. If you need help quitting, ask your doctor.  Check your blood pressure at home as told by your doctor.  Keep all follow-up visits as told by your doctor. This is important. Medicines  Take over-the-counter and  prescription medicines only as told by your doctor. Follow directions carefully.  Do not skip doses of blood pressure medicine. The medicine does not work as well if you skip doses. Skipping doses also puts you at risk for problems.  Ask your doctor about side effects or reactions to medicines that you should watch for. Contact a doctor if you:  Think you are having a reaction to the medicine you are taking.  Have headaches that keep coming back (recurring).  Feel dizzy.  Have swelling in your ankles.  Have trouble with your vision. Get help right away if you:  Get a very bad headache.  Start to feel mixed up (confused).  Feel weak or numb.  Feel faint.  Have very bad pain in your: ? Chest. ? Belly (abdomen).  Throw up more than once.  Have trouble breathing. Summary  Hypertension is another name for high blood pressure.  High blood pressure forces your heart to work harder to pump blood.  For most people, a normal blood pressure is less than 120/80.  Making healthy choices can help lower blood pressure. If your blood pressure does not get lower with healthy choices, you may need to take medicine. This information is not intended to replace advice given to you by your health care provider. Make sure you discuss any questions you have with your health care provider. Document Released: 05/15/2008 Document Revised: 08/07/2018 Document Reviewed: 08/07/2018 Elsevier Patient Education  [place]. Sickle Cell Anemia, Adult  Sickle cell anemia is a condition where your red blood cells are shaped like sickles. Red blood cells carry oxygen through the body. Sickle-shaped cells do not live as long as normal red blood cells. They also clump together and block blood from flowing through the blood vessels. This prevents the body from getting enough oxygen. Sickle cell anemia causes organ damage and pain. It also increases the risk of infection. Follow these instructions at  home: Medicines  Take over-the-counter and prescription medicines only as told by your doctor.  If you were prescribed an antibiotic medicine, take it as told by your doctor. Do not stop taking the antibiotic even if you start to feel better.  If you develop a fever, do not take medicines to lower the fever right away. Tell your doctor about the fever. Managing pain, stiffness, and swelling  Try these methods to help with pain: ? Use a heating pad. ? Take a warm bath. ? Distract yourself, such as by watching TV. Eating and drinking  Drink enough fluid to keep your pee (urine) clear or pale yellow. Drink more in hot weather and during exercise.  Limit or avoid alcohol.  Eat a healthy diet. Eat plenty of fruits, vegetables, whole grains, and lean protein.  Take vitamins and supplements as told by your doctor. Traveling  When traveling, keep these with you: ? Your medical information. ? The names of your doctors. ? Your medicines.  If you need to take an airplane, talk to your doctor first. Activity  Rest often.  Avoid exercises that make your heart beat much faster, such as jogging. General instructions  Do not use products that have nicotine or tobacco, such as cigarettes and e-cigarettes. If you need help quitting, ask your doctor.  Consider wearing a medical alert bracelet.  Avoid being in high places (high altitudes), such as mountains.  Avoid very hot or cold temperatures.  Avoid places where the temperature changes a lot.  Keep all follow-up visits as told by your doctor. This is important. Contact a doctor if:  A joint hurts.  Your feet or hands hurt or swell.  You feel tired (fatigued). Get help right away if:  You have symptoms of infection. These include: ? Fever. ? Chills. ? Being very tired. ? Irritability. ? Poor eating. ? Throwing up (vomiting).  You feel dizzy or faint.  You have new stomach pain, especially on the left side.  You  have a an erection (priapism) that lasts more than 4 hours.  You have numbness in your arms or legs.  You have a hard time moving your arms or legs.  You have trouble talking.  You have pain that does not go away when you take medicine.  You are short of breath.  You are breathing fast.  You have a long-term cough.  You have pain in your chest.  You have a bad headache.  You have a stiff neck.  Your stomach looks bloated even though you did not eat much.  Your skin is pale.  You suddenly cannot see well. Summary  Sickle cell anemia is a condition where your red blood cells are shaped like sickles.  Follow your doctor's advice on ways to manage pain, food to eat, activities to do, and steps to take for safe travel.  Get medical help right away if you have any signs of infection, such as a fever. This information is not intended to replace advice given to you by your health care provider. Make sure you discuss any questions you have with your health care provider. Document Released: 09/17/2013 Document Revised: 03/21/2019 Document Reviewed: 01/02/2017 Elsevier Patient Education  2020 [employer].

## 2019-07-24 ENCOUNTER — Telehealth: Payer: Self-pay

## 2019-07-24 NOTE — Telephone Encounter (Signed)
Called, no answer and no voicemail. Will try later.

## 2019-07-25 ENCOUNTER — Telehealth: Payer: Self-pay

## 2019-07-25 ENCOUNTER — Other Ambulatory Visit: Payer: Self-pay | Admitting: Family Medicine

## 2019-07-25 DIAGNOSIS — E559 Vitamin D deficiency, unspecified: Secondary | ICD-10-CM

## 2019-07-25 LAB — COMPREHENSIVE METABOLIC PANEL
ALT: 9 IU/L (ref 0–32)
AST: 15 IU/L (ref 0–40)
Albumin/Globulin Ratio: 1.2 (ref 1.2–2.2)
Albumin: 4.2 g/dL (ref 3.8–4.8)
Alkaline Phosphatase: 68 IU/L (ref 39–117)
BUN/Creatinine Ratio: 9 (ref 9–23)
BUN: 7 mg/dL (ref 6–24)
Bilirubin Total: 1.4 mg/dL — ABNORMAL HIGH (ref 0.0–1.2)
CO2: 26 mmol/L (ref 20–29)
Calcium: 9.5 mg/dL (ref 8.7–10.2)
Chloride: 97 mmol/L (ref 96–106)
Creatinine, Ser: 0.78 mg/dL (ref 0.57–1.00)
GFR calc Af Amer: 103 mL/min/{1.73_m2} (ref >59)
GFR calc non Af Amer: 90 mL/min/{1.73_m2} (ref >59)
Globulin, Total: 3.6 g/dL (ref 1.5–4.5)
Glucose: 81 mg/dL (ref 65–99)
Potassium: 3.4 mmol/L — ABNORMAL LOW (ref 3.5–5.2)
Sodium: 136 mmol/L (ref 134–144)
Total Protein: 7.8 g/dL (ref 6.0–8.5)

## 2019-07-25 LAB — HEMOGLOBINOPATHY EVALUATION
Ferritin: 336 ng/mL — ABNORMAL HIGH (ref 15–150)
Hematocrit: 27.9 % — ABNORMAL LOW (ref 34.0–46.6)
Hemoglobin: 9.3 g/dL — ABNORMAL LOW (ref 11.1–15.9)
Hgb A2 Quant: 4.2 % — ABNORMAL HIGH (ref 1.8–3.2)
Hgb A: 0 % — ABNORMAL LOW (ref 96.4–98.8)
Hgb C: 45.8 % — ABNORMAL HIGH
Hgb F Quant: 0 % (ref 0.0–2.0)
Hgb S: 50 % — ABNORMAL HIGH
Hgb Solubility: POSITIVE — AB
Hgb Variant: 0 %
MCH: 30.1 pg (ref 26.6–33.0)
MCHC: 33.3 g/dL (ref 31.5–35.7)
MCV: 90 fL (ref 79–97)
Platelets: 478 10*3/uL — ABNORMAL HIGH (ref 150–450)
RBC: 3.09 x10E6/uL — ABNORMAL LOW (ref 3.77–5.28)
RDW: 17.3 % — ABNORMAL HIGH (ref 11.7–15.4)
WBC: 7.1 10*3/uL (ref 3.4–10.8)

## 2019-07-25 LAB — URINE CULTURE

## 2019-07-25 LAB — CBC WITH DIFFERENTIAL
Basophils Absolute: 0.1 10*3/uL (ref 0.0–0.2)
Basos: 1 %
EOS (ABSOLUTE): 0.1 10*3/uL (ref 0.0–0.4)
Eos: 1 %
Immature Grans (Abs): 0 10*3/uL (ref 0.0–0.1)
Immature Granulocytes: 1 %
Lymphocytes Absolute: 2.6 10*3/uL (ref 0.7–3.1)
Lymphs: 36 %
Monocytes Absolute: 0.6 10*3/uL (ref 0.1–0.9)
Monocytes: 8 %
Neutrophils Absolute: 3.7 10*3/uL (ref 1.4–7.0)
Neutrophils: 53 %

## 2019-07-25 LAB — RETICULOCYTES: Retic Ct Pct: 3.3 % — ABNORMAL HIGH (ref 0.6–2.6)

## 2019-07-25 LAB — FRUCTOSAMINE: Fructosamine: 251 umol/L (ref 0–285)

## 2019-07-25 LAB — VITAMIN D 25 HYDROXY (VIT D DEFICIENCY, FRACTURES): Vit D, 25-Hydroxy: 17.7 ng/mL — ABNORMAL LOW (ref 30.0–100.0)

## 2019-07-25 MED ORDER — VITAMIN D (ERGOCALCIFEROL) 1.25 MG (50000 UNIT) PO CAPS: 50000.0000 [IU] | ORAL_CAPSULE | ORAL | 0 refills | Status: AC

## 2019-07-25 NOTE — Progress Notes (Signed)
Low Vitamin D.

## 2019-07-25 NOTE — Telephone Encounter (Signed)
-----   Message from Lanae Boast, [place] sent at 07/25/2019  3:54 PM EDT ----- Your vitamin D is low. I will send a prescription for weekly vitamin D to the pharmacy. Her potassium is on the lower end of normal. Please consume potassium rich foods. We will continue to monitor your vitamin D and potassium levels. Potassium Rich Foods Include: Bananas, oranges, cantaloupe, honeydew, apricots, grapefruit (some dried fruits, such as prunes, raisins, and dates, are also high in potassium) Cooked spinach Cooked broccoli Potatoes Sweet potatoes Mushrooms Peas Cucumbers Zucchini Eggplant Pumpkins

## 2019-07-25 NOTE — Telephone Encounter (Signed)
Called, no answer and no voicemail. Will try later.

## 2019-07-28 MED FILL — VIT D2 1.25 MG (50,000 UNIT: 1.25 MG | 28 days supply | Qty: 4 | Fill #0

## 2019-07-30 NOTE — Telephone Encounter (Signed)
Called, no answer and no voicemail.  

## 2019-07-31 NOTE — Telephone Encounter (Signed)
Unable to contact patient via phone. Will mail letter.

## 2019-08-06 ENCOUNTER — Ambulatory Visit: Payer: Medicare Other | Admitting: Family Medicine

## 2019-08-20 ENCOUNTER — Encounter (HOSPITAL_COMMUNITY): Payer: Self-pay | Admitting: *Deleted

## 2019-08-20 ENCOUNTER — Encounter (HOSPITAL_COMMUNITY): Payer: Self-pay

## 2019-12-23 ENCOUNTER — Emergency Department (HOSPITAL_COMMUNITY)
Admission: EM | Admit: 2019-12-23 | Discharge: 2019-12-24 | Disposition: A | Discharge status: Home / Self Care | Payer: Medicare Other | Attending: Emergency Medicine | Admitting: Emergency Medicine

## 2019-12-23 ENCOUNTER — Encounter (HOSPITAL_COMMUNITY): Payer: Self-pay | Admitting: Emergency Medicine

## 2019-12-23 ENCOUNTER — Other Ambulatory Visit: Payer: Self-pay

## 2019-12-23 DIAGNOSIS — Z590 Homelessness: Secondary | ICD-10-CM | POA: Insufficient documentation

## 2019-12-23 DIAGNOSIS — F17210 Nicotine dependence, cigarettes, uncomplicated: Secondary | ICD-10-CM | POA: Insufficient documentation

## 2019-12-23 DIAGNOSIS — R449 Unspecified symptoms and signs involving general sensations and perceptions: Secondary | ICD-10-CM | POA: Insufficient documentation

## 2019-12-23 DIAGNOSIS — F209 Schizophrenia, unspecified: Secondary | ICD-10-CM | POA: Diagnosis not present

## 2019-12-23 DIAGNOSIS — Z20822 Contact with and (suspected) exposure to covid-19: Secondary | ICD-10-CM | POA: Insufficient documentation

## 2019-12-23 DIAGNOSIS — F1220 Cannabis dependence, uncomplicated: Secondary | ICD-10-CM | POA: Insufficient documentation

## 2019-12-23 DIAGNOSIS — R443 Hallucinations, unspecified: Secondary | ICD-10-CM | POA: Diagnosis present

## 2019-12-23 DIAGNOSIS — I10 Essential (primary) hypertension: Secondary | ICD-10-CM | POA: Insufficient documentation

## 2019-12-23 LAB — COMPREHENSIVE METABOLIC PANEL
ALT: 27 U/L (ref 0–44)
AST: 31 U/L (ref 15–41)
Albumin: 4.2 g/dL (ref 3.5–5.0)
Alkaline Phosphatase: 69 U/L (ref 38–126)
Anion gap: 8 (ref 5–15)
BUN: 6 mg/dL (ref 6–20)
CO2: 28 mmol/L (ref 22–32)
Calcium: 9.6 mg/dL (ref 8.9–10.3)
Chloride: 103 mmol/L (ref 98–111)
Creatinine, Ser: 0.87 mg/dL (ref 0.44–1.00)
GFR calc Af Amer: >60 mL/min (ref >60)
GFR calc non Af Amer: >60 mL/min (ref >60)
Glucose, Bld: 97 mg/dL (ref 70–99)
Potassium: 3 mmol/L — ABNORMAL LOW (ref 3.5–5.1)
Sodium: 139 mmol/L (ref 135–145)
Total Bilirubin: 2.6 mg/dL — ABNORMAL HIGH (ref 0.3–1.2)
Total Protein: 7.9 g/dL (ref 6.5–8.1)

## 2019-12-23 LAB — CBC
HCT: 29.2 % — ABNORMAL LOW (ref 36.0–46.0)
Hemoglobin: 11 g/dL — ABNORMAL LOW (ref 12.0–15.0)
MCH: 33.2 pg (ref 26.0–34.0)
MCHC: 37.7 g/dL — ABNORMAL HIGH (ref 30.0–36.0)
MCV: 88.2 fL (ref 80.0–100.0)
Platelets: 393 10*3/uL (ref 150–400)
RBC: 3.31 MIL/uL — ABNORMAL LOW (ref 3.87–5.11)
RDW: 14.6 % (ref 11.5–15.5)
WBC: 8.2 10*3/uL (ref 4.0–10.5)
nRBC: 0.9 % — ABNORMAL HIGH (ref 0.0–0.2)

## 2019-12-23 LAB — I-STAT BETA HCG BLOOD, ED (MC, WL, AP ONLY): I-stat hCG, quantitative: <5 m[IU]/mL (ref <5)

## 2019-12-23 LAB — RAPID URINE DRUG SCREEN, HOSP PERFORMED
Amphetamines: NOT DETECTED
Barbiturates: NOT DETECTED
Benzodiazepines: NOT DETECTED
Cocaine: NOT DETECTED
Opiates: NOT DETECTED
Tetrahydrocannabinol: POSITIVE — AB
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
## 2019-12-23 NOTE — ED Notes (Addendum)
Called pt to collect urine sample, no response 
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
## 2019-12-24 DIAGNOSIS — F209 Schizophrenia, unspecified: Secondary | ICD-10-CM | POA: Diagnosis not present

## 2019-12-24 LAB — RESPIRATORY PANEL BY RT PCR (FLU A&B, COVID)
Influenza A by PCR: NEGATIVE
Influenza B by PCR: NEGATIVE
SARS Coronavirus 2 by RT PCR: NEGATIVE
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
## 2020-01-15 ENCOUNTER — Telehealth: Payer: Self-pay | Admitting: Family Medicine

## 2020-01-15 NOTE — Telephone Encounter (Signed)
No able to reach patient.  Please ask patient if the nebulizer or nebulizer solution is needed and which pharmacy.

## 2020-01-16 ENCOUNTER — Other Ambulatory Visit: Payer: Self-pay

## 2020-01-16 MED ORDER — ALBUTEROL SULFATE HFA 108 (90 BASE) MCG/ACT IN AERS: 2.0000 | INHALATION_SPRAY | Freq: Four times a day (QID) | RESPIRATORY_TRACT | 0 refills | Status: DC | PRN

## 2020-01-16 MED FILL — ALBUTEROL SULFATE HFA 108 (: 108 (90 BAS | 25 days supply | Qty: 18 | Fill #0

## 2020-01-22 ENCOUNTER — Ambulatory Visit: Payer: Medicare Other | Admitting: Nurse Practitioner

## 2020-07-02 ENCOUNTER — Ambulatory Visit (HOSPITAL_COMMUNITY)
Admission: RE | Admit: 2020-07-02 | Discharge: 2020-07-02 | Disposition: A | Discharge status: Home / Self Care | Payer: Medicare Other | Source: Ambulatory Visit | Attending: Nurse Practitioner | Admitting: Nurse Practitioner

## 2020-07-02 ENCOUNTER — Ambulatory Visit (INDEPENDENT_AMBULATORY_CARE_PROVIDER_SITE_OTHER): Payer: Medicare Other | Admitting: Nurse Practitioner

## 2020-07-02 ENCOUNTER — Other Ambulatory Visit: Payer: Self-pay

## 2020-07-02 ENCOUNTER — Encounter: Payer: Self-pay | Admitting: Nurse Practitioner

## 2020-07-02 VITALS — BP 134/91 | HR 91 | Temp 98.6°F | Resp 14 | Ht 69.0 in | Wt 120.0 lb

## 2020-07-02 DIAGNOSIS — Z9081 Acquired absence of spleen: Secondary | ICD-10-CM

## 2020-07-02 DIAGNOSIS — E559 Vitamin D deficiency, unspecified: Secondary | ICD-10-CM

## 2020-07-02 DIAGNOSIS — D5720 Sickle-cell/Hb-C disease without crisis: Secondary | ICD-10-CM

## 2020-07-02 DIAGNOSIS — D75838 Other thrombocytosis: Secondary | ICD-10-CM

## 2020-07-02 DIAGNOSIS — K5909 Other constipation: Secondary | ICD-10-CM

## 2020-07-02 DIAGNOSIS — R1084 Generalized abdominal pain: Secondary | ICD-10-CM

## 2020-07-02 DIAGNOSIS — R140 Abdominal distension (gaseous): Secondary | ICD-10-CM

## 2020-07-02 DIAGNOSIS — H918X9 Other specified hearing loss, unspecified ear: Secondary | ICD-10-CM

## 2020-07-02 DIAGNOSIS — R935 Abnormal findings on diagnostic imaging of other abdominal regions, including retroperitoneum: Secondary | ICD-10-CM

## 2020-07-02 DIAGNOSIS — D473 Essential (hemorrhagic) thrombocythemia: Secondary | ICD-10-CM

## 2020-07-02 DIAGNOSIS — I517 Cardiomegaly: Secondary | ICD-10-CM

## 2020-07-02 LAB — COMP. METABOLIC PANEL (12)
AST: 16 IU/L (ref 0–40)
Albumin/Globulin Ratio: 1 — ABNORMAL LOW (ref 1.2–2.2)
Albumin: 3.6 g/dL — ABNORMAL LOW (ref 3.8–4.8)
Alkaline Phosphatase: 152 IU/L — ABNORMAL HIGH (ref 48–121)
BUN/Creatinine Ratio: 11 (ref 9–23)
BUN: 7 mg/dL (ref 6–24)
Bilirubin Total: 1 mg/dL (ref 0.0–1.2)
Calcium: 9.5 mg/dL (ref 8.7–10.2)
Chloride: 101 mmol/L (ref 96–106)
Creatinine, Ser: 0.64 mg/dL (ref 0.57–1.00)
GFR calc Af Amer: 120 mL/min/{1.73_m2} (ref >59)
GFR calc non Af Amer: 104 mL/min/{1.73_m2} (ref >59)
Globulin, Total: 3.5 g/dL (ref 1.5–4.5)
Glucose: 97 mg/dL (ref 65–99)
Potassium: 4 mmol/L (ref 3.5–5.2)
Sodium: 140 mmol/L (ref 134–144)
Total Protein: 7.1 g/dL (ref 6.0–8.5)

## 2020-07-02 LAB — CBC WITH DIFFERENTIAL/PLATELET
Basophils Absolute: 0.1 10*3/uL (ref 0.0–0.2)
Basos: 1 %
EOS (ABSOLUTE): 0.2 10*3/uL (ref 0.0–0.4)
Eos: 1 %
Hematocrit: 27.4 % — ABNORMAL LOW (ref 34.0–46.6)
Hemoglobin: 9 g/dL — ABNORMAL LOW (ref 11.1–15.9)
Lymphocytes Absolute: 2.6 10*3/uL (ref 0.7–3.1)
Lymphs: 20 %
MCH: 28.9 pg (ref 26.6–33.0)
MCHC: 32.8 g/dL (ref 31.5–35.7)
MCV: 88 fL (ref 79–97)
Monocytes Absolute: 0.9 10*3/uL (ref 0.1–0.9)
Monocytes: 7 %
Neutrophils Absolute: 9.2 10*3/uL — ABNORMAL HIGH (ref 1.4–7.0)
Neutrophils: 71 %
Platelets: 905 10*3/uL (ref 150–450)
RBC: 3.11 x10E6/uL — ABNORMAL LOW (ref 3.77–5.28)
RDW: 22.8 % — ABNORMAL HIGH (ref 11.7–15.4)
WBC: 12.9 10*3/uL — ABNORMAL HIGH (ref 3.4–10.8)

## 2020-07-02 LAB — AMYLASE: Amylase: 76 U/L (ref 31–110)

## 2020-07-02 LAB — LIPASE: Lipase: 28 U/L (ref 14–72)

## 2020-07-02 MED ORDER — ALBUTEROL SULFATE HFA 108 (90 BASE) MCG/ACT IN AERS: 2.0000 | INHALATION_SPRAY | Freq: Four times a day (QID) | RESPIRATORY_TRACT | 0 refills | Status: DC | PRN

## 2020-07-02 NOTE — Progress Notes (Signed)
[place], [place]  [address] Phone:  [phone]   Fax:  [phone]   Acute Office Visit  Subjective:    Patient ID: Brittany Shannon, female    DOB: 1971/06/28, 49 y.o.   MRN: [id number]  Chief Complaint  Patient presents with   Bloated   Gas   Constipation    HPI Patient is in today for bloating. She  has a past medical history of Asthma, Deafness in left ear, Homelessness, Hypertension, and Sickle cell anemia ([place]).   Abdominal Pain Patient complains of abdominal pain. The pain is described as aching, and is 8/10 in intensity. The patient is experiencing generalized pain without radiation. Onset was several weeks ago. Symptoms have been gradually worsening. Aggravating factors: bowel movement.  Alleviating factors: belching and probiotics. Associated symptoms: constipation and weight loss. The patient denies anorexia, arthralagias, chills, diarrhea, dysuria, fever, frequency, headache, hematochezia, hematuria, melena, nausea and vomiting.   Past Medical History:  Diagnosis Date   Asthma    Deafness in left ear    Homelessness    Hypertension    Sickle cell anemia (HCC)     Past Surgical History:  Procedure Laterality Date   CHOLECYSTECTOMY     EYE SURGERY     SPLENECTOMY, TOTAL      Family History  Problem Relation Age of Onset   Sickle cell trait Other     Social History   Socioeconomic History   Marital status: Single    Spouse name: Not on file   Number of children: Not on file   Years of education: Not on file   Highest education level: Not on file  Occupational History   Not on file  Tobacco Use   Smoking status: Current Every Day Smoker    Types: Cigarettes   Smokeless tobacco: Never Used  Substance and Sexual Activity   Alcohol use: No   Drug use: No   Sexual activity: Yes  Other Topics Concern   Not on file  Social History Narrative   Not on file   Social Determinants  of Health   Financial Resource Strain:    Difficulty of Paying Living Expenses:   Food Insecurity:    Worried About Charity fundraiser in the Last Year:    Arboriculturist in the Last Year:   Transportation Needs:    Film/video editor (Medical):    Lack of Transportation (Non-Medical):   Physical Activity:    Days of Exercise per Week:    Minutes of Exercise per Session:   Stress:    Feeling of Stress :   Social Connections:    Frequency of Communication with Friends and Family:    Frequency of Social Gatherings with Friends and Family:    Attends Religious Services:    Active Member of Clubs or Organizations:    Attends Music therapist:    Marital Status:   Intimate Partner Violence:    Fear of Current or Ex-Partner:    Emotionally Abused:    Physically Abused:    Sexually Abused:     Outpatient Medications Prior to Visit  Medication Sig Dispense Refill   albuterol (VENTOLIN HFA) 108 (90 Base) MCG/ACT inhaler Inhale 2 puffs into the lungs every 6 (six) hours as needed for wheezing. No refills. Patient needs an appointment. (Patient not taking: Reported on 07/02/2020) 8 g 0   No facility-administered medications prior to visit.  No Known Allergies  Review of Systems  All other systems reviewed and are negative.      Objective:    Physical Exam Vitals reviewed.  Constitutional:      Appearance: She is not ill-appearing or toxic-appearing.  HENT:     Head: Normocephalic and atraumatic.     Ears:     Comments: Hearing loss     Nose: Nose normal.     Mouth/Throat:     Mouth: Mucous membranes are moist.  Cardiovascular:     Rate and Rhythm: Normal rate and regular rhythm.     Pulses: Normal pulses.     Heart sounds: Normal heart sounds.  Pulmonary:     Effort: Pulmonary effort is normal.     Breath sounds: Normal breath sounds.  Abdominal:     General: Bowel sounds are normal. There is distension.     Tenderness:  There is abdominal tenderness. There is guarding.     Comments: Murphy sign positive   Musculoskeletal:        General: Normal range of motion.     Cervical back: Normal range of motion.  Skin:    General: Skin is warm and dry.  Neurological:     Mental Status: She is alert and oriented to person, place, and time.  Psychiatric:        Mood and Affect: Mood normal.        Behavior: Behavior normal.        Thought Content: Thought content normal.        Judgment: Judgment normal.     BP (!) 134/91 (BP Location: Left Arm, Patient Position: Sitting, Cuff Size: Normal)    Pulse 91    Temp 98.6 F (37 C) (Oral)    Resp 14    Ht 5\' 9"  (1.753 m)    Wt 120 lb (54.4 kg)    LMP 04/07/2013 Comment: neg preg 11/03/16   SpO2 98%    BMI 17.72 kg/m  Wt Readings from Last 3 Encounters:  07/02/20 120 lb (54.4 kg)  12/23/19 125 lb (56.7 kg)  07/23/19 118 lb (53.5 kg)    Health Maintenance Due  Topic Date Due   Hepatitis C Screening  Never done   COVID-19 Vaccine (1) Never done   PAP SMEAR-Modifier  Never done   MAMMOGRAM  Never done   COLONOSCOPY  Never done    There are no preventive care reminders to display for this patient.   No results found for: TSH Lab Results  Component Value Date   WBC 12.9 (H) 07/02/2020   HGB 9.0 (L) 07/02/2020   HCT 27.4 (L) 07/02/2020   MCV 88 07/02/2020   PLT 905 (HH) 07/02/2020   Lab Results  Component Value Date   NA 140 07/02/2020   K 4.0 07/02/2020   CO2 28 12/23/2019   GLUCOSE 97 07/02/2020   BUN 7 07/02/2020   CREATININE 0.64 07/02/2020   BILITOT 1.0 07/02/2020   ALKPHOS 152 (H) 07/02/2020   AST 16 07/02/2020   ALT 27 12/23/2019   PROT 7.1 07/02/2020   ALBUMIN 3.6 (L) 07/02/2020   CALCIUM 9.5 07/02/2020   ANIONGAP 8 12/23/2019   Lab Results  Component Value Date   CHOL 119 04/29/2019   Lab Results  Component Value Date   HDL 37 (L) 04/29/2019   Lab Results  Component Value Date   LDLCALC 70 04/29/2019   Lab Results    Component Value Date   TRIG 62  04/29/2019   Lab Results  Component Value Date   CHOLHDL 3.2 04/29/2019   Lab Results  Component Value Date   HGBA1C 4.4 07/23/2019       Assessment & Plan:   Problem List Items Addressed This Visit      Hematopoietic and Hemostatic   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 Metabolic Panel (12) (Completed)   Hepatitis C antibody   DG Abd 2 Views (Completed)   1.Cardiomegaly 2.Nonobstructed gas pattern with radiopaque material in the colon 3. Rounded opacity in the mid to epigastric region of the abdomen with convex margin could relate to opacified distended stomach though mass is also possible. Correlation with CT may be considered.  No treatment at this time. Encouraged patient to go to the ER for further evaluation secondary to her other underlying conditions. Patient agrees to go to the ER in the next 10-12 hours; not wanting to sit around in the ER waiting   US Abdomen Complete    Hemoglobin S-C disease (HCC)   Relevant Orders   Iron, TIBC and Ferritin Panel pending   DG Abd 2 Views (Completed)   US Abdomen Complete      Other Visit Diagnoses    Bloating       Relevant Orders   DG Abd 2 Views (Completed)   Vitamin D deficiency disease       Relevant Orders   VITAMIN D 25 Hydroxy (Vit-D Deficiency, Fractures)   Other constipation       Relevant Orders   DG Abd 2 Views (Completed)   US Abdomen Complete   Other specified forms of hearing loss, unspecified laterality           No orders of the defined types were placed in this encounter.    Vevelyn Francois, NP

## 2020-07-02 NOTE — Patient Instructions (Signed)
Abdominal Bloating When you have abdominal bloating, your abdomen may feel full, tight, or painful. It may also look bigger than normal or swollen (distended). Common causes of abdominal bloating include:  Swallowing air.  Constipation.  Problems digesting food.  Eating too much.  Irritable bowel syndrome. This is a condition that affects the large intestine.  Lactose intolerance. This is an inability to digest lactose, a natural sugar in dairy products.  Celiac disease. This is a condition that affects the ability to digest gluten, a protein found in some grains.  Gastroparesis. This is a condition that slows down the movement of food in the stomach and small intestine. It is more common in people with diabetes mellitus.  Gastroesophageal reflux disease (GERD). This is a digestive condition that makes stomach acid flow back into the esophagus.  Urinary retention. This means that the body is holding onto urine, and the bladder cannot be emptied all the way. Follow these instructions at home: Eating and drinking  Avoid eating too much.  Try not to swallow air while talking or eating.  Avoid eating while lying down.  Avoid these foods and drinks: ? Foods that cause gas, such as broccoli, cabbage, cauliflower, and baked beans. ? Carbonated drinks. ? Hard candy. ? Chewing gum. Medicines  Take over-the-counter and prescription medicines only as told by your health care provider.  Take probiotic medicines. These medicines contain live bacteria or yeasts that can help digestion.  Take coated peppermint oil capsules. Activity  Try to exercise regularly. Exercise may help to relieve bloating that is caused by gas and relieve constipation. General instructions  Keep all follow-up visits as told by your health care provider. This is important. Contact a health care provider if:  You have nausea and vomiting.  You have diarrhea.  You have abdominal pain.  You have unusual  weight loss or weight gain.  You have severe pain, and medicines do not help. Get help right away if:  You have severe chest pain.  You have trouble breathing.  You have shortness of breath.  You have trouble urinating.  You have darker urine than normal.  You have blood in your stools or have dark, tarry stools. Summary  Abdominal bloating means that the abdomen is swollen.  Common causes of abdominal bloating are swallowing air, constipation, and problems digesting food.  Avoid eating too much and avoid swallowing air.  Avoid foods that cause gas, carbonated drinks, hard candy, and chewing gum. This information is not intended to replace advice given to you by your health care provider. Make sure you discuss any questions you have with your health care provider. Document Revised: 03/17/2019 Document Reviewed: 12/29/2016 Elsevier Patient Education  2020 [employer]  

## 2020-07-03 ENCOUNTER — Other Ambulatory Visit: Payer: Self-pay

## 2020-07-03 ENCOUNTER — Emergency Department (HOSPITAL_COMMUNITY)
Admission: EM | Admit: 2020-07-03 | Discharge: 2020-07-03 | Disposition: A | Discharge status: Home / Self Care | Payer: Medicare Other | Attending: Emergency Medicine | Admitting: Emergency Medicine

## 2020-07-03 ENCOUNTER — Emergency Department (HOSPITAL_COMMUNITY): Payer: Medicare Other

## 2020-07-03 ENCOUNTER — Encounter (HOSPITAL_COMMUNITY): Payer: Self-pay

## 2020-07-03 DIAGNOSIS — F17210 Nicotine dependence, cigarettes, uncomplicated: Secondary | ICD-10-CM | POA: Insufficient documentation

## 2020-07-03 DIAGNOSIS — I10 Essential (primary) hypertension: Secondary | ICD-10-CM | POA: Diagnosis not present

## 2020-07-03 DIAGNOSIS — Z79899 Other long term (current) drug therapy: Secondary | ICD-10-CM | POA: Insufficient documentation

## 2020-07-03 DIAGNOSIS — D473 Essential (hemorrhagic) thrombocythemia: Secondary | ICD-10-CM

## 2020-07-03 DIAGNOSIS — E876 Hypokalemia: Secondary | ICD-10-CM | POA: Diagnosis not present

## 2020-07-03 DIAGNOSIS — J45909 Unspecified asthma, uncomplicated: Secondary | ICD-10-CM | POA: Diagnosis not present

## 2020-07-03 DIAGNOSIS — R1010 Upper abdominal pain, unspecified: Secondary | ICD-10-CM | POA: Diagnosis not present

## 2020-07-03 DIAGNOSIS — D75839 Thrombocytosis, unspecified: Secondary | ICD-10-CM

## 2020-07-03 LAB — LIPASE, BLOOD: Lipase: 30 U/L (ref 11–51)

## 2020-07-03 LAB — CBC WITH DIFFERENTIAL/PLATELET
Abs Immature Granulocytes: 0.07 10*3/uL (ref 0.00–0.07)
Basophils Absolute: 0.1 10*3/uL (ref 0.0–0.1)
Basophils Relative: 1 %
Eosinophils Absolute: 0.1 10*3/uL (ref 0.0–0.5)
Eosinophils Relative: 1 %
HCT: 25 % — ABNORMAL LOW (ref 36.0–46.0)
Hemoglobin: 9.1 g/dL — ABNORMAL LOW (ref 12.0–15.0)
Immature Granulocytes: 1 %
Lymphocytes Relative: 22 %
Lymphs Abs: 3.2 10*3/uL (ref 0.7–4.0)
MCH: 30 pg (ref 26.0–34.0)
MCHC: 36.4 g/dL — ABNORMAL HIGH (ref 30.0–36.0)
MCV: 82.5 fL (ref 80.0–100.0)
Monocytes Absolute: 0.8 10*3/uL (ref 0.1–1.0)
Monocytes Relative: 6 %
Neutro Abs: 9.9 10*3/uL — ABNORMAL HIGH (ref 1.7–7.7)
Neutrophils Relative %: 69 %
Platelets: 912 10*3/uL (ref 150–400)
RBC: 3.03 MIL/uL — ABNORMAL LOW (ref 3.87–5.11)
RDW: 21.2 % — ABNORMAL HIGH (ref 11.5–15.5)
WBC: 14.1 10*3/uL — ABNORMAL HIGH (ref 4.0–10.5)
nRBC: 1.4 % — ABNORMAL HIGH (ref 0.0–0.2)

## 2020-07-03 LAB — URINALYSIS, ROUTINE W REFLEX MICROSCOPIC
Bilirubin Urine: NEGATIVE
Glucose, UA: NEGATIVE mg/dL
Hgb urine dipstick: NEGATIVE
Ketones, ur: NEGATIVE mg/dL
Nitrite: NEGATIVE
Protein, ur: NEGATIVE mg/dL
Specific Gravity, Urine: 1.012 (ref 1.005–1.030)
pH: 8 (ref 5.0–8.0)

## 2020-07-03 LAB — COMPREHENSIVE METABOLIC PANEL
ALT: 18 U/L (ref 0–44)
AST: 14 U/L — ABNORMAL LOW (ref 15–41)
Albumin: 3.2 g/dL — ABNORMAL LOW (ref 3.5–5.0)
Alkaline Phosphatase: 112 U/L (ref 38–126)
Anion gap: 12 (ref 5–15)
BUN: 7 mg/dL (ref 6–20)
CO2: 29 mmol/L (ref 22–32)
Calcium: 9.1 mg/dL (ref 8.9–10.3)
Chloride: 98 mmol/L (ref 98–111)
Creatinine, Ser: 0.64 mg/dL (ref 0.44–1.00)
GFR calc Af Amer: >60 mL/min (ref >60)
GFR calc non Af Amer: >60 mL/min (ref >60)
Glucose, Bld: 88 mg/dL (ref 70–99)
Potassium: 3.2 mmol/L — ABNORMAL LOW (ref 3.5–5.1)
Sodium: 139 mmol/L (ref 135–145)
Total Bilirubin: 1.1 mg/dL (ref 0.3–1.2)
Total Protein: 8.5 g/dL — ABNORMAL HIGH (ref 6.5–8.1)

## 2020-07-03 LAB — IRON,TIBC AND FERRITIN PANEL
Ferritin: 705 ng/mL — ABNORMAL HIGH (ref 15–150)
Iron Saturation: 23 % (ref 15–55)
Iron: 50 ug/dL (ref 27–159)
Total Iron Binding Capacity: 213 ug/dL — ABNORMAL LOW (ref 250–450)
UIBC: 163 ug/dL (ref 131–425)

## 2020-07-03 LAB — PREGNANCY, URINE: Preg Test, Ur: NEGATIVE

## 2020-07-03 LAB — HEPATITIS C ANTIBODY: Hep C Virus Ab: <0.1 s/co ratio (ref 0.0–0.9)

## 2020-07-03 LAB — VITAMIN D 25 HYDROXY (VIT D DEFICIENCY, FRACTURES): Vit D, 25-Hydroxy: 16.3 ng/mL — ABNORMAL LOW (ref 30.0–100.0)

## 2020-07-03 MED ORDER — POTASSIUM CHLORIDE CRYS ER 20 MEQ PO TBCR
40.0000 meq | EXTENDED_RELEASE_TABLET | Freq: Once | ORAL | Status: AC
  Administered 2020-07-03: 40 meq via ORAL
  Filled 2020-07-03: qty 2

## 2020-07-03 MED ORDER — IOHEXOL 9 MG/ML PO SOLN
ORAL | Status: AC
  Filled 2020-07-03: qty 500

## 2020-07-03 MED ORDER — SODIUM CHLORIDE 0.9 % IV SOLN: INTRAVENOUS | Status: DC

## 2020-07-03 MED ORDER — IOHEXOL 300 MG/ML  SOLN
100.0000 mL | Freq: Once | INTRAMUSCULAR | Status: AC | PRN
  Administered 2020-07-03: 100 mL via INTRAVENOUS

## 2020-07-03 MED ORDER — SODIUM CHLORIDE (PF) 0.9 % IJ SOLN
INTRAMUSCULAR | Status: AC
  Filled 2020-07-03: qty 50

## 2020-07-03 NOTE — ED Provider Notes (Signed)
[facility] DEPT Provider Note   CSN: [id number] Arrival date & time: 07/03/20  7902     History Chief Complaint  Patient presents with  . Abdominal Pain    Brittany Shannon is a 49 y.o. female.  HPI She presents for evaluation of abdominal distention, bloating, "gas, with irregular bowel movements for 1 month.  She saw her PCP yesterday, who did some plain images, which showed a possible distended stomach.  Her PCP therefore advised her to come here for further evaluation.  She reports she is able to eat but has somewhat decreased hunger.  She denies fever, chills, vomiting, dysuria, or urinary frequency.  No similar problems in the past.  There are no other known modifying factors.    Past Medical History:  Diagnosis Date  . Asthma   . 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 Homelessness   . Hypertension   . Sickle cell anemia [facility])     Patient Active Problem List   Diagnosis Date Noted  . 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 Generalized abdominal pain 07/02/2020  . Hypokalemia 04/28/2019  . Intractable nausea and vomiting 04/28/2019  . Sickle cell anemia with crisis ([place]) 04/28/2019  . QT prolongation 04/28/2019  . Sickle cell pain crisis ([place]) 04/28/2019  . 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 Chest pain 07/14/2016  . 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 Asthma 07/14/2016  . HTN (hypertension) 07/14/2016    Past Surgical History:  Procedure Laterality Date  . CHOLECYSTECTOMY    . EYE SURGERY    . SPLENECTOMY, TOTAL       OB History   No obstetric history on file.     Family History  Problem Relation Age of Onset  . Sickle cell trait Other     Social History   Tobacco Use  . Smoking status: Current Every Day Smoker    Types: Cigarettes  . Smokeless tobacco: Never Used  Substance Use Topics  . Alcohol use: No  . Drug use: No    Home Medications Prior to Admission medications   Medication Sig Start Date  End Date Taking? Authorizing Provider  albuterol (VENTOLIN HFA) 108 (90 Base) MCG/ACT inhaler Inhale 2 puffs into the lungs every 6 (six) hours as needed for wheezing. No refills. Patient needs an appointment. 07/02/20  Yes Vevelyn Francois, NP  bismuth subsalicylate (PEPTO BISMOL) 262 MG chewable tablet Chew 524 mg by mouth 3 (three) times daily as needed for indigestion.   Yes [provider]  doxylamine, Sleep, (UNISOM) 25 MG tablet Take 25 mg by mouth at bedtime as needed for sleep.   Yes [provider]  ibuprofen (ADVIL) 200 MG tablet Take 200 mg by mouth every 6 (six) hours as needed for mild pain or moderate pain.   Yes [provider]    Allergies    Patient has no known allergies.  Review of Systems   Review of Systems  All other systems reviewed and are negative.   Physical Exam Updated Vital Signs BP (!) 133/68   Pulse 68   Temp 98.2 F (36.8 C) (Oral)   Resp (!) 28   LMP 04/07/2013 Comment: neg preg 11/03/16  SpO2 94%   Physical Exam Vitals and nursing note reviewed.  Constitutional:      General: She is not in acute distress.    Appearance: She is well-developed. She is not ill-appearing, toxic-appearing or diaphoretic.  HENT:     Head: Normocephalic and  atraumatic.     Right Ear: External ear normal.     Left Ear: External ear normal.     Mouth/Throat:     Mouth: Mucous membranes are moist.     Pharynx: No oropharyngeal exudate or posterior oropharyngeal erythema.  Eyes:     Conjunctiva/sclera: Conjunctivae normal.     Pupils: Pupils are equal, round, and reactive to light.  Neck:     Trachea: Phonation normal.  Cardiovascular:     Rate and Rhythm: Normal rate and regular rhythm.     Heart sounds: Normal heart sounds.  Pulmonary:     Effort: Pulmonary effort is normal.     Breath sounds: Normal breath sounds.  Abdominal:     General: There is distension.     Palpations: Abdomen is soft. There is mass (Indistinct soft mass in  epigastrium.  It is soft in consistency.).     Tenderness: There is no abdominal tenderness. There is no guarding.     Hernia: No hernia is present.  Musculoskeletal:        General: Normal range of motion.     Cervical back: Normal range of motion and neck supple.  Skin:    General: Skin is warm and dry.  Neurological:     Mental Status: She is alert and oriented to person, place, and time.     Cranial Nerves: No cranial nerve deficit.     Sensory: No sensory deficit.     Motor: No abnormal muscle tone.     Coordination: Coordination normal.  Psychiatric:        Mood and Affect: Mood normal.        Behavior: Behavior normal.        Thought Content: Thought content normal.        Judgment: Judgment normal.     ED Results / Procedures / Treatments   Labs (all labs ordered are listed, but only abnormal results are displayed) Labs Reviewed  COMPREHENSIVE METABOLIC PANEL - Abnormal; Notable for the following components:      Result Value   Potassium 3.2 (*)    Total Protein 8.5 (*)    Albumin 3.2 (*)    AST 14 (*)    All other components within normal limits  URINALYSIS, ROUTINE W REFLEX MICROSCOPIC - Abnormal; Notable for the following components:   Leukocytes,Ua LARGE (*)    Bacteria, UA RARE (*)    All other components within normal limits  LIPASE, BLOOD  PREGNANCY, URINE  CBC WITH DIFFERENTIAL/PLATELET    EKG None  Radiology DG Abd 2 Views  Result Date: 07/02/2020 CLINICAL DATA:  Bloating and abdominal pain EXAM: ABDOMEN - 2 VIEW COMPARISON:  04/28/2019 FINDINGS: Cardiomegaly. No free air beneath the diaphragm. Nonobstructed gas pattern with radiopaque material in the colon. Central to epigastric opacity with mild convex margins. IMPRESSION: 1. Cardiomegaly 2. Nonobstructed gas pattern with radiopaque material in the colon 3. Rounded opacity in the mid to epigastric region of the abdomen with convex margin could relate to opacified distended stomach though mass is also  possible. Correlation with CT may be considered. Electronically Signed   By: Donavan Foil M.D.   On: 07/02/2020 17:01    Procedures Procedures (including critical care time)  Medications Ordered in ED Medications  0.9 %  sodium chloride infusion ( Intravenous New Bag/Given 07/03/20 1438)  sodium chloride (PF) 0.9 % injection (has no administration in time range)  iohexol (OMNIPAQUE) 300 MG/ML solution 100 mL (has no administration in  time range)  potassium chloride SA (KLOR-CON) CR tablet 40 mEq (has no administration in time range)  iohexol (OMNIPAQUE) 9 MG/ML oral solution (  Contrast Given 07/03/20 1430)    ED Course  I have reviewed the triage vital signs and the nursing notes.  Pertinent labs & imaging results that were available during my care of the patient were reviewed by me and considered in my medical decision making (see chart for details).  Clinical Course as of Jul 03 1541  Sat Jul 03, 2020  1536 Normal except potassium low, total protein high albumin low, AST low  Comprehensive metabolic panel(!) [EW]  8003 Normal  Lipase, blood [EW]  1537 Normal except large amount of leukocytes and rare bacteria with increased squamous epithelials, likely contaminant.  Urinalysis, Routine w reflex microscopic(!) [EW]  1537 Negative  Pregnancy, urine [EW]    Clinical Course User Index [EW] Daleen Bo, MD   MDM Rules/Calculators/A&P                           Patient Vitals for the past 24 hrs:  BP Temp Temp src Pulse Resp SpO2  07/03/20 1353 (!) 133/68 -- -- 68 (!) 28 94 %  07/03/20 0920 (!) 141/95 98.2 F (36.8 C) Oral 85 16 100 %      Medical Decision Making:  This patient is presenting for evaluation of.  Abdominal distention, bloating and decreased oral intake for 3 weeks, which does require a range of treatment options, and is a complaint that involves a moderate risk of morbidity and mortality. The differential diagnoses include.  Intestinal obstruction,  stomach distention, metabolic or infectious process.. I decided to review old records, and in summary Middle-aged female with history of sickle cell disease hemoglobin [place], hypertension, and hypokalemia..  I did not require the additional historical information from anyone.  Clinical Laboratory Tests Ordered, included CBC, Metabolic panel and Urinalysis. Review indicates hypokalemia, low albumin, increased protein. Radiologic Tests Ordered, included CT abdomen pelvis.        Critical Interventions-evaluation, laboratory testing, imaging ordered  Transition of care to Dr. Tyrone Nine, at 3:40 PM.  He will evaluate after imaging and arrange disposition.  CRITICAL CARE-no Performed by: Daleen Bo  Nursing Notes Reviewed/ Care Coordinated Applicable Imaging Reviewed Interpretation of Laboratory Data incorporated into ED treatment     Final Clinical Impression(s) / ED Diagnoses Final diagnoses:  Pain of upper abdomen  Hypokalemia    Rx / DC Orders ED Discharge Orders    None       Daleen Bo, MD 07/03/20 1542

## 2020-07-03 NOTE — Discharge Instructions (Signed)
Take 8 scoops of miralax in 32oz of whatever you would like to drink.(Gatorade comes in this size) You can also use a fleets enema which you can buy over the counter at the pharmacy.  Return for worsening abdominal pain, vomiting or fever. ? ?

## 2020-07-03 NOTE — ED Notes (Signed)
Pt ambulatory to RR 

## 2020-07-03 NOTE — ED Triage Notes (Signed)
After several attempts, we were able to locate pt. She tells [place] that she has had abd. Distention and mild discomfort "for about a month". He abd. Is distended and mildly firm. She denies n/v/d and tells me she has been having small soft (not diarrhea) bowel movements. She also denies any fever. She states her only abd. Surgery was a lap. Chole.

## 2020-07-03 NOTE — ED Notes (Signed)
Pt verbalizes understanding of DC instructions. Pt belongings returned and is ambulatory out of ED.  

## 2020-07-03 NOTE — ED Provider Notes (Signed)
I see the patient in signout from Dr. Eulis Foster.  Briefly the patient is a 49 year old female with a chief complaints of abdominal bloating.  Was seen yesterday by the family doctor had a plain film of the abdomen which was nondiagnostic and they were sent to the ED for CT scan.  At that time her platelets were found to be in the 900s.  Appears to be new for her.  No abdominal pain on my exam.  Mild hypokalemia and is already been prescribed potassium supplementation.  CT scan without acute finding.  There was some concern for pericardial effusion.  She is not having any chest pain shortness of breath feels like she may pass out.  Bedside ultrasound without obvious effusion.  We will have the patient follow-up with hematology.  Ultrasound ED Echo  Date/Time: 07/03/2020 4:45 PM Performed by: Deno Etienne, DO Authorized by: Deno Etienne, DO   Procedure details:    Indications comment:  Possible pericardial effusion, abdominal fullness   Views: subxiphoid, apical 4 chamber view and IVC view     Images: archived   Findings:    Pericardium: no pericardial effusion     LV Function: normal (>50% EF)     RV Diameter: normal     IVC: collapsed   Impression:    Impression: normal        Deno Etienne, DO 07/03/20 1647

## 2020-07-03 NOTE — ED Notes (Signed)
Called lab to check on blood results not showing as received.

## 2020-07-03 NOTE — ED Notes (Signed)
I called patient name in the lobby and outside to be triage and no one responded

## 2020-07-03 NOTE — ED Notes (Signed)
CRITICAL VALUE STICKER  CRITICAL VALUE: Platelet 912  MD NOTIFIED: Tyrone Nine MD

## 2020-07-05 ENCOUNTER — Other Ambulatory Visit: Payer: Self-pay | Admitting: Nurse Practitioner

## 2020-07-05 ENCOUNTER — Encounter: Payer: Self-pay | Admitting: Nurse Practitioner

## 2020-07-05 DIAGNOSIS — E559 Vitamin D deficiency, unspecified: Secondary | ICD-10-CM | POA: Insufficient documentation

## 2020-07-05 MED ORDER — ERGOCALCIFEROL 1.25 MG (50000 UT) PO CAPS: 50000.0000 [IU] | ORAL_CAPSULE | ORAL | 0 refills | Status: AC

## 2020-07-12 ENCOUNTER — Ambulatory Visit (HOSPITAL_COMMUNITY): Payer: Medicare Other

## 2020-11-23 ENCOUNTER — Other Ambulatory Visit: Payer: Self-pay | Admitting: Nurse Practitioner

## 2020-11-24 ENCOUNTER — Other Ambulatory Visit: Payer: Self-pay | Admitting: Nurse Practitioner

## 2021-02-14 ENCOUNTER — Other Ambulatory Visit: Payer: Self-pay | Admitting: Nurse Practitioner

## 2021-02-14 MED ORDER — ALBUTEROL SULFATE HFA 108 (90 BASE) MCG/ACT IN AERS: INHALATION_SPRAY | RESPIRATORY_TRACT | 1 refills | Status: AC

## 2021-07-25 ENCOUNTER — Other Ambulatory Visit: Payer: Self-pay | Admitting: Orthopedic Surgery

## 2021-07-25 DIAGNOSIS — M25561 Pain in right knee: Secondary | ICD-10-CM

## 2021-08-17 ENCOUNTER — Ambulatory Visit: Payer: Self-pay | Admitting: Nurse Practitioner

## 2021-09-07 ENCOUNTER — Telehealth: Payer: Self-pay

## 2021-09-07 NOTE — Telephone Encounter (Signed)
BP med to be refill  [phone] to call pt directly for questions

## 2021-09-07 NOTE — Telephone Encounter (Signed)
Patient notified there is no blood pressure medication on list, upcoming appointment ton 10/20.

## 2021-09-29 ENCOUNTER — Ambulatory Visit: Payer: Self-pay | Admitting: Nurse Practitioner

## 2021-11-16 ENCOUNTER — Ambulatory Visit (INDEPENDENT_AMBULATORY_CARE_PROVIDER_SITE_OTHER): Payer: Medicare Other | Admitting: Sports Medicine

## 2021-11-16 VITALS — Ht 69.0 in | Wt 128.0 lb

## 2021-11-16 DIAGNOSIS — M25561 Pain in right knee: Secondary | ICD-10-CM

## 2021-11-16 DIAGNOSIS — R251 Tremor, unspecified: Secondary | ICD-10-CM | POA: Diagnosis not present

## 2021-11-16 NOTE — Progress Notes (Signed)
PCP: Vevelyn Francois, NP  Subjective:   HPI: Patient is a 50 y.o. female here for evaluation of right knee pain since May 2022.  Brittany Shannon reports that on Apr 20, 2021 she was walking when she tripped over a cable cord and fell forward onto her left side and her right knee fell inward in a valgus fashion.  She was able to walk although with antalgic gait and states that she had difficulty extending the knee since that time.  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   Mild offset of the articular surface lateral tibial plateau which may be related to sequela of remote fracture possible degenerative remodeling. No gross ligament or meniscal injury."  Today, she states that she still has some ongoing pain in the right knee although it is improved from previously.  One of her bigger concerns is that she has had a spasm/tremor of the right lower extremity since she began starting some PT exercises shortly after the knee injury.  She does PT about twice weekly.  She states this has improved her pain to some extent and improved her strength.  She follows with her primary physician who has started her on baclofen 5 mg twice daily to help with the tremors/spasms although these still persist.  She denies any new injuries at this time.  She has been wearing a knee brace and ambulating with a cane for assistance.  She states she is unable to get an MRI because she has a cochlear implant.  The pain is all throughout the anterior aspect of her knee but she points to the pain being at the superior aspect of the patella and up into the quadricep that has caused her more pain as well.  She believes this may be due to her spasming.  Past Medical History:  Diagnosis Date   Asthma    Deafness in left ear    Homelessness    Hypertension     Sickle cell anemia (HCC)     Current Outpatient Medications on File Prior to Visit  Medication Sig Dispense Refill   albuterol (VENTOLIN HFA) 108 (90 Base) MCG/ACT inhaler INHALE 2 PUFFS INTO THE LUNGS EVERY 6 HOURS AS NEEDED FOR WHEEZING 8.5 g 1   bismuth subsalicylate (PEPTO BISMOL) 262 MG chewable tablet Chew 524 mg by mouth 3 (three) times daily as needed for indigestion.     doxylamine, Sleep, (UNISOM) 25 MG tablet Take 25 mg by mouth at bedtime as needed for sleep.     ibuprofen (ADVIL) 200 MG tablet Take 200 mg by mouth every 6 (six) hours as needed for mild pain or moderate pain.     No current facility-administered medications on file prior to visit.    Past Surgical History:  Procedure Laterality Date   CHOLECYSTECTOMY     EYE SURGERY     SPLENECTOMY, TOTAL      No Known Allergies  Ht 5\' 9"  (1.753 m)   Wt 128 lb (58.1 kg)   LMP 04/07/2013 Comment: neg preg 11/03/16  BMI 18.90 kg/m   No flowsheet data found.  No flowsheet data found.      Objective:  Physical Exam:  Gen: Well-appearing, in no acute distress; non-toxic CV: Regular Rate. Well-perfused. Warm.  Resp: Breathing unlabored on room air; no wheezing. Psych: Fluid speech in conversation;  appropriate affect; normal thought process Neuro: Sensation intact throughout. No gross coordination deficits.  MSK:  - Right knee: There is mild TTP at the superior pole of the patella over both medial and lateral joint lines.  There is no significant effusion palpated.  No ecchymosis or erythema.  There is limited flexion and stiffness with range of motion from about 5 degrees of extension to 95 degrees flexion.  I do appreciate some stiffness about the joint when flexing and extending.  Lachman test appears intact.  Remainder of provocative testing difficult to appreciate as when I would put the knee through any sort of motion the patient's entire right lower extremity with tremor considerably.  She is able to move  about the hip and knee and ankle without significant pain or difficulty.  - RLE: Right lower extremity with tremor throughout the examination.  The tremor/spasm is somewhat distractible.  Tremor is made worse when palpating the knee and the quadricep muscle of the right leg.  Ambulates with a cane.   Assessment & Plan:  1. Right knee pain s/p fall May 2022 2. RLE tremor 3. Sickle Cell anemia  Her exam does reveal likely patellofemoral arthritis, although given her fall and her CT scan is somewhat concerning for possible previous lateral tibial plateau fracture with reported "offset" of the articular surface.  Given this, we will repeat three-view x-rays of the knee to better quantify whether this was a true fracture and/or if it is healed or not.  In the meantime, she is to remain in her knee brace for stability.  Continue with physical therapy for the quadricep muscles.  Depending on the x-ray, she may benefit from occasional anti-inflammatory medication.  I will call her with the x-ray results to discuss next management plans.  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  It is somewhat distractible in conversation and tremors less when not focused on the knee.  Regardless of the etiology, I do not feel like this is causing her knee pain.  I did discuss following up with her primary care physician to better characterize the right lower extremity tremor and see if additional imaging or testing would be required.  Patient is agreeable to this plan.  Brittany Barman, DO PGY-4, Sports Medicine Fellow [facility]  Addendum:  I was the preceptor for this visit and available for immediate consultation.  Karlton Lemon MD Kirt Boys

## 2021-11-17 ENCOUNTER — Ambulatory Visit
Admission: RE | Admit: 2021-11-17 | Discharge: 2021-11-17 | Disposition: A | Discharge status: Home / Self Care | Payer: Medicare Other | Source: Ambulatory Visit | Attending: Sports Medicine | Admitting: Sports Medicine

## 2021-11-17 DIAGNOSIS — M25561 Pain in right knee: Secondary | ICD-10-CM

## 2021-11-23 ENCOUNTER — Telehealth: Payer: Self-pay | Admitting: Sports Medicine

## 2021-11-23 NOTE — Telephone Encounter (Signed)
I did call the patient today, the call went straight to voicemail, but she did confirm identity on that voicemail.  I left the nondescript voicemail describing her x-ray results, I also sent her a MyChart message describing these.  In general, this shows moderate arthritis throughout the knee in all compartments, although no acute fracture.  I did recommend that she continue her formal physical therapy and that she talk with her primary physician about a referral to neurology in terms of the tremor or shaking of the leg.  I did state in the voicemail she may call back here in the clinic if she would like to discuss any management going forward.  Elba Barman, DO PGY-4, Sports Medicine Fellow [place]

## 2021-11-24 ENCOUNTER — Encounter (HOSPITAL_COMMUNITY): Payer: Self-pay | Admitting: Pharmacy Technician

## 2021-11-24 ENCOUNTER — Emergency Department (HOSPITAL_COMMUNITY)
Admission: EM | Admit: 2021-11-24 | Discharge: 2021-11-24 | Disposition: A | Discharge status: Home / Self Care | Payer: Medicare Other | Attending: Emergency Medicine | Admitting: Emergency Medicine

## 2021-11-24 DIAGNOSIS — F17210 Nicotine dependence, cigarettes, uncomplicated: Secondary | ICD-10-CM | POA: Diagnosis not present

## 2021-11-24 DIAGNOSIS — J45909 Unspecified asthma, uncomplicated: Secondary | ICD-10-CM | POA: Insufficient documentation

## 2021-11-24 DIAGNOSIS — I10 Essential (primary) hypertension: Secondary | ICD-10-CM | POA: Insufficient documentation

## 2021-11-24 DIAGNOSIS — M62838 Other muscle spasm: Secondary | ICD-10-CM

## 2021-11-24 LAB — CBC WITH DIFFERENTIAL/PLATELET
Abs Immature Granulocytes: 0 10*3/uL (ref 0.00–0.07)
Basophils Absolute: 0.2 10*3/uL — ABNORMAL HIGH (ref 0.0–0.1)
Basophils Relative: 1 %
Eosinophils Absolute: 0 10*3/uL (ref 0.0–0.5)
Eosinophils Relative: 0 %
HCT: 29 % — ABNORMAL LOW (ref 36.0–46.0)
Hemoglobin: 10.6 g/dL — ABNORMAL LOW (ref 12.0–15.0)
Lymphocytes Relative: 12 %
Lymphs Abs: 1.9 10*3/uL (ref 0.7–4.0)
MCH: 33.1 pg (ref 26.0–34.0)
MCHC: 36.6 g/dL — ABNORMAL HIGH (ref 30.0–36.0)
MCV: 90.6 fL (ref 80.0–100.0)
Monocytes Absolute: 1.9 10*3/uL — ABNORMAL HIGH (ref 0.1–1.0)
Monocytes Relative: 12 %
Neutro Abs: 11.6 10*3/uL — ABNORMAL HIGH (ref 1.7–7.7)
Neutrophils Relative %: 75 %
Platelets: 246 10*3/uL (ref 150–400)
RBC: 3.2 MIL/uL — ABNORMAL LOW (ref 3.87–5.11)
RDW: 15.3 % (ref 11.5–15.5)
WBC: 15.5 10*3/uL — ABNORMAL HIGH (ref 4.0–10.5)
nRBC: 1.1 % — ABNORMAL HIGH (ref 0.0–0.2)
nRBC: 2 /100 WBC — ABNORMAL HIGH

## 2021-11-24 LAB — COMPREHENSIVE METABOLIC PANEL
ALT: 28 U/L (ref 0–44)
AST: 39 U/L (ref 15–41)
Albumin: 4.3 g/dL (ref 3.5–5.0)
Alkaline Phosphatase: 73 U/L (ref 38–126)
Anion gap: 10 (ref 5–15)
BUN: 15 mg/dL (ref 6–20)
CO2: 25 mmol/L (ref 22–32)
Calcium: 10 mg/dL (ref 8.9–10.3)
Chloride: 103 mmol/L (ref 98–111)
Creatinine, Ser: 0.8 mg/dL (ref 0.44–1.00)
GFR, Estimated: >60 mL/min (ref >60)
Glucose, Bld: 108 mg/dL — ABNORMAL HIGH (ref 70–99)
Potassium: 3.8 mmol/L (ref 3.5–5.1)
Sodium: 138 mmol/L (ref 135–145)
Total Bilirubin: 3.3 mg/dL — ABNORMAL HIGH (ref 0.3–1.2)
Total Protein: 8.3 g/dL — ABNORMAL HIGH (ref 6.5–8.1)

## 2021-11-24 LAB — MAGNESIUM: Magnesium: 2 mg/dL (ref 1.7–2.4)

## 2021-11-24 NOTE — Telephone Encounter (Signed)
I spoke with the pt and did assure her that Dr. Rolena Infante called her yesterday and left a VM as well as sent a mychart message. I went over his recommendations with her and she agrees and understands with the plan.

## 2021-11-24 NOTE — ED Provider Notes (Signed)
[facility] EMERGENCY DEPARTMENT Provider Note  CSN: [id number] Arrival date & time: 11/24/21 1358    History No chief complaint on file.   Brittany Shannon is a 50 y.o. female here for evaluation of R leg spasms. She reports this has been a problem off and on since she fell and injured her R knee in May. She has been doing physical therapy and seeing Sports Medicine for the knee. She reports pain is worsening and moving from her R knee and into her R thigh/hip causing her leg to spasm. Per Sports Med note, PCP has started her on Baclofen. She has been advised to see Neurology but has not yet had that follow up. She reports her symptoms have been worse the last 2 days since riding in the backseat of a car.    Past Medical History:  Diagnosis Date   Asthma    Deafness in left ear    Homelessness    Hypertension    Sickle cell anemia (HCC)     Past Surgical History:  Procedure Laterality Date   CHOLECYSTECTOMY     EYE SURGERY     SPLENECTOMY, TOTAL      Family History  Problem Relation Age of Onset   Sickle cell trait Other     Social History   Tobacco Use   Smoking status: Every Day    Types: Cigarettes   Smokeless tobacco: Never  Substance Use Topics   Alcohol use: No   Drug use: No     Home Medications Prior to Admission medications   Medication Sig Start Date End Date Taking? Authorizing Provider  albuterol (VENTOLIN HFA) 108 (90 Base) MCG/ACT inhaler INHALE 2 PUFFS INTO THE LUNGS EVERY 6 HOURS AS NEEDED FOR WHEEZING 02/14/21   Vevelyn Francois, NP  bismuth subsalicylate (PEPTO BISMOL) 262 MG chewable tablet Chew 524 mg by mouth 3 (three) times daily as needed for indigestion.    [provider]  doxylamine, Sleep, (UNISOM) 25 MG tablet Take 25 mg by mouth at bedtime as needed for sleep.    [provider]  ibuprofen (ADVIL) 200 MG tablet Take 200 mg by mouth every 6 (six) hours as needed for mild pain or moderate pain.    [provider]     Allergies    Patient has no known allergies.   Review of Systems   Review of Systems A comprehensive review of systems was completed and negative except as noted in HPI.    Physical Exam BP (!) 177/114    Pulse (!) 101    Temp 99 F (37.2 C)    Resp 18    LMP 04/07/2013 Comment: neg preg 11/03/16   SpO2 94%   Physical Exam Vitals and nursing note reviewed.  HENT:     Head: Normocephalic.     Nose: Nose normal.  Eyes:     Extraocular Movements: Extraocular movements intact.  Pulmonary:     Effort: Pulmonary effort is normal.  Musculoskeletal:        General: Normal range of motion.     Cervical back: Neck supple.     Comments: Coarse tremor of RLE is not present when distracted or sleeping.   Skin:    Findings: No rash (on exposed skin).  Neurological:     Mental Status: She is alert and oriented to person, place, and time.     Sensory: No sensory deficit.     Motor: No weakness.  Deep Tendon Reflexes: Reflexes normal.  Psychiatric:        Mood and Affect: Mood normal.     ED Results / Procedures / Treatments   Labs (all labs ordered are listed, but only abnormal results are displayed) Labs Reviewed  COMPREHENSIVE METABOLIC PANEL - Abnormal; Notable for the following components:      Result Value   Glucose, Bld 108 (*)    Total Protein 8.3 (*)    Total Bilirubin 3.3 (*)    All other components within normal limits  CBC WITH DIFFERENTIAL/PLATELET - Abnormal; Notable for the following components:   WBC 15.5 (*)    RBC 3.20 (*)    Hemoglobin 10.6 (*)    HCT 29.0 (*)    MCHC 36.6 (*)    nRBC 1.1 (*)    Neutro Abs 11.6 (*)    Monocytes Absolute 1.9 (*)    Basophils Absolute 0.2 (*)    nRBC 2 (*)    All other components within normal limits  MAGNESIUM    EKG None  Radiology No results found.  Procedures Procedures  Medications Ordered in the ED Medications - No data to display   MDM Rules/Calculators/A&P MDM  Patient with chronic  RLE pain/spasms. Normal labs ordered in triage. Advised that this problem would require further workup as an outpatient with PCP and Neurology. No further ED workup or intervention required. She reports she has had improvement with heat but was unable to find a heating pad. She was encouraged to look elsewhere as a heating pad should be readily available at outpatient pharmacies and other retail stores.  ED Course  I have reviewed the triage vital signs and the nursing notes.  Pertinent labs & imaging results that were available during my care of the patient were reviewed by me and considered in my medical decision making (see chart for details).     Final Clinical Impression(s) / ED Diagnoses Final diagnoses:  Muscle spasm of right leg    Rx / DC Orders ED Discharge Orders     None        Truddie Hidden, MD 11/24/21 1942

## 2021-11-24 NOTE — ED Triage Notes (Signed)
Pt bib ems with RLE pain and spasms. Reports fall in may and has had this problem intermittently since. VSS with ems.

## 2021-11-24 NOTE — ED Provider Notes (Signed)
Emergency Medicine Provider Triage Evaluation Note  Brittany Shannon , a 50 y.o. female  was evaluated in triage.  Pt complains of RLE pain and spasms.  She has had this since may.  No fevers.  This episode started two days ago.  She feels its from riding in a car.  He has been seen for this before, due to a cochlear implant was reportedly unable to have a MRI.  Review of Systems  Positive:  Negative: RLE pain and spasms  Physical Exam  BP (!) 177/114    Pulse (!) 101    Temp 99 F (37.2 C)    Resp 18    LMP 04/07/2013 Comment: neg preg 11/03/16   SpO2 94%  Gen:   Awake, no distress   Resp:  Normal effort  MSK:  Tremulous Other:  Normal speech.   Medical Decision Making  Medically screening exam initiated at 4:24 PM.  Appropriate orders placed.  Brittany Shannon was informed that the remainder of the evaluation will be completed by another provider, this initial triage assessment does not replace that evaluation, and the importance of remaining in the ED until their evaluation is complete.  Note: Portions of this report may have been transcribed using voice recognition software. Every effort was made to ensure accuracy; however, inadvertent computerized transcription errors may be present    Lorin Glass, PA-C 11/24/21 1628    Tegeler, Gwenyth Allegra, MD 11/24/21 [phone]

## 2021-11-30 ENCOUNTER — Emergency Department (HOSPITAL_COMMUNITY)
Admission: EM | Admit: 2021-11-30 | Discharge: 2021-11-30 | Disposition: A | Discharge status: Home / Self Care | Payer: Medicare Other | Attending: Emergency Medicine | Admitting: Emergency Medicine

## 2021-11-30 ENCOUNTER — Ambulatory Visit (INDEPENDENT_AMBULATORY_CARE_PROVIDER_SITE_OTHER): Payer: Medicare Other | Admitting: Nurse Practitioner

## 2021-11-30 ENCOUNTER — Other Ambulatory Visit: Payer: Self-pay

## 2021-11-30 ENCOUNTER — Encounter (HOSPITAL_COMMUNITY): Payer: Self-pay

## 2021-11-30 VITALS — BP 141/96 | HR 100 | Temp 98.8°F | Ht 69.0 in | Wt 119.0 lb

## 2021-11-30 DIAGNOSIS — I10 Essential (primary) hypertension: Secondary | ICD-10-CM

## 2021-11-30 DIAGNOSIS — H9190 Unspecified hearing loss, unspecified ear: Secondary | ICD-10-CM | POA: Insufficient documentation

## 2021-11-30 DIAGNOSIS — M79604 Pain in right leg: Secondary | ICD-10-CM | POA: Diagnosis not present

## 2021-11-30 DIAGNOSIS — J45909 Unspecified asthma, uncomplicated: Secondary | ICD-10-CM | POA: Diagnosis not present

## 2021-11-30 DIAGNOSIS — D5720 Sickle-cell/Hb-C disease without crisis: Secondary | ICD-10-CM | POA: Diagnosis not present

## 2021-11-30 DIAGNOSIS — F22 Delusional disorders: Secondary | ICD-10-CM

## 2021-11-30 DIAGNOSIS — Z5320 Procedure and treatment not carried out because of patient's decision for unspecified reasons: Secondary | ICD-10-CM

## 2021-11-30 DIAGNOSIS — Z7951 Long term (current) use of inhaled steroids: Secondary | ICD-10-CM | POA: Diagnosis not present

## 2021-11-30 DIAGNOSIS — D72829 Elevated white blood cell count, unspecified: Secondary | ICD-10-CM

## 2021-11-30 DIAGNOSIS — F17210 Nicotine dependence, cigarettes, uncomplicated: Secondary | ICD-10-CM | POA: Insufficient documentation

## 2021-11-30 DIAGNOSIS — M62838 Other muscle spasm: Secondary | ICD-10-CM

## 2021-11-30 LAB — CBC WITH DIFFERENTIAL/PLATELET
Abs Immature Granulocytes: 0.04 10*3/uL (ref 0.00–0.07)
Basophils Absolute: 0.1 10*3/uL (ref 0.0–0.1)
Basophils Relative: 1 %
Eosinophils Absolute: 0.5 10*3/uL (ref 0.0–0.5)
Eosinophils Relative: 5 %
HCT: 25 % — ABNORMAL LOW (ref 36.0–46.0)
Hemoglobin: 9.4 g/dL — ABNORMAL LOW (ref 12.0–15.0)
Immature Granulocytes: 0 %
Lymphocytes Relative: 23 %
Lymphs Abs: 2.5 10*3/uL (ref 0.7–4.0)
MCH: 33.3 pg (ref 26.0–34.0)
MCHC: 37.6 g/dL — ABNORMAL HIGH (ref 30.0–36.0)
MCV: 88.7 fL (ref 80.0–100.0)
Monocytes Absolute: 0.9 10*3/uL (ref 0.1–1.0)
Monocytes Relative: 9 %
Neutro Abs: 6.6 10*3/uL (ref 1.7–7.7)
Neutrophils Relative %: 62 %
Platelets: 248 10*3/uL (ref 150–400)
RBC: 2.82 MIL/uL — ABNORMAL LOW (ref 3.87–5.11)
RDW: 15.1 % (ref 11.5–15.5)
WBC: 10.6 10*3/uL — ABNORMAL HIGH (ref 4.0–10.5)
nRBC: 0.8 % — ABNORMAL HIGH (ref 0.0–0.2)

## 2021-11-30 LAB — RETICULOCYTES
Immature Retic Fract: 36 % — ABNORMAL HIGH (ref 2.3–15.9)
RBC.: 2.82 MIL/uL — ABNORMAL LOW (ref 3.87–5.11)
Retic Count, Absolute: 217.7 10*3/uL — ABNORMAL HIGH (ref 19.0–186.0)
Retic Ct Pct: 7.7 % — ABNORMAL HIGH (ref 0.4–3.1)

## 2021-11-30 LAB — COMPREHENSIVE METABOLIC PANEL
ALT: 44 U/L (ref 0–44)
AST: 44 U/L — ABNORMAL HIGH (ref 15–41)
Albumin: 3.7 g/dL (ref 3.5–5.0)
Alkaline Phosphatase: 89 U/L (ref 38–126)
Anion gap: 7 (ref 5–15)
BUN: 23 mg/dL — ABNORMAL HIGH (ref 6–20)
CO2: 29 mmol/L (ref 22–32)
Calcium: 9.3 mg/dL (ref 8.9–10.3)
Chloride: 101 mmol/L (ref 98–111)
Creatinine, Ser: 0.63 mg/dL (ref 0.44–1.00)
GFR, Estimated: >60 mL/min (ref >60)
Glucose, Bld: 101 mg/dL — ABNORMAL HIGH (ref 70–99)
Potassium: 3.4 mmol/L — ABNORMAL LOW (ref 3.5–5.1)
Sodium: 137 mmol/L (ref 135–145)
Total Bilirubin: 1.4 mg/dL — ABNORMAL HIGH (ref 0.3–1.2)
Total Protein: 7.6 g/dL (ref 6.5–8.1)

## 2021-11-30 MED ORDER — MORPHINE SULFATE (PF) 4 MG/ML IV SOLN
4.0000 mg | Freq: Once | INTRAVENOUS | Status: AC
  Administered 2021-11-30: 14:00:00 4 mg via INTRAVENOUS
  Filled 2021-11-30: qty 1

## 2021-11-30 MED ORDER — SODIUM CHLORIDE 0.45 % IV SOLN: INTRAVENOUS | Status: DC

## 2021-11-30 NOTE — Progress Notes (Signed)
[address] [place], [place]  [address] Phone:  [phone]   Fax:  [phone]   Established Patient Office Visit  Subjective:  Patient ID: Brittany Shannon, female    DOB: 12-22-1970  Age: 50 y.o. MRN: [id number]  CC:  Chief Complaint  Patient presents with   Follow-up    Pt stated she sprained her knee in May and she has been having a lot of muscle spasm. Pt said she has been taking baclofen. Pt stated she has a appointment for neurologist in February    HPI Brittany Shannon presents for muscle pain. She has been lost to follow up. She  has a past medical history of Asthma, Deafness in left ear, Homelessness, Hypertension, and Sickle cell anemia ([place]).   She  is having muscle spasms. She reports been seen by ortho for a twisted knee. She has been followed by PT. She reports waiting for an apt with neurology. She was recently seen in the ED and was informed that she needed to be seen for her blood.   Denies headache, dizziness, visual changes, shortness of breath, dyspnea on exertion, chest pain, nausea, vomiting or any edema.    Past Medical History:  Diagnosis Date   Asthma    Deafness in left ear    Homelessness    Hypertension    Sickle cell anemia (HCC)     Past Surgical History:  Procedure Laterality Date   CHOLECYSTECTOMY     EYE SURGERY     SPLENECTOMY, TOTAL      Family History  Problem Relation Age of Onset   Sickle cell trait Other     Social History   Socioeconomic History   Marital status: Single    Spouse name: Not on file   Number of children: Not on file   Years of education: Not on file   Highest education level: Not on file  Occupational History   Not on file  Tobacco Use   Smoking status: Every Day    Types: Cigarettes   Smokeless tobacco: Never  Substance and Sexual Activity   Alcohol use: No   Drug use: No   Sexual activity: Yes  Other Topics Concern   Not on file  Social  History Narrative   Not on file   Social Determinants of Health   Financial Resource Strain: Not on file  Food Insecurity: Not on file  Transportation Needs: Not on file  Physical Activity: Not on file  Stress: Not on file  Social Connections: Not on file  Intimate Partner Violence: Not on file    Outpatient Medications Prior to Visit  Medication Sig Dispense Refill   albuterol (VENTOLIN HFA) 108 (90 Base) MCG/ACT inhaler INHALE 2 PUFFS INTO THE LUNGS EVERY 6 HOURS AS NEEDED FOR WHEEZING 8.5 g 1   baclofen (LIORESAL) 10 MG tablet Take 10 mg by mouth 3 (three) times daily.     bismuth subsalicylate (PEPTO BISMOL) 262 MG chewable tablet Chew 524 mg by mouth 3 (three) times daily as needed for indigestion.     Diclofenac Sodium 3 % GEL SMARTSIG:Gram(s) Topical Twice Daily     doxylamine, Sleep, (UNISOM) 25 MG tablet Take 25 mg by mouth at bedtime as needed for sleep. (Patient not taking: Reported on 11/30/2021)     ibuprofen (ADVIL) 200 MG tablet Take 200 mg by mouth every 6 (six) hours as needed for mild pain or moderate pain. (Patient not taking: Reported on 11/30/2021)  oxyCODONE-acetaminophen (PERCOCET/ROXICET) 5-325 MG tablet Take by mouth.     No facility-administered medications prior to visit.    No Known Allergies  ROS Review of Systems    Objective:    Physical Exam HENT:     Head: Normocephalic and atraumatic.  Neurological:     Mental Status: She is alert.    BP (!) 141/96    Pulse 100    Temp 98.8 F (37.1 C)    Ht 5\' 9"  (1.753 m)    Wt 119 lb (54 kg)    LMP 04/07/2013 Comment: neg preg 11/03/16   SpO2 98%    BMI 17.57 kg/m  Wt Readings from Last 3 Encounters:  11/30/21 119 lb (54 kg)  11/16/21 128 lb (58.1 kg)  07/02/20 120 lb (54.4 kg)     Health Maintenance Due  Topic Date Due   COVID-19 Vaccine (1) Never done   Pneumococcal Vaccine 50-43 Years old (1 - PCV) Never done   TETANUS/TDAP  Never done   Zoster Vaccines- Shingrix (1 of 2)  Never done   PAP SMEAR-Modifier  Never done   COLONOSCOPY (Pts 45-48yrs Insurance coverage will need to be confirmed)  Never done   MAMMOGRAM  Never done   INFLUENZA VACCINE  Never done    There are no preventive care reminders to display for this patient.  No results found for: TSH Lab Results  Component Value Date   WBC 15.5 (H) 11/24/2021   HGB 10.6 (L) 11/24/2021   HCT 29.0 (L) 11/24/2021   MCV 90.6 11/24/2021   PLT 246 11/24/2021   Lab Results  Component Value Date   NA 138 11/24/2021   K 3.8 11/24/2021   CO2 25 11/24/2021   GLUCOSE 108 (H) 11/24/2021   BUN 15 11/24/2021   CREATININE 0.80 11/24/2021   BILITOT 3.3 (H) 11/24/2021   ALKPHOS 73 11/24/2021   AST 39 11/24/2021   ALT 28 11/24/2021   PROT 8.3 (H) 11/24/2021   ALBUMIN 4.3 11/24/2021   CALCIUM 10.0 11/24/2021   ANIONGAP 10 11/24/2021   Lab Results  Component Value Date   CHOL 119 04/29/2019   Lab Results  Component Value Date   HDL 37 (L) 04/29/2019   Lab Results  Component Value Date   LDLCALC 70 04/29/2019   Lab Results  Component Value Date   TRIG 62 04/29/2019   Lab Results  Component Value Date   CHOLHDL 3.2 04/29/2019   Lab Results  Component Value Date   HGBA1C 4.4 07/23/2019      Assessment & Plan:   Problem List Items Addressed This Visit   None   No orders of the defined types were placed in this encounter.   Follow-up: No follow-ups on file.    Vevelyn Francois, NP

## 2021-11-30 NOTE — Discharge Instructions (Signed)
Please follow-up with your primary care doctor regarding your symptoms today.  I would recommend you continue taking your prescribed baclofen for your muscle spasms.  Please make sure you also follow-up with your neurologist at your appointment in February.  If you develop any new or worsening symptoms please come back to the emergency department.

## 2021-11-30 NOTE — ED Provider Notes (Signed)
[facility] Provider Note   CSN: [id number] Arrival date & time: 11/30/21  1240     History No chief complaint on file.   Brittany Shannon is a 50 y.o. female.  HPI Patient is a 50 year old female with a history of hemoglobin [place] disease, sickle cell pain crisis, QT prolongation, hypertension, asthma, who presents to the emergency department due to pain in the right leg.  Patient states that she injured her right knee many months ago and has been experiencing muscle cramps in the right leg intermittently since then.  She was started on baclofen for this.  She states that she has been evaluated by her PCP and in the ED for this in the past.  She has a follow-up appointment with neurology in February.  She states that she saw her PCP earlier today and noted feeling weaker than normal and was told to go to see hematology to be evaluated as well given her history of sickle cell [place] disease.  They unfortunately cannot see her today so she came to the emergency department for further evaluation.  Denies chest pain, shortness of breath, abdominal pain, vomiting, diarrhea.    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     Family History  Problem Relation Age of Onset   Sickle cell trait Other     Social History   Tobacco Use   Smoking status: Every Day    Types: Cigarettes   Smokeless tobacco: Never  Substance Use Topics   Alcohol use: No   Drug use: No    Home Medications Prior to Admission medications   Medication Sig Start Date End Date Taking? Authorizing Provider  albuterol (VENTOLIN HFA) 108 (90 Base) MCG/ACT inhaler INHALE 2 PUFFS INTO THE LUNGS EVERY 6 HOURS AS NEEDED FOR WHEEZING Patient taking differently: 1-2 puffs every 6 (six) hours as needed for wheezing or shortness of breath. 02/14/21  Yes Vevelyn Francois, NP  baclofen (LIORESAL) 10 MG tablet Take 10 mg by mouth 3 (three) times daily. 11/14/21  Yes [provider]  oxyCODONE-acetaminophen (PERCOCET/ROXICET) 5-325 MG tablet Take 1 tablet by mouth every 6 (six) hours as needed for moderate pain. 11/26/21  Yes [provider]    Allergies    Patient has no  known allergies.  Review of Systems   Review of Systems  All other systems reviewed and are negative. Ten systems reviewed and are negative for acute change, except as noted in the HPI.   Physical Exam Updated Vital Signs BP (!) 145/96    Pulse 83    Temp 98.1 F (36.7 C) (Oral)    Resp 17    Ht 5\' 9"  (1.753 m)    Wt 54 kg    LMP 04/07/2013 Comment: neg preg 11/03/16   SpO2 96%    BMI 17.58 kg/m   Physical Exam Vitals and nursing note reviewed.  Constitutional:      General: She is not in acute distress.    Appearance: Normal appearance. She is not ill-appearing, toxic-appearing or diaphoretic.  HENT:      Head: Normocephalic and atraumatic.     Right Ear: External ear normal.     Left Ear: External ear normal.     Nose: Nose normal.     Mouth/Throat:     Mouth: Mucous membranes are moist.     Pharynx: Oropharynx is clear. No oropharyngeal exudate or posterior oropharyngeal erythema.  Eyes:     Extraocular Movements: Extraocular movements intact.  Cardiovascular:     Rate and Rhythm: Normal rate and regular rhythm.     Pulses: Normal pulses.     Heart sounds: Normal heart sounds. No murmur heard.   No friction rub. No gallop.  Pulmonary:     Effort: Pulmonary effort is normal. No respiratory distress.     Breath sounds: Normal breath sounds. No stridor. No wheezing, rhonchi or rales.  Abdominal:     General: Abdomen is flat.     Tenderness: There is no abdominal tenderness.  Musculoskeletal:        General: Tenderness present. Normal range of motion.     Cervical back: Normal range of motion and neck supple. No tenderness.     Comments: Mild TTP noted diffusely in the right lower extremity that appears to be worst in the right hip and right knee.  Full range of motion of the right hip, knee, and ankle.  Distal sensation intact.  2+ DP pulses.  Skin:    General: Skin is warm and dry.  Neurological:     General: No focal deficit present.     Mental Status: She is alert and oriented to person, place, and time.  Psychiatric:        Mood and Affect: Mood normal.        Behavior: Behavior normal.   ED Results / Procedures / Treatments   Labs (all labs ordered are listed, but only abnormal results are displayed) Labs Reviewed  COMPREHENSIVE METABOLIC PANEL - Abnormal; Notable for the following components:      Result Value   Potassium 3.4 (*)    Glucose, Bld 101 (*)    BUN 23 (*)    AST 44 (*)    Total Bilirubin 1.4 (*)    All other components within normal limits  CBC WITH DIFFERENTIAL/PLATELET - Abnormal; Notable for the following components:   WBC 10.6 (*)    RBC 2.82 (*)     Hemoglobin 9.4 (*)    HCT 25.0 (*)    MCHC 37.6 (*)    nRBC 0.8 (*)    All other components within normal limits  RETICULOCYTES - Abnormal; Notable for the following components:   Retic Ct Pct 7.7 (*)    RBC. 2.82 (*)  Retic Count, Absolute 217.7 (*)    Immature Retic Fract 36.0 (*)    All other components within normal limits   EKG None  Radiology No results found.  Procedures Procedures   Medications Ordered in ED Medications  0.45 % sodium chloride infusion ( Intravenous New Bag/Given 11/30/21 1401)  morphine 4 MG/ML injection 4 mg (4 mg Intravenous Given 11/30/21 1405)   ED Course  I have reviewed the triage vital signs and the nursing notes.  Pertinent labs & imaging results that were available during my care of the patient were reviewed by me and considered in my medical decision making (see chart for details).  Clinical Course as of 11/30/21 1456  Wed Nov 30, 2021  1451 CBC with Differential(!) Hemoglobin appears stable compared to prior values. [LJ]    Clinical Course User Index [LJ] Rayna Sexton, PA-C   MDM Rules/Calculators/A&P                          Pt is a 50 y.o. female who presents to the emergency department for reevaluation of right leg pain.  Patient states that she has a knee injury and since this occurred she has been experiencing spasms in the right leg.  She has been taking baclofen for this.  Labs: CBC with a white count of 10.6, hemoglobin of 9.4, hematocrit of 25, MCHC of 37.6, and RBCs of 0.8. CMP with potassium of 3.4, glucose of 101, BUN of 23, AST of 44, total bilirubin of 1.4. Reticulocyte count percentage of 7.7, absolute reticulocyte count of 217.7, immature reticulocytes 36.  I, Rayna Sexton, PA-C, personally reviewed and evaluated these images and lab results as part of my medical decision-making.  On my exam patient has mild tenderness diffusely in the right lower extremity.  It appears to be worst along the right hip and  right knee.  Full range of motion of the joints of the right leg.  No pedal edema.  Neurovascular intact in the right lower extremity.  No new falls or injuries.  Basic labs were obtained and are generally reassuring.  Appears similar to patient's baseline.  Hemoglobin appears stable at 9.4.  Patient symptoms treated with IV fluids as well as morphine.  She notes moderate improvement in her pain.  Feel the patient is stable for discharge at this time and she is agreeable.  She has a ride home.  Patient is ambulatory with a cane.  Recommended follow-up with her PCP as well as neurology.  Discussed return precautions.  Her questions were answered and she was amicable at the time of discharge.  Note: Portions of this report may have been transcribed using voice recognition software. Every effort was made to ensure accuracy; however, inadvertent computerized transcription errors may be present.   Final Clinical Impression(s) / ED Diagnoses Final diagnoses:  Muscle spasm of right leg   Rx / DC Orders ED Discharge Orders     None        Rayna Sexton, PA-C 11/30/21 1504    Brittany Muskrat, MD 12/01/21 1312

## 2021-11-30 NOTE — Patient Instructions (Signed)
Muscle Cramps and Spasms Muscle cramps and spasms are when muscles tighten by themselves. They usually get better within minutes. Muscle cramps are painful. They are usually stronger and last longer than muscle spasms. Muscle spasms may or may not be painful. They can last a few seconds or much longer. Cramps and spasms can affect any muscle, but they occur most often in the calf muscles of the leg. They are usually not caused by a serious problem. In many cases, the cause is not known. Some common causes include: Doing more physical work or exercise than your body is ready for. Using the muscles too much (overuse) by repeating certain movements too many times. Staying in a certain position for a long time. Playing a sport or doing an activity without preparing properly. Using bad form or technique while playing a sport or doing an activity. Not having enough water in your body (dehydration). Injury. Side effects of some medicines. Low levels of the salts and minerals in your blood (electrolytes), such as low potassium or calcium. Follow these instructions at home: Managing pain and stiffness   Massage, stretch, and relax the muscle. Do this for many minutes at a time. If told, put heat on tight or tense muscles as often as told by your doctor. Use the heat source that your doctor recommends, such as a moist heat pack or a heating pad. Place a towel between your skin and the heat source. Leave the heat on for 20-30 minutes. Remove the heat if your skin turns bright red. This is very important if you are not able to feel pain, heat, or cold. You may have a greater risk of getting burned. If told, put ice on the affected area. This may help if you are sore or have pain after a cramp or spasm. Put ice in a plastic bag. Place a towel between your skin and the bag. Leave the ice on for 20 minutes, 2-3 times a day. Try taking hot showers or baths to help relax tight muscles. Eating and  drinking Drink enough fluid to keep your pee (urine) pale yellow. Eat a healthy diet to help ensure that your muscles work well. This should include: Fruits and vegetables. Lean protein. Whole grains. Low-fat or nonfat dairy products. General instructions If you are having cramps often, avoid intense exercise for several days. Take over-the-counter and prescription medicines only as told by your doctor. Watch for any changes in your symptoms. Keep all follow-up visits as told by your doctor. This is important. Contact a doctor if: Your cramps or spasms get worse or happen more often. Your cramps or spasms do not get better with time. Summary Muscle cramps and spasms are when muscles tighten by themselves. They usually get better within minutes. Cramps and spasms occur most often in the calf muscles of the leg. Massage, stretch, and relax the muscle. This may help the cramp or spasm go away. Drink enough fluid to keep your pee (urine) pale yellow. This information is not intended to replace advice given to you by your health care provider. Make sure you discuss any questions you have with your health care provider. Document Revised: 06/17/2021 Document Reviewed: 06/17/2021 Elsevier Patient Education  [place].

## 2021-11-30 NOTE — ED Triage Notes (Signed)
Pt complaining of pain in right side leg, into hip and back.  Pt alert and oriented.  Pt sickle cell pt doesn't have folic acid which she states usually helps.

## 2021-11-30 NOTE — Progress Notes (Signed)
[place], [place]  [address] Phone:  [phone]   Fax:  [phone]   Established Patient Office Visit  Subjective:  Patient ID: Brittany Shannon, female    DOB: [birth year]  Age: 50 y.o. MRN: [id number]  CC:  Chief Complaint  Patient presents with   Follow-up    Pt stated she sprained her knee in May and she has been having a lot of muscle spasm. Pt said she has been taking baclofen. Pt stated she has a appointment for neurologist in February    HPI Ema Pyper presents for muscle pain. She has been lost to follow up. She  has a past medical history of Asthma, Deafness in left ear, Homelessness, Hypertension, and Sickle cell anemia ([place]).   She was recently seen in the ED and was informed that she needed to be seen for her blood. She  is having muscle spasms. She reports been seen by ortho for a twisted knee. She has been followed by PT. She reports waiting for an apt with neurology.   Her blood pressure is elevated.  She has a history of hypertension however is currently not on any treatment. Denies headache, dizziness, visual changes, shortness of breath, dyspnea on exertion, chest pain, nausea, vomiting or any edema.    Past Medical History:  Diagnosis Date   Asthma    Deafness in left ear    Homelessness    Hypertension    Sickle cell anemia (HCC)     Past Surgical History:  Procedure Laterality Date   CHOLECYSTECTOMY     EYE SURGERY     SPLENECTOMY, TOTAL      Family History  Problem Relation Age of Onset   Sickle cell trait Other     Social History   Socioeconomic History   Marital status: Single    Spouse name: Not on file   Number of children: Not on file   Years of education: Not on file   Highest education level: Not on file  Occupational History   Not on file  Tobacco Use   Smoking status: Every Day    Types: Cigarettes   Smokeless tobacco: Never  Substance and Sexual Activity   Alcohol use: No    Drug use: No   Sexual activity: Yes  Other Topics Concern   Not on file  Social History Narrative   Not on file   Social Determinants of Health   Financial Resource Strain: Not on file  Food Insecurity: Not on file  Transportation Needs: Not on file  Physical Activity: Not on file  Stress: Not on file  Social Connections: Not on file  Intimate Partner Violence: Not on file    Outpatient Medications Prior to Visit  Medication Sig Dispense Refill   baclofen (LIORESAL) 10 MG tablet Take 10 mg by mouth 3 (three) times daily.     albuterol (VENTOLIN HFA) 108 (90 Base) MCG/ACT inhaler INHALE 2 PUFFS INTO THE LUNGS EVERY 6 HOURS AS NEEDED FOR WHEEZING (Patient taking differently: 1-2 puffs every 6 (six) hours as needed for wheezing or shortness of breath.) 8.5 g 1   oxyCODONE-acetaminophen (PERCOCET/ROXICET) 5-325 MG tablet Take 1 tablet by mouth every 6 (six) hours as needed for moderate pain.     bismuth subsalicylate (PEPTO BISMOL) 262 MG chewable tablet Chew 524 mg by mouth 3 (three) times daily as needed for indigestion.     Diclofenac Sodium 3 % GEL SMARTSIG:Gram(s) Topical Twice Daily  doxylamine, Sleep, (UNISOM) 25 MG tablet Take 25 mg by mouth at bedtime as needed for sleep. (Patient not taking: Reported on 11/30/2021)     ibuprofen (ADVIL) 200 MG tablet Take 200 mg by mouth every 6 (six) hours as needed for mild pain or moderate pain. (Patient not taking: Reported on 11/30/2021)     No facility-administered medications prior to visit.    No Known Allergies  ROS Review of Systems    Objective:    Physical Exam Constitutional:      Appearance: She is normal weight.  HENT:     Head: Normocephalic and atraumatic.     Nose: Nose normal.  Cardiovascular:     Rate and Rhythm: Normal rate and regular rhythm.  Pulmonary:     Effort: Pulmonary effort is normal.     Breath sounds: Normal breath sounds.  Abdominal:     Palpations: Abdomen is soft.     Comments:  hypoactive  Musculoskeletal:        General: Normal range of motion.     Cervical back: Normal range of motion.  Skin:    General: Skin is warm.     Capillary Refill: Capillary refill takes less than 2 seconds.  Neurological:     General: No focal deficit present.     Mental Status: She is alert and oriented to person, place, and time.     Comments: Kicking legs on the exam table    BP (!) 141/96    Pulse 100    Temp 98.8 F (37.1 C)    Ht 5\' 9"  (1.753 m)    Wt 119 lb (54 kg)    LMP 04/07/2013 Comment: neg preg 11/03/16   SpO2 98%    BMI 17.57 kg/m  Wt Readings from Last 3 Encounters:  11/30/21 119 lb 0.8 oz (54 kg)  11/30/21 119 lb (54 kg)  11/16/21 128 lb (58.1 kg)     There are no preventive care reminders to display for this patient.   There are no preventive care reminders to display for this patient.  No results found for: TSH Lab Results  Component Value Date   WBC 10.6 (H) 11/30/2021   HGB 9.4 (L) 11/30/2021   HCT 25.0 (L) 11/30/2021   MCV 88.7 11/30/2021   PLT 248 11/30/2021   Lab Results  Component Value Date   NA 137 11/30/2021   K 3.4 (L) 11/30/2021   CO2 29 11/30/2021   GLUCOSE 101 (H) 11/30/2021   BUN 23 (H) 11/30/2021   CREATININE 0.63 11/30/2021   BILITOT 1.4 (H) 11/30/2021   ALKPHOS 89 11/30/2021   AST 44 (H) 11/30/2021   ALT 44 11/30/2021   PROT 7.6 11/30/2021   ALBUMIN 3.7 11/30/2021   CALCIUM 9.3 11/30/2021   ANIONGAP 7 11/30/2021   Lab Results  Component Value Date   CHOL 119 04/29/2019   Lab Results  Component Value Date   HDL 37 (L) 04/29/2019   Lab Results  Component Value Date   LDLCALC 70 04/29/2019   Lab Results  Component Value Date   TRIG 62 04/29/2019   Lab Results  Component Value Date   CHOLHDL 3.2 04/29/2019   Lab Results  Component Value Date   HGBA1C 4.4 07/23/2019      Assessment & Plan:   Problem List Items Addressed This Visit       Cardiovascular and Mediastinum   HTN (hypertension) -  Primary Pt declined treatment for hypertension Discussed the dangers of  uncontrolled HTN Will refer patient to follow up with Dr Liane Comber      Other   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   Follow-up: Return for Pt to follow up with new provider  Phone Number [phone] .    Vevelyn Francois, NP

## 2021-12-01 ENCOUNTER — Encounter: Payer: Self-pay | Admitting: Nurse Practitioner

## 2021-12-01 LAB — CMP14+CBC/D/PLT+FER+RETIC+V...
ALT: 41 IU/L — ABNORMAL HIGH (ref 0–32)
AST: 49 IU/L — ABNORMAL HIGH (ref 0–40)
Albumin/Globulin Ratio: 1.1 — ABNORMAL LOW (ref 1.2–2.2)
Albumin: 4 g/dL (ref 3.8–4.9)
Alkaline Phosphatase: 102 IU/L (ref 44–121)
BUN/Creatinine Ratio: 22 (ref 9–23)
BUN: 17 mg/dL (ref 6–24)
Basophils Absolute: 0.1 10*3/uL (ref 0.0–0.2)
Basos: 1 %
Bilirubin Total: 1.3 mg/dL — ABNORMAL HIGH (ref 0.0–1.2)
CO2: 27 mmol/L (ref 20–29)
Calcium: 9.5 mg/dL (ref 8.7–10.2)
Chloride: 97 mmol/L (ref 96–106)
Creatinine, Ser: 0.78 mg/dL (ref 0.57–1.00)
EOS (ABSOLUTE): 0.6 10*3/uL — ABNORMAL HIGH (ref 0.0–0.4)
Eos: 7 %
Ferritin: 902 ng/mL — ABNORMAL HIGH (ref 15–150)
Globulin, Total: 3.5 g/dL (ref 1.5–4.5)
Glucose: 81 mg/dL (ref 70–99)
Hematocrit: 30 % — ABNORMAL LOW (ref 34.0–46.6)
Hemoglobin: 9.7 g/dL — ABNORMAL LOW (ref 11.1–15.9)
Immature Grans (Abs): 0 10*3/uL (ref 0.0–0.1)
Immature Granulocytes: 0 %
Lymphocytes Absolute: 2.4 10*3/uL (ref 0.7–3.1)
Lymphs: 27 %
MCH: 33 pg (ref 26.6–33.0)
MCHC: 32.3 g/dL (ref 31.5–35.7)
MCV: 102 fL — ABNORMAL HIGH (ref 79–97)
Monocytes Absolute: 0.8 10*3/uL (ref 0.1–0.9)
Monocytes: 10 %
NRBC: 1 % — ABNORMAL HIGH (ref 0–0)
Neutrophils Absolute: 4.8 10*3/uL (ref 1.4–7.0)
Neutrophils: 55 %
Platelets: 248 10*3/uL (ref 150–450)
Potassium: 4.1 mmol/L (ref 3.5–5.2)
RBC: 2.94 x10E6/uL — ABNORMAL LOW (ref 3.77–5.28)
RDW: 16.3 % — ABNORMAL HIGH (ref 11.7–15.4)
Retic Ct Pct: 7.1 % — ABNORMAL HIGH (ref 0.6–2.6)
Sodium: 137 mmol/L (ref 134–144)
Total Protein: 7.5 g/dL (ref 6.0–8.5)
Vit D, 25-Hydroxy: 48.8 ng/mL (ref 30.0–100.0)
WBC: 8.7 10*3/uL (ref 3.4–10.8)
eGFR: 92 mL/min/{1.73_m2} (ref >59)

## 2021-12-02 ENCOUNTER — Other Ambulatory Visit: Payer: Self-pay | Admitting: Nurse Practitioner

## 2021-12-02 MED ORDER — OXYCODONE-ACETAMINOPHEN 5-325 MG PO TABS: 1.0000 | ORAL_TABLET | Freq: Four times a day (QID) | ORAL | 0 refills | Status: DC | PRN

## 2021-12-08 ENCOUNTER — Telehealth: Payer: Self-pay | Admitting: Physician Assistant

## 2021-12-08 NOTE — Telephone Encounter (Signed)
Scheduled appt per 12/21 referral. Pt is aware of appt date and time. Pt is aware to arrive 15 mins prior to appt time.

## 2021-12-20 ENCOUNTER — Telehealth: Payer: Self-pay | Admitting: Neurology

## 2021-12-20 ENCOUNTER — Ambulatory Visit (INDEPENDENT_AMBULATORY_CARE_PROVIDER_SITE_OTHER): Payer: Medicare Other | Admitting: Neurology

## 2021-12-20 ENCOUNTER — Telehealth: Payer: Self-pay | Admitting: Hematology

## 2021-12-20 ENCOUNTER — Encounter: Payer: Self-pay | Admitting: Neurology

## 2021-12-20 VITALS — BP 144/88 | HR 81 | Ht 69.0 in | Wt 117.0 lb

## 2021-12-20 DIAGNOSIS — M62838 Other muscle spasm: Secondary | ICD-10-CM | POA: Insufficient documentation

## 2021-12-20 DIAGNOSIS — R269 Unspecified abnormalities of gait and mobility: Secondary | ICD-10-CM

## 2021-12-20 MED ORDER — LAMOTRIGINE 100 MG PO TABS: 100.0000 mg | ORAL_TABLET | Freq: Two times a day (BID) | ORAL | 11 refills | Status: DC

## 2021-12-20 MED ORDER — LAMOTRIGINE 25 MG PO TABS: ORAL_TABLET | ORAL | 0 refills | Status: DC

## 2021-12-20 NOTE — Telephone Encounter (Signed)
[employer] medicare/medicaid order sent to [facility], [facility] they will reach out to the patient to schedule.

## 2021-12-20 NOTE — Patient Instructions (Signed)
[facility]    Address: [address], [place], [place] [address]  Phone: [phone]   

## 2021-12-20 NOTE — Telephone Encounter (Signed)
R/s pt's new hem appt per Cassie's secure chat. Pt is aware of new appt date and time.

## 2021-12-20 NOTE — Progress Notes (Deleted)
[facility] [phone]   Fax:([phone]  CONSULT NOTE  REFERRING PHYSICIAN:  REASON FOR CONSULTATION:  ***  HPI Brittany Shannon is a 51 y.o. female.  *** HPI  Past Medical History:  Diagnosis Date   Asthma    Deafness in left ear    Homelessness    Hypertension    Sickle cell anemia (HCC)     Past Surgical History:  Procedure Laterality Date   CHOLECYSTECTOMY     EYE SURGERY     SPLENECTOMY, TOTAL      Family History  Problem Relation Age of Onset   Sickle cell trait Other     Social History Social History   Tobacco Use   Smoking status: Every Day    Types: Cigarettes   Smokeless tobacco: Never  Substance Use Topics   Alcohol use: No   Drug use: No    No Known Allergies  Current Outpatient Medications  Medication Sig Dispense Refill   albuterol (VENTOLIN HFA) 108 (90 Base) MCG/ACT inhaler INHALE 2 PUFFS INTO THE LUNGS EVERY 6 HOURS AS NEEDED FOR WHEEZING (Patient taking differently: 1-2 puffs every 6 (six) hours as needed for wheezing or shortness of breath.) 8.5 g 1   baclofen (LIORESAL) 10 MG tablet Take 10 mg by mouth 3 (three) times daily.     lamoTRIgine (LAMICTAL) 100 MG tablet Take 1 tablet (100 mg total) by mouth 2 (two) times daily. 60 tablet 11   lamoTRIgine (LAMICTAL) 25 MG tablet 1 tab bid x one week 2 tab bid x 2nd week 3 tab bid x 3rd week 84 tablet 0   oxyCODONE-acetaminophen (PERCOCET/ROXICET) 5-325 MG tablet Take 1 tablet by mouth every 6 (six) hours as needed for moderate pain. 60 tablet 0   No current facility-administered medications for this visit.    REVIEW OF SYSTEMS:   Review of Systems  Constitutional: Negative for appetite change, chills, fatigue, fever and unexpected weight change.  HENT:   Negative for mouth sores, nosebleeds, sore throat and trouble swallowing.   Eyes: Negative for eye problems and icterus.  Respiratory: Negative for cough, hemoptysis, shortness of breath and wheezing.    Cardiovascular: Negative for chest pain and leg swelling.  Gastrointestinal: Negative for abdominal pain, constipation, diarrhea, nausea and vomiting.  Genitourinary: Negative for bladder incontinence, difficulty urinating, dysuria, frequency and hematuria.   Musculoskeletal: Negative for back pain, gait problem, neck pain and neck stiffness.  Skin: Negative for itching and rash.  Neurological: Negative for dizziness, extremity weakness, gait problem, headaches, light-headedness and seizures.  Hematological: Negative for adenopathy. Does not bruise/bleed easily.  Psychiatric/Behavioral: Negative for confusion, depression and sleep disturbance. The patient is not nervous/anxious.     PHYSICAL EXAMINATION:  Last menstrual period 04/07/2013.  ECOG PERFORMANCE STATUS: {CHL ONC ECOG [id number]  Physical Exam  Constitutional: Oriented to person, place, and time and well-developed, well-nourished, and in no distress. No distress.  HENT:  Head: Normocephalic and atraumatic.  Mouth/Throat: Oropharynx is clear and moist. No oropharyngeal exudate.  Eyes: Conjunctivae are normal. Right eye exhibits no discharge. Left eye exhibits no discharge. No scleral icterus.  Neck: Normal range of motion. Neck supple.  Cardiovascular: Normal rate, regular rhythm, normal heart sounds and intact distal pulses.   Pulmonary/Chest: Effort normal and breath sounds normal. No respiratory distress. No wheezes. No rales.  Abdominal: Soft. Bowel sounds are normal. Exhibits no distension and no mass. There is no tenderness.  Musculoskeletal: Normal range of motion. Exhibits no edema.  Lymphadenopathy:    No cervical adenopathy.  Neurological: Alert and oriented to person, place, and time. Exhibits normal muscle tone. Gait normal. Coordination normal.  Skin: Skin is warm and dry. No rash noted. Not diaphoretic. No erythema. No pallor.  Psychiatric: Mood, memory and judgment normal.  Vitals reviewed.  LABORATORY  DATA: Lab Results  Component Value Date   WBC 10.6 (H) 11/30/2021   HGB 9.4 (L) 11/30/2021   HCT 25.0 (L) 11/30/2021   MCV 88.7 11/30/2021   PLT 248 11/30/2021      Chemistry      Component Value Date/Time   NA 137 11/30/2021 1337   NA 137 11/30/2021 1153   NA 138 02/13/2012 2057   K 3.4 (L) 11/30/2021 1337   K 4.3 02/13/2012 2057   CL 101 11/30/2021 1337   CL 104 02/13/2012 2057   CO2 29 11/30/2021 1337   CO2 27 02/13/2012 2057   BUN 23 (H) 11/30/2021 1337   BUN 17 11/30/2021 1153   BUN 8 02/13/2012 2057   CREATININE 0.63 11/30/2021 1337   CREATININE 0.59 (L) 02/13/2012 2057      Component Value Date/Time   CALCIUM 9.3 11/30/2021 1337   CALCIUM 9.4 02/13/2012 2057   ALKPHOS 89 11/30/2021 1337   ALKPHOS 56 02/13/2012 2057   AST 44 (H) 11/30/2021 1337   AST 41 (H) 02/13/2012 2057   ALT 44 11/30/2021 1337   ALT 20 02/13/2012 2057   BILITOT 1.4 (H) 11/30/2021 1337   BILITOT 1.3 (H) 11/30/2021 1153   BILITOT 4.3 (H) 02/13/2012 2057       RADIOGRAPHIC STUDIES: No results found.  ASSESSMENT:   PLAN:  The patient voices understanding of current disease status and treatment options and is in agreement with the current care plan.  All questions were answered. The patient knows to call the clinic with any problems, questions or concerns. We can certainly see the patient much sooner if necessary.  Thank you so much for allowing me to participate in the care of Brittany Shannon. I will continue to follow up the patient with you and assist in her care.  I spent {CHL ONC TIME VISIT - RWERX:[id number]} counseling the patient face to face. The total time spent in the appointment was {CHL ONC TIME VISIT - JKDTO:[id number]}.  Disclaimer: This note was dictated with voice recognition software. Similar sounding words can inadvertently be transcribed and may not be corrected upon review.   Acie Custis L Jamel Holzmann December 20, 2021, 11:32 AM

## 2021-12-20 NOTE — Progress Notes (Signed)
Chief Complaint  Patient presents with   New Patient (Initial Visit)    Rm 13 NP/internal ED referral for tremors States tremors are worse in legs reports spasms in right foot, states spasms are very painful, sx started after a fall at work, unable to sit for long periods of time.       ASSESSMENT AND PLAN  Brittany Shannon is a 51 y.o. female   Right lower extremities myoclonic jerking movement,  Hyperreflexia of bilateral lower extremity, right worse than left, bilateral Babinski signs,  Need to rule out central nervous system involvement, CT head with without contrast, CT cervical and thoracic spine  Try lamotrigine titrating to 100 mg twice daily for symptomatic control  Right hip pain,  X-ray of right hip,  Not a candidate for MRI due to right cochlear implant    DIAGNOSTIC DATA (LABS, IMAGING, TESTING) - I reviewed patient records, labs, notes, testing and imaging myself where available.   MEDICAL HISTORY:  Brittany Shannon, is a 51 year old female, seen in request by her primary care nurse practitioner Vevelyn Francois, for evaluation of right lower extremity spasm, initial evaluation was on December 20, 2021  I reviewed and summarized the referring note. 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 difficulty, work as a Glass blower/designer  Right knee pain has some improvement, but still reported moderate pain, since then she had frequent uncontrollable right lower extremity muscle jerking, I was able to witness few episode, mimic right lower extremity myoclonic activity, she has mild right lower extremity weakness, reported mainly due to her right knee pain,  She has no control of her right lower extremity muscles  spasm, jerking movement, is usually triggered by right knee extension, it can last for few minutes or even longer, make her very tired, she does has asymmetric lower extremity deep tendon reflex, right lower extremity was hyperreflexia  She denies left lower extremity involvement, denies spreading to right upper extremity, she also complains of right hip pain  She has sensorineural loss hearing loss, status post right cochlear implant, not MRIs candidate  PHYSICAL EXAM:   Vitals:   12/20/21 0807  BP: (!) 144/88  Pulse: 81  Weight: 117 lb (53.1 kg)  Height: 5\' 9"  (1.753 m)   Not recorded     Body mass index is 17.28 kg/m.  PHYSICAL EXAMNIATION:  Gen: NAD, conversant, well nourised, well groomed                     Cardiovascular: Regular rate rhythm, no peripheral edema, warm, nontender. Eyes: Conjunctivae clear without exudates or hemorrhage Neck: Supple, no carotid bruits. Pulmonary: Clear to auscultation bilaterally   NEUROLOGICAL EXAM:  MENTAL STATUS: Speech:    Speech is normal; fluent and spontaneous with normal comprehension.  Cognition:     Orientation to time, place and person     Normal recent and remote memory     Normal Attention span and concentration     Normal Language, naming, repeating,spontaneous speech     Fund of knowledge   CRANIAL NERVES: CN II: Visual fields are full to confrontation. Pupils are round equal and briskly reactive to light. CN III, IV, VI: extraocular movement  are normal. No ptosis. CN V: Facial sensation is intact to light touch CN VII: Face is symmetric with normal eye closure  CN VIII: Decreased hearing on casual conversation. CN IX, X: Phonation is normal. CN XI: Head turning and shoulder shrug are intact  MOTOR: Bilateral upper extremity and left lower extremity motor strength is normal, right lower extremity motor strength is limited by right knee pain,  REFLEXES: Reflexes are 2+ and symmetric at the biceps, triceps, 3  R/L2+ knees, and present at ankle ankles. Plantar responses are extensor bilaterally  SENSORY: Intact to light touch, pinprick and vibratory sensation are intact in fingers and toes.  COORDINATION: There is no trunk or limb dysmetria noted.  GAIT/STANCE: She needs push-up to get up from seated position, antalgic, variable effort on examinations  REVIEW OF SYSTEMS:  Full 14 system review of systems performed and notable only for as above All other review of systems were negative.   ALLERGIES: No Known Allergies  HOME MEDICATIONS: Current Outpatient Medications  Medication Sig Dispense Refill   albuterol (VENTOLIN HFA) 108 (90 Base) MCG/ACT inhaler INHALE 2 PUFFS INTO THE LUNGS EVERY 6 HOURS AS NEEDED FOR WHEEZING (Patient taking differently: 1-2 puffs every 6 (six) hours as needed for wheezing or shortness of breath.) 8.5 g 1   baclofen (LIORESAL) 10 MG tablet Take 10 mg by mouth 3 (three) times daily.     oxyCODONE-acetaminophen (PERCOCET/ROXICET) 5-325 MG tablet Take 1 tablet by mouth every 6 (six) hours as needed for moderate pain. 60 tablet 0   No current facility-administered medications for this visit.    PAST MEDICAL HISTORY: Past Medical History:  Diagnosis Date   Asthma    Deafness in left ear    Homelessness    Hypertension    Sickle cell anemia ([place])     PAST SURGICAL HISTORY: Past Surgical History:  Procedure Laterality Date   CHOLECYSTECTOMY     EYE SURGERY     SPLENECTOMY, TOTAL      FAMILY HISTORY: Family History  Problem Relation Age of Onset   Sickle cell trait Other     SOCIAL HISTORY: Social History   Socioeconomic History   Marital status: Single    Spouse name: Not on file   Number of children: Not on file   Years of education: Not on file   Highest education level: Not on file  Occupational History   Not on file  Tobacco Use   Smoking status: Every Day    Types: Cigarettes   Smokeless tobacco: Never  Substance and Sexual Activity    Alcohol use: No   Drug use: No   Sexual activity: Yes  Other Topics Concern   Not on file  Social History Narrative   Not on file   Social Determinants of Health   Financial Resource Strain: Not on file  Food Insecurity: Not on file  Transportation Needs: Not on file  Physical Activity: Not on file  Stress: Not on file  Social Connections: Not on file  Intimate Partner Violence: Not on file      Marcial Pacas, M.D. Ph.D.  [facility] [address] [place], [place] [address] Ph: [phone] Fax: [phone]  CC:  Vevelyn Francois, NP [address] [place],  [place] [address]  Vevelyn Francois, NP

## 2021-12-21 ENCOUNTER — Inpatient Hospital Stay: Payer: Commercial Managed Care - HMO | Admitting: Physician Assistant

## 2021-12-21 ENCOUNTER — Inpatient Hospital Stay: Payer: Commercial Managed Care - HMO

## 2021-12-21 ENCOUNTER — Other Ambulatory Visit: Payer: Self-pay

## 2021-12-21 ENCOUNTER — Ambulatory Visit
Admission: RE | Admit: 2021-12-21 | Discharge: 2021-12-21 | Disposition: A | Discharge status: Home / Self Care | Payer: Commercial Managed Care - HMO | Source: Ambulatory Visit | Attending: Neurology | Admitting: Neurology

## 2021-12-21 DIAGNOSIS — M62838 Other muscle spasm: Secondary | ICD-10-CM

## 2021-12-21 DIAGNOSIS — R269 Unspecified abnormalities of gait and mobility: Secondary | ICD-10-CM

## 2021-12-24 ENCOUNTER — Other Ambulatory Visit: Payer: Self-pay | Admitting: Psychiatry

## 2021-12-24 LAB — VITAMIN B12: Vitamin B-12: 1108 pg/mL (ref 232–1245)

## 2021-12-24 LAB — TSH: TSH: 1.23 u[IU]/mL (ref 0.450–4.500)

## 2021-12-24 LAB — RPR: RPR Ser Ql: NONREACTIVE

## 2021-12-24 LAB — CK: Total CK: 64 U/L (ref 32–182)

## 2021-12-24 LAB — HIV ANTIBODY (ROUTINE TESTING W REFLEX): HIV Screen 4th Generation wRfx: NONREACTIVE

## 2021-12-24 LAB — FERRITIN: Ferritin: 643 ng/mL — ABNORMAL HIGH (ref 15–150)

## 2021-12-24 LAB — COPPER, SERUM: Copper: 109 ug/dL (ref 80–158)

## 2021-12-24 MED ORDER — BISACODYL 5 MG PO TBEC: 5.0000 mg | DELAYED_RELEASE_TABLET | Freq: Every day | ORAL | 0 refills | Status: DC | PRN

## 2021-12-24 MED ORDER — BISACODYL 5 MG PO TBEC: 5.0000 mg | DELAYED_RELEASE_TABLET | Freq: Every day | ORAL | 0 refills | Status: AC | PRN

## 2021-12-26 ENCOUNTER — Telehealth: Payer: Self-pay | Admitting: Neurology

## 2021-12-26 NOTE — Telephone Encounter (Signed)
Pt started lamoTRIgine (LAMICTAL) 100 MG tablet taking last Monday 12/19/21. having constipation with a stomach ache on 12/22/21. Have taken some laxative, feel better now. Have stop taking the medication. Would like a call from the nurse to discuss if need to continue taking Lamotrigine or can she prescribe something else.

## 2021-12-26 NOTE — Telephone Encounter (Signed)
I spoke to the patient. She only tried lamotrigine 25mg , one tab BID for four days. She felt like the medication was already helping her symptoms. However, she started experiencing constipation and this worried her so she stopped it. Unsure if this is a side effect of the medication. She is going to restart the titration over with the 25mg  tablets. She will call us back with any concerns. She is aware to stay well hydrated and maintain a healthy amount of fiber in her diet. She also understands to contact us right away for any development of a rash.

## 2021-12-29 ENCOUNTER — Other Ambulatory Visit: Payer: Commercial Managed Care - HMO

## 2022-01-03 ENCOUNTER — Inpatient Hospital Stay: Payer: Medicare Other

## 2022-01-03 ENCOUNTER — Other Ambulatory Visit: Payer: Self-pay

## 2022-01-03 ENCOUNTER — Inpatient Hospital Stay: Payer: Medicare Other | Attending: Physician Assistant | Admitting: Hematology

## 2022-01-03 VITALS — BP 140/98 | HR 86 | Temp 98.1°F | Resp 18 | Wt 118.0 lb

## 2022-01-03 DIAGNOSIS — F17210 Nicotine dependence, cigarettes, uncomplicated: Secondary | ICD-10-CM | POA: Insufficient documentation

## 2022-01-03 DIAGNOSIS — I10 Essential (primary) hypertension: Secondary | ICD-10-CM | POA: Insufficient documentation

## 2022-01-03 DIAGNOSIS — D72829 Elevated white blood cell count, unspecified: Secondary | ICD-10-CM | POA: Diagnosis present

## 2022-01-03 DIAGNOSIS — R7989 Other specified abnormal findings of blood chemistry: Secondary | ICD-10-CM

## 2022-01-03 LAB — CBC WITH DIFFERENTIAL/PLATELET
Abs Immature Granulocytes: 0.01 10*3/uL (ref 0.00–0.07)
Basophils Absolute: 0.1 10*3/uL (ref 0.0–0.1)
Basophils Relative: 2 %
Eosinophils Absolute: 0.1 10*3/uL (ref 0.0–0.5)
Eosinophils Relative: 2 %
HCT: 29.6 % — ABNORMAL LOW (ref 36.0–46.0)
Hemoglobin: 11.2 g/dL — ABNORMAL LOW (ref 12.0–15.0)
Immature Granulocytes: 0 %
Lymphocytes Relative: 26 %
Lymphs Abs: 1.9 10*3/uL (ref 0.7–4.0)
MCH: 32.2 pg (ref 26.0–34.0)
MCHC: 37.8 g/dL — ABNORMAL HIGH (ref 30.0–36.0)
MCV: 85.1 fL (ref 80.0–100.0)
Monocytes Absolute: 0.8 10*3/uL (ref 0.1–1.0)
Monocytes Relative: 11 %
Neutro Abs: 4.2 10*3/uL (ref 1.7–7.7)
Neutrophils Relative %: 59 %
Platelets: 297 10*3/uL (ref 150–400)
RBC: 3.48 MIL/uL — ABNORMAL LOW (ref 3.87–5.11)
RDW: 15.8 % — ABNORMAL HIGH (ref 11.5–15.5)
WBC: 7.2 10*3/uL (ref 4.0–10.5)
nRBC: 1.3 % — ABNORMAL HIGH (ref 0.0–0.2)

## 2022-01-03 LAB — SEDIMENTATION RATE: Sed Rate: 6 mm/hr (ref 0–22)

## 2022-01-03 LAB — RETICULOCYTES
Immature Retic Fract: 25.6 % — ABNORMAL HIGH (ref 2.3–15.9)
RBC.: 3.45 MIL/uL — ABNORMAL LOW (ref 3.87–5.11)
Retic Count, Absolute: 164.9 10*3/uL (ref 19.0–186.0)
Retic Ct Pct: 4.8 % — ABNORMAL HIGH (ref 0.4–3.1)

## 2022-01-03 LAB — CMP (CANCER CENTER ONLY)
ALT: 18 U/L (ref 0–44)
AST: 28 U/L (ref 15–41)
Albumin: 4.5 g/dL (ref 3.5–5.0)
Alkaline Phosphatase: 69 U/L (ref 38–126)
Anion gap: 6 (ref 5–15)
BUN: 12 mg/dL (ref 6–20)
CO2: 26 mmol/L (ref 22–32)
Calcium: 9.9 mg/dL (ref 8.9–10.3)
Chloride: 105 mmol/L (ref 98–111)
Creatinine: 0.86 mg/dL (ref 0.44–1.00)
GFR, Estimated: >60 mL/min (ref >60)
Glucose, Bld: 93 mg/dL (ref 70–99)
Potassium: 3.3 mmol/L — ABNORMAL LOW (ref 3.5–5.1)
Sodium: 137 mmol/L (ref 135–145)
Total Bilirubin: 2.4 mg/dL — ABNORMAL HIGH (ref 0.3–1.2)
Total Protein: 8.2 g/dL — ABNORMAL HIGH (ref 6.5–8.1)

## 2022-01-03 LAB — LACTATE DEHYDROGENASE: LDH: 253 U/L — ABNORMAL HIGH (ref 98–192)

## 2022-01-03 NOTE — Progress Notes (Addendum)
[name]   HEMATOLOGY/ONCOLOGY CONSULTATION NOTE  Date of Service: 01/03/2022  Patient Care Team: Vevelyn Francois, NP as PCP - General (Adult Health Nurse Practitioner) Sueanne Margarita, DO as Consulting Physician (Internal Medicine)  CHIEF COMPLAINTS/PURPOSE OF CONSULTATION:  Leukocytosis  HISTORY OF PRESENTING ILLNESS:   Brittany Shannon is a wonderful 51 y.o. female who has been referred to [place] by Dr .Edison Pace, Diona Foley, NP or evaluation and management of leukocytosis.  Patient has a history of hypertension, hemoglobin [place] disease (followed previously at [facility], lost to follow-up, last seen few years ago, was supposed to be on hydroxyurea but is not, intermittent pain crises, history of retinopathy, no history of previous AVN, stroke, heart attack, acute chest syndrome), deafness in left ear, asthma.  Patient was referred to [place] by her primary care physician for leukocytosis.  Review of labs suggest intermittent leukocytosis with WBC counts ranging from 10.6-15.5k over the last year.  This typically has been in the context of increased neutrophils and nucleated red blood cells likely being read by the [employer] counter as WBCs.  Patient notes intermittent pain crises.  Has had difficulties with follow-up with her sickle cell clinic due to social issues and transportation issues. Notes no recent history of infections. Has been getting her sickle cell cares with her primary care physician. Is not very aware of her hemoglobin [place] disease related health considerations. She is an everyday smoker and smokes up to 1 pack/day.  Denies any other drug use. No abdominal pain or distention. No acute chest pain or shortness of breath.  No fevers no chills no night sweats no unexpected weight loss.  MEDICAL HISTORY:  Past Medical History:  Diagnosis Date   Asthma    Deafness in left ear    Homelessness    Hypertension    Sickle cell anemia (HCC)   Hb [place] disease  SURGICAL HISTORY: Past Surgical  History:  Procedure Laterality Date   CHOLECYSTECTOMY     EYE SURGERY     SPLENECTOMY, TOTAL      SOCIAL HISTORY: Social History   Socioeconomic History   Marital status: Single    Spouse name: Not on file   Number of children: Not on file   Years of education: Not on file   Highest education level: Not on file  Occupational History   Not on file  Tobacco Use   Smoking status: Every Day    Types: Cigarettes   Smokeless tobacco: Never  Substance and Sexual Activity   Alcohol use: No   Drug use: No   Sexual activity: Yes  Other Topics Concern   Not on file  Social History Narrative   Not on file   Social Determinants of Health   Financial Resource Strain: Not on file  Food Insecurity: Not on file  Transportation Needs: Not on file  Physical Activity: Not on file  Stress: Not on file  Social Connections: Not on file  Intimate Partner Violence: Not on file    FAMILY HISTORY: Family History  Problem Relation Age of Onset   Sickle cell trait Other     ALLERGIES:  has No Known Allergies.  MEDICATIONS:  Current Outpatient Medications  Medication Sig Dispense Refill   albuterol (VENTOLIN HFA) 108 (90 Base) MCG/ACT inhaler INHALE 2 PUFFS INTO THE LUNGS EVERY 6 HOURS AS NEEDED FOR WHEEZING (Patient taking differently: 1-2 puffs every 6 (six) hours as needed for wheezing or shortness of breath.) 8.5 g 1   baclofen (LIORESAL) 10  MG tablet Take 10 mg by mouth 3 (three) times daily.     bisacodyl (DULCOLAX) 5 MG EC tablet Take 1 tablet (5 mg total) by mouth daily as needed for moderate constipation. 30 tablet 0   lamoTRIgine (LAMICTAL) 100 MG tablet Take 1 tablet (100 mg total) by mouth 2 (two) times daily. 60 tablet 11   lamoTRIgine (LAMICTAL) 25 MG tablet 1 tab bid x one week 2 tab bid x 2nd week 3 tab bid x 3rd week 84 tablet 0   oxyCODONE-acetaminophen (PERCOCET/ROXICET) 5-325 MG tablet Take 1 tablet by mouth every 6 (six) hours as needed for moderate pain. 60 tablet  0   No current facility-administered medications for this visit.    REVIEW OF SYSTEMS:    10 Point review of Systems was done is negative except as noted above.  PHYSICAL EXAMINATION: ECOG PERFORMANCE STATUS: 2  . Vitals:   01/03/22 1255  BP: (!) 140/98  Pulse: 86  Resp: 18  Temp: 98.1 F (36.7 C)  SpO2: 97%   Filed Weights   01/03/22 1255  Weight: 118 lb (53.5 kg)   .Body mass index is 17.43 kg/m. [name] GENERAL:alert, in no acute distress and comfortable SKIN: no acute rashes, no significant lesions EYES: conjunctiva are pink and non-injected, sclera anicteric OROPHARYNX: MMM, no exudates, no oropharyngeal erythema or ulceration NECK: supple, no JVD LYMPH:  no palpable lymphadenopathy in the cervical, axillary or inguinal regions LUNGS: clear to auscultation b/l with normal respiratory effort HEART: regular rate & rhythm ABDOMEN:  normoactive bowel sounds , non tender, not distended. Extremity: no pedal edema PSYCH: alert & oriented x 3 with fluent speech NEURO: no focal motor/sensory deficits   LABORATORY DATA:  I have reviewed the data as listed  . CBC Latest Ref Rng & Units 01/03/2022 11/30/2021 11/30/2021  WBC 4.0 - 10.5 K/uL 7.2 10.6(H) 8.7  Hemoglobin 12.0 - 15.0 g/dL 11.2(L) 9.4(L) 9.7(L)  Hematocrit 36.0 - 46.0 % 29.6(L) 25.0(L) 30.0(L)  Platelets 150 - 400 K/uL 297 248 248    . CMP Latest Ref Rng & Units 01/03/2022 11/30/2021 11/30/2021  Glucose 70 - 99 mg/dL 93 101(H) 81  BUN 6 - 20 mg/dL 12 23(H) 17  Creatinine 0.44 - 1.00 mg/dL 0.86 0.63 0.78  Sodium 135 - 145 mmol/L 137 137 137  Potassium 3.5 - 5.1 mmol/L 3.3(L) 3.4(L) 4.1  Chloride 98 - 111 mmol/L 105 101 97  CO2 22 - 32 mmol/L 26 29 27   Calcium 8.9 - 10.3 mg/dL 9.9 9.3 9.5  Total Protein 6.5 - 8.1 g/dL 8.2(H) 7.6 7.5  Total Bilirubin 0.3 - 1.2 mg/dL 2.4(H) 1.4(H) 1.3(H)  Alkaline Phos 38 - 126 U/L 69 89 102  AST 15 - 41 U/L 28 44(H) 49(H)  ALT 0 - 44 U/L 18 44 41(H)     Component      Latest Ref Rng & Units 01/03/2022  Retic Ct Pct     0.4 - 3.1 % 4.8 (H)  RBC.     3.87 - 5.11 MIL/uL 3.45 (L)  Retic Count, Absolute     19.0 - 186.0 K/uL 164.9  Immature Retic Fract     2.3 - 15.9 % 25.6 (H)  Sed Rate     0 - 22 mm/hr 6  Haptoglobin     33 - 346 mg/dL <10 (L)  LDH     98 - 192 U/L 253 (H)     RADIOGRAPHIC STUDIES: I have personally reviewed the radiological images as  listed and agreed with the findings in the report. DG HIP UNILAT WITH PELVIS 2-3 VIEWS RIGHT  Result Date: 12/21/2021 CLINICAL DATA:  Right hip strain. EXAM: DG HIP (WITH OR WITHOUT PELVIS) 2-3V RIGHT COMPARISON:  None. FINDINGS: There is no evidence of hip fracture or dislocation. There is no evidence of arthropathy or other focal bone abnormality. IMPRESSION: Negative. Electronically Signed   By: Ronney Asters M.D.   On: 12/21/2021 21:07    ASSESSMENT & PLAN:   51 year old lady with a history of hypertension, dyslipidemia, hemoglobin [place] disease referred for evaluation of  1) leukocytosis This appears to be intermittent and chronic over more than 1 year. 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  2) hemoglobin [place] disease previously followed by [facility]. Baseline hemoglobin apparently around 9. Lab evidence of with increased LDH, low haptoglobin, reticulocytosis and increased bilirubin level. Patient does have intermittent pain crises. Does have a history of retinopathy related to this. Was previously recommended hydroxyurea but has not been taking this.  PLAN -Lab work-up for leukocytosis was done.  Repeat labs today show no leukocytosis with normal WBC count hemoglobin is 11.2 with normal platelets. -BCR ABL FISH panel negative ruling out CML -I discussed with the patient that her intermittent  leukocytosis is likely reactive from smoking, inflammation hemolysis and pain crisis from her hemoglobin [place] disease and her increased nucleated red blood cells likely being read as WBC is by the [employer] counter. -No indication for additional work-up for the patient's leukocytosis at this time. -Our hematology practice does not take over care for sickle cell disease as a policy. -Would recommend patient be referred to [facility] for ongoing management of her hemoglobin [place] disease in tandem with her primary care physician. -She will need to be considered for hydroxyurea for her hemoglobin  disease, appropriate vaccinations, pain management other extensive healthcare maintenance strategies. -She would also need to be on folic acid 2 mg p.o. 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 -Patient was counseled in detail about importance of maintaining her medication and follow-up compliance. -We shall see her back as needed if any other questions or concerns arise. All of the patients questions were answered with apparent satisfaction. The patient knows to call the clinic with any problems, questions or concerns.  3) elevated ferritin level with ferritin of 600s to 900s -Likely due to hemolysis cannot rule out iron overload due to ineffective erythropoiesis. -hereditary Hemochromatosis genetic testing done and was found to be negative -Continue follow-up with PCP . Orders Placed This Encounter  Procedures   CBC with Differential/Platelet    Standing Status:   Future    Number of Occurrences:   1    Standing Expiration Date:   01/03/2023   CMP ([place] only)    Standing Status:   Future    Number of Occurrences:   1    Standing Expiration Date:   01/03/2023   Lactate dehydrogenase    Standing Status:   Future    Number of Occurrences:   1    Standing Expiration Date:   01/03/2023   Haptoglobin    Standing Status:   Future     Number of Occurrences:   1    Standing Expiration Date:   01/03/2023   Reticulocytes  Standing Status:   Future    Number of Occurrences:   1    Standing Expiration Date:   01/03/2023   Hemochromatosis DNA, PCR    Standing Status:   Future    Number of Occurrences:   1    Standing Expiration Date:   01/03/2023   BCR-ABL    With RT-PCR technique    Standing Status:   Future    Number of Occurrences:   1    Standing Expiration Date:   01/03/2023   Sedimentation rate    Standing Status:   Future    Number of Occurrences:   1    Standing Expiration Date:   01/03/2023     I spent 35 mins counseling the patient face to face. The total time spent in the appointment was 50 mins including extensive chart review, discussion about her hemoglobin [place] disease, elevated iron, ordering and evaluation of labs and  documentation    Sullivan Lone MD [place] AAHIVMS [facility] Hematology/Oncology Physician [place]  01/03/2022 1:12 PM

## 2022-01-04 ENCOUNTER — Other Ambulatory Visit: Payer: Self-pay

## 2022-01-04 ENCOUNTER — Telehealth: Payer: Self-pay | Admitting: Hematology

## 2022-01-04 ENCOUNTER — Telehealth: Payer: Self-pay

## 2022-01-04 DIAGNOSIS — D72829 Elevated white blood cell count, unspecified: Secondary | ICD-10-CM

## 2022-01-04 LAB — HAPTOGLOBIN: Haptoglobin: <10 mg/dL — ABNORMAL LOW (ref 33–346)

## 2022-01-04 NOTE — Telephone Encounter (Signed)
Oxy contin

## 2022-01-04 NOTE — Telephone Encounter (Signed)
Scheduled follow-up appointment per 1/24 los. Patient is aware.

## 2022-01-06 ENCOUNTER — Other Ambulatory Visit: Payer: Self-pay | Admitting: Nurse Practitioner

## 2022-01-06 DIAGNOSIS — D5720 Sickle-cell/Hb-C disease without crisis: Secondary | ICD-10-CM

## 2022-01-06 LAB — HEMOCHROMATOSIS DNA-PCR(C282Y,H63D)

## 2022-01-06 MED ORDER — OXYCODONE-ACETAMINOPHEN 5-325 MG PO TABS: 1.0000 | ORAL_TABLET | Freq: Four times a day (QID) | ORAL | 0 refills | Status: DC | PRN

## 2022-01-06 NOTE — Telephone Encounter (Signed)
The refill has been sent. Thanks

## 2022-01-11 ENCOUNTER — Ambulatory Visit
Admission: RE | Admit: 2022-01-11 | Discharge: 2022-01-11 | Disposition: A | Discharge status: Home / Self Care | Payer: Medicaid Other | Source: Ambulatory Visit | Attending: Neurology | Admitting: Neurology

## 2022-01-11 ENCOUNTER — Other Ambulatory Visit: Payer: Self-pay

## 2022-01-11 DIAGNOSIS — M62838 Other muscle spasm: Secondary | ICD-10-CM

## 2022-01-11 DIAGNOSIS — R269 Unspecified abnormalities of gait and mobility: Secondary | ICD-10-CM

## 2022-01-11 LAB — BCR ABL1 FISH (GENPATH)

## 2022-01-11 LAB — BCR/ABL

## 2022-01-11 MED ORDER — IOPAMIDOL (ISOVUE-300) INJECTION 61%
75.0000 mL | Freq: Once | INTRAVENOUS | Status: AC | PRN
  Administered 2022-01-11: 75 mL via INTRAVENOUS

## 2022-01-12 ENCOUNTER — Telehealth: Payer: Self-pay

## 2022-01-12 NOTE — Telephone Encounter (Signed)
Oxycodone  °

## 2022-01-13 ENCOUNTER — Ambulatory Visit: Payer: Medicare Other | Admitting: Sports Medicine

## 2022-01-13 ENCOUNTER — Ambulatory Visit (INDEPENDENT_AMBULATORY_CARE_PROVIDER_SITE_OTHER): Payer: Medicare Other | Admitting: Nurse Practitioner

## 2022-01-13 ENCOUNTER — Encounter: Payer: Self-pay | Admitting: Nurse Practitioner

## 2022-01-13 ENCOUNTER — Other Ambulatory Visit: Payer: Self-pay

## 2022-01-13 VITALS — BP 150/102 | HR 90 | Temp 98.1°F | Ht 69.0 in | Wt 118.6 lb

## 2022-01-13 DIAGNOSIS — M62838 Other muscle spasm: Secondary | ICD-10-CM

## 2022-01-13 DIAGNOSIS — D5720 Sickle-cell/Hb-C disease without crisis: Secondary | ICD-10-CM

## 2022-01-13 DIAGNOSIS — I10 Essential (primary) hypertension: Secondary | ICD-10-CM | POA: Diagnosis not present

## 2022-01-13 MED ORDER — OXYCODONE-ACETAMINOPHEN 5-325 MG PO TABS: 1.0000 | ORAL_TABLET | Freq: Four times a day (QID) | ORAL | 0 refills | Status: DC | PRN

## 2022-01-13 MED ORDER — LISINOPRIL 10 MG PO TABS: 10.0000 mg | ORAL_TABLET | Freq: Every day | ORAL | 0 refills | Status: DC

## 2022-01-13 NOTE — Progress Notes (Signed)
[place], [place]  [address] Phone:  [phone]   Fax:  [phone]   Established Patient Office Visit  Subjective:  Patient ID: Brittany Shannon, female    DOB: [birth year]  Age: 51 y.o. MRN: [id number]  CC:  Chief Complaint  Patient presents with   Follow-up    Pt is here today for her 6 week follow up visit.     HPI Brittany Shannon presents for follow up. She  has a past medical history of Asthma, Deafness in left ear, Homelessness, Hypertension, and Sickle cell anemia ([place]).   She reports being on lisinopril. She reports that she has been off of this. She denies any side effects. She reports that she was told that she was doing ok. However her BP has been > 140/90. Denies headache, dizziness, visual changes, shortness of breath, dyspnea on exertion, chest pain, nausea, vomiting or any edema.   She is following neurology for the tremors.  She is waiting results of imaging.   She is having pain in her knees. She is following up with orthopedics.   She is living alone however a female friend that comes and goes. He helps her with cooking etc.  Past Medical History:  Diagnosis Date   Asthma    Deafness in left ear    Homelessness    Hypertension    Sickle cell anemia (HCC)     Past Surgical History:  Procedure Laterality Date   CHOLECYSTECTOMY     EYE SURGERY     SPLENECTOMY, TOTAL      Family History  Problem Relation Age of Onset   Sickle cell trait Other     Social History   Socioeconomic History   Marital status: Single    Spouse name: Not on file   Number of children: Not on file   Years of education: Not on file   Highest education level: Not on file  Occupational History   Not on file  Tobacco Use   Smoking status: Former    Types: Cigarettes   Smokeless tobacco: Never  Vaping Use   Vaping Use: Never used  Substance and Sexual Activity   Alcohol use: No   Drug use: No   Sexual activity: Yes  Other  Topics Concern   Not on file  Social History Narrative   Not on file   Social Determinants of Health   Financial Resource Strain: Not on file  Food Insecurity: Not on file  Transportation Needs: Not on file  Physical Activity: Not on file  Stress: Not on file  Social Connections: Not on file  Intimate Partner Violence: Not on file    Outpatient Medications Prior to Visit  Medication Sig Dispense Refill   albuterol (VENTOLIN HFA) 108 (90 Base) MCG/ACT inhaler INHALE 2 PUFFS INTO THE LUNGS EVERY 6 HOURS AS NEEDED FOR WHEEZING (Patient taking differently: 1-2 puffs every 6 (six) hours as needed for wheezing or shortness of breath.) 8.5 g 1   baclofen (LIORESAL) 10 MG tablet Take 10 mg by mouth 3 (three) times daily.     bisacodyl (DULCOLAX) 5 MG EC tablet Take 1 tablet (5 mg total) by mouth daily as needed for moderate constipation. 30 tablet 0   cholecalciferol (VITAMIN D3) 25 MCG (1000 UNIT) tablet 1 tablet     lamoTRIgine (LAMICTAL) 100 MG tablet Take 1 tablet (100 mg total) by mouth 2 (two) times daily. 60 tablet 11   lamoTRIgine (LAMICTAL)  25 MG tablet 1 tab bid x one week 2 tab bid x 2nd week 3 tab bid x 3rd week 84 tablet 0   oxyCODONE-acetaminophen (PERCOCET/ROXICET) 5-325 MG tablet Take 1 tablet by mouth every 6 (six) hours as needed for moderate pain. 60 tablet 0   traMADol (ULTRAM) 50 MG tablet Take 25-50 mg by mouth 2 (two) times daily as needed.     albuterol (PROAIR HFA) 108 (90 Base) MCG/ACT inhaler 1 puff as needed     baclofen (LIORESAL) 10 MG tablet 1 tablet as needed     No facility-administered medications prior to visit.    No Known Allergies  ROS Review of Systems  Respiratory:  Negative for shortness of breath.   Cardiovascular:  Negative for chest pain and leg swelling.  Neurological:  Negative for dizziness and headaches.     Objective:    Physical Exam Constitutional:      Comments: Underweight Appears older that stated   HENT:     Head:  Normocephalic and atraumatic.     Nose: Nose normal.     Mouth/Throat:     Mouth: Mucous membranes are moist.  Cardiovascular:     Rate and Rhythm: Normal rate and regular rhythm.     Pulses: Normal pulses.     Heart sounds: Normal heart sounds.  Pulmonary:     Effort: Pulmonary effort is normal.     Breath sounds: Normal breath sounds.  Abdominal:     Palpations: Abdomen is soft.  Musculoskeletal:        General: Normal range of motion.     Cervical back: Normal range of motion.  Skin:    General: Skin is warm and dry.     Capillary Refill: Capillary refill takes less than 2 seconds.  Neurological:     Mental Status: She is alert.    BP (!) 150/102 (BP Location: Left Arm, Cuff Size: Small)    Pulse 90    Temp 98.1 F (36.7 C)    Ht _0  (1.753 m)    Wt 118 lb 9.6 oz (53.8 kg)    LMP 04/07/2013 Comment: neg preg 11/03/16   SpO2 100%    BMI 17.51 kg/m  Wt Readings from Last 3 Encounters:  01/13/22 118 lb 9.6 oz (53.8 kg)  01/03/22 118 lb (53.5 kg)  12/20/21 117 lb (53.1 kg)     Health Maintenance Due  Topic Date Due   COVID-19 Vaccine (1) Never done    There are no preventive care reminders to display for this patient.  Lab Results  Component Value Date   TSH 1.230 12/20/2021   Lab Results  Component Value Date   WBC 7.2 01/03/2022   HGB 11.2 (L) 01/03/2022   HCT 29.6 (L) 01/03/2022   MCV 85.1 01/03/2022   PLT 297 01/03/2022   Lab Results  Component Value Date   NA 137 01/03/2022   K 3.3 (L) 01/03/2022   CO2 26 01/03/2022   GLUCOSE 93 01/03/2022   BUN 12 01/03/2022   CREATININE 0.86 01/03/2022   BILITOT 2.4 (H) 01/03/2022   ALKPHOS 69 01/03/2022   AST 28 01/03/2022   ALT 18 01/03/2022   PROT 8.2 (H) 01/03/2022   ALBUMIN 4.5 01/03/2022   CALCIUM 9.9 01/03/2022   ANIONGAP 6 01/03/2022   EGFR 92 11/30/2021   Lab Results  Component Value Date   CHOL 119 04/29/2019   Lab Results  Component Value Date   HDL 37 (L) 04/29/2019  Lab Results   Component Value Date   LDLCALC 70 04/29/2019   Lab Results  Component Value Date   TRIG 62 04/29/2019   Lab Results  Component Value Date   CHOLHDL 3.2 04/29/2019   Lab Results  Component Value Date   HGBA1C 4.4 07/23/2019      Assessment & Plan:   Problem List Items Addressed This Visit       Cardiovascular and Mediastinum   HTN (hypertension) Uncontrolled  Restarted Lisinopril 10 mg  6 week fu     Relevant Medications   lisinopril (ZESTRIL) 10 MG tablet     Other   Hemoglobin S-C disease (HCC) - Primary Stable Ensure adequate hydration. Move frequently to reduce venous thromboembolism risk. Avoid situations that could lead to dehydration or could exacerbate pain Discussed S&S of infection, seizures, stroke acute chest, DVT and how important it is to seek medical attention Take medication as directed along with pain contract and overall compliance Discussed the risk related to opiate use (addition, tolerance and dependency)    Muscle spasm of right leg Persistent  Continue to follow up with Neurology     Meds ordered this encounter  Medications   lisinopril (ZESTRIL) 10 MG tablet    Sig: Take 1 tablet (10 mg total) by mouth daily.    Dispense:  90 tablet    Refill:  0    Order Specific Question:   Supervising Provider    Answer:   Tresa Garter [id number]   oxyCODONE-acetaminophen (PERCOCET/ROXICET) 5-325 MG tablet    Sig: Take 1 tablet by mouth every 6 (six) hours as needed for moderate pain.    Dispense:  60 tablet    Refill:  0    Order Specific Question:   Supervising Provider    Answer:   Tresa Garter [id number]    Follow-up: Return in about 6 weeks (around 02/24/2022) for Follow up SCD [id number].    Vevelyn Francois, NP

## 2022-01-13 NOTE — Patient Instructions (Signed)
Sickle Cell Anemia, Adult Sickle cell anemia is a condition where your red blood cells are shaped like sickles. Red blood cells carry oxygen through the body. Sickle-shaped cells do not live as long as normal red blood cells. They also clump together and block blood from flowing through the blood vessels. This prevents the body from getting enough oxygen. Sickle cell anemia causes organ damage and pain. It also increases the risk of infection. Follow these instructions at home: Medicines Take over-the-counter and prescription medicines only as told by your doctor. If you were prescribed an antibiotic medicine, take it as told by your doctor. Do not stop taking the antibiotic even if you start to feel better. If you develop a fever, do not take medicines to lower the fever right away. Tell your doctor about the fever. Managing pain, stiffness, and swelling Try these methods to help with pain: Use a heating pad. Take a warm bath. Distract yourself, such as by watching TV. Eating and drinking Drink enough fluid to keep your pee (urine) clear or pale yellow. Drink more in hot weather and during exercise. Limit or avoid alcohol. Eat a healthy diet. Eat plenty of fruits, vegetables, whole grains, and lean protein. Take vitamins and supplements as told by your doctor. Traveling When traveling, keep these with you: Your medical information. The names of your doctors. Your medicines. If you need to take an airplane, talk to your doctor first. Activity Rest often. Avoid exercises that make your heart beat much faster, such as jogging. General instructions Do not use products that have nicotine or tobacco, such as cigarettes and e-cigarettes. If you need help quitting, ask your doctor. Consider wearing a medical alert bracelet. Avoid being in high places (high altitudes), such as mountains. Avoid very hot or cold temperatures. Avoid places where the temperature changes a lot. Keep all follow-up  visits as told by your doctor. This is important. Contact a doctor if: A joint hurts. Your feet or hands hurt or swell. You feel tired (fatigued). Get help right away if: You have symptoms of infection. These include: Fever. Chills. Being very tired. Irritability. Poor eating. Throwing up (vomiting). You feel dizzy or faint. You have new stomach pain, especially on the left side. You have a an erection (priapism) that lasts more than 4 hours. You have numbness in your arms or legs. You have a hard time moving your arms or legs. You have trouble talking. You have pain that does not go away when you take medicine. You are short of breath. You are breathing fast. You have a long-term cough. You have pain in your chest. You have a bad headache. You have a stiff neck. Your stomach looks bloated even though you did not eat much. Your skin is pale. You suddenly cannot see well. Summary Sickle cell anemia is a condition where your red blood cells are shaped like sickles. Follow your doctor's advice on ways to manage pain, food to eat, activities to do, and steps to take for safe travel. Get medical help right away if you have any signs of infection, such as a fever. This information is not intended to replace advice given to you by your health care provider. Make sure you discuss any questions you have with your health care provider. Document Revised: 04/22/2020 Document Reviewed: 04/22/2020 Elsevier Patient Education  [place].

## 2022-01-17 ENCOUNTER — Inpatient Hospital Stay: Payer: Medicare Other | Attending: Physician Assistant | Admitting: Hematology

## 2022-01-17 DIAGNOSIS — R7989 Other specified abnormal findings of blood chemistry: Secondary | ICD-10-CM | POA: Insufficient documentation

## 2022-01-17 DIAGNOSIS — D72829 Elevated white blood cell count, unspecified: Secondary | ICD-10-CM | POA: Insufficient documentation

## 2022-01-17 DIAGNOSIS — I10 Essential (primary) hypertension: Secondary | ICD-10-CM | POA: Insufficient documentation

## 2022-01-17 DIAGNOSIS — Z87891 Personal history of nicotine dependence: Secondary | ICD-10-CM | POA: Insufficient documentation

## 2022-01-17 DIAGNOSIS — E785 Hyperlipidemia, unspecified: Secondary | ICD-10-CM | POA: Insufficient documentation

## 2022-01-18 ENCOUNTER — Telehealth: Payer: Self-pay | Admitting: Neurology

## 2022-01-18 NOTE — Telephone Encounter (Signed)
Pt is needing a refill request for her lamoTRIgine (LAMICTAL) 25 MG tablet sent to the [facility] on [address].

## 2022-01-18 NOTE — Telephone Encounter (Signed)
I spoke to the patient. She has completed the lamotrigine 25mg  titration prescription. However, she was confused and taking the 100mg  tablets BID at the same time. Luckily, she did not have any adverse side effects, including no rash.   She verbalized understanding to only continue taking lamotrigine 100mg , one tab BID.

## 2022-01-23 NOTE — Progress Notes (Addendum)
[name]   HEMATOLOGY/ONCOLOGY PHONE VISIT NOTE  Date of Service: 01/30/2022  Patient Care Team: Vevelyn Francois, NP as PCP - General (Adult Health Nurse Practitioner) Sueanne Margarita, DO as Consulting Physician (Internal Medicine)  CHIEF COMPLAINTS/PURPOSE OF CONSULTATION:  Phone visit to discuss lab results for leukocytosis  HISTORY OF PRESENTING ILLNESS:   Brittany Shannon is a wonderful 51 y.o. female who has been referred to [place] by Dr .Edison Pace, Diona Foley, NP or evaluation and management of leukocytosis.  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  Patient was referred to [place] by her primary care physician for leukocytosis.  Review of labs suggest intermittent leukocytosis with WBC counts ranging from 10.6-15.5k over the last year.  This typically has been in the context of increased neutrophils and nucleated red blood cells likely being read by the Coulter counter as WBCs.  Patient notes intermittent pain crises.  Has had difficulties with follow-up with her sickle cell clinic due to social issues and transportation issues. Notes no recent history of infections. Has been getting her sickle cell cares with her primary care physician. Is not very aware of her hemoglobin [place] disease related health considerations. She is an everyday smoker and smokes up to 1 pack/day.  Denies any other drug use. No abdominal pain or distention. No acute chest pain or shortness of breath.  No fevers no chills no night sweats no unexpected weight loss.  INTERVAL HISTORY  .I connected with Brittany Shannon on 01/17/2022 at  9:20 AM EST by telephone visit and verified that I am speaking with the correct person using two  identifiers.   I discussed the limitations, risks, security and privacy concerns of performing an evaluation and management service by telemedicine and the availability of in-person appointments. I also discussed with the patient that there may be a patient responsible charge related to this service. The patient expressed understanding and agreed to proceed.   Other persons participating in the visit and their role in the encounter: None  Patients location: Home Providers location: [place]  Chief Complaint: Discussion of lab work-up for leukocytosis.  Patient notes no new symptoms since her last clinic visit. Lab results were discussed in detail with her.  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  MEDICAL HISTORY:  Past Medical History:  Diagnosis Date   Asthma    Deafness in left ear    Homelessness    Hypertension    Sickle cell anemia (HCC)   Hb [place] disease  SURGICAL HISTORY: Past Surgical History:  Procedure Laterality Date   CHOLECYSTECTOMY     EYE SURGERY     SPLENECTOMY, TOTAL      SOCIAL HISTORY: Social History   Socioeconomic History   Marital status: Single    Spouse name: Not on file   Number of children: Not on file   Years of education: Not on file   Highest education level: Not on file  Occupational History   Not on file  Tobacco Use   Smoking  status: Former    Types: Cigarettes   Smokeless tobacco: Never  Vaping Use   Vaping Use: Never used  Substance and Sexual Activity   Alcohol use: No   Drug use: No   Sexual activity: Yes  Other Topics Concern   Not on file  Social History Narrative   Not on file   Social Determinants of Health   Financial Resource Strain: Not on file  Food Insecurity: Not on file   Transportation Needs: Not on file  Physical Activity: Not on file  Stress: Not on file  Social Connections: Not on file  Intimate Partner Violence: Not on file    FAMILY HISTORY: Family History  Problem Relation Age of Onset   Sickle cell trait Other     ALLERGIES:  has No Known Allergies.  MEDICATIONS:  Current Outpatient Medications  Medication Sig Dispense Refill   albuterol (VENTOLIN HFA) 108 (90 Base) MCG/ACT inhaler INHALE 2 PUFFS INTO THE LUNGS EVERY 6 HOURS AS NEEDED FOR WHEEZING (Patient taking differently: 1-2 puffs every 6 (six) hours as needed for wheezing or shortness of breath.) 8.5 g 1   bisacodyl (DULCOLAX) 5 MG EC tablet Take 1 tablet (5 mg total) by mouth daily as needed for moderate constipation. 30 tablet 0   cholecalciferol (VITAMIN D3) 25 MCG (1000 UNIT) tablet 1 tablet     lamoTRIgine (LAMICTAL) 100 MG tablet Take 1 tablet (100 mg total) by mouth 2 (two) times daily. 60 tablet 11   lisinopril (ZESTRIL) 10 MG tablet Take 1 tablet (10 mg total) by mouth daily. 90 tablet 0   oxyCODONE-acetaminophen (PERCOCET/ROXICET) 5-325 MG tablet Take 1 tablet by mouth every 6 (six) hours as needed for moderate pain. 60 tablet 0   No current facility-administered medications for this visit.   PHYSICAL EXAMINATION:  Telemedicine visit  LABORATORY DATA:  I have reviewed the data as listed  . CBC Latest Ref Rng & Units 01/03/2022 11/30/2021 11/30/2021  WBC 4.0 - 10.5 K/uL 7.2 10.6(H) 8.7  Hemoglobin 12.0 - 15.0 g/dL 11.2(L) 9.4(L) 9.7(L)  Hematocrit 36.0 - 46.0 % 29.6(L) 25.0(L) 30.0(L)  Platelets 150 - 400 K/uL 297 248 248   .CBC    Component Value Date/Time   WBC 7.2 01/03/2022 1357   RBC 3.45 (L) 01/03/2022 1357   RBC 3.48 (L) 01/03/2022 1357   HGB 11.2 (L) 01/03/2022 1357   HGB 9.7 (L) 11/30/2021 1153   HCT 29.6 (L) 01/03/2022 1357   HCT 30.0 (L) 11/30/2021 1153   PLT 297 01/03/2022 1357   PLT 248 11/30/2021 1153   MCV 85.1 01/03/2022 1357   MCV 102 (H)  11/30/2021 1153   MCV 102 (H) 02/13/2012 2057   MCH 32.2 01/03/2022 1357   MCHC 37.8 (H) 01/03/2022 1357   RDW 15.8 (H) 01/03/2022 1357   RDW 16.3 (H) 11/30/2021 1153   RDW 17.4 (H) 02/13/2012 2057   LYMPHSABS 1.9 01/03/2022 1357   LYMPHSABS 2.4 11/30/2021 1153   MONOABS 0.8 01/03/2022 1357   EOSABS 0.1 01/03/2022 1357   EOSABS 0.6 (H) 11/30/2021 1153   BASOSABS 0.1 01/03/2022 1357   BASOSABS 0.1 11/30/2021 1153    . CMP Latest Ref Rng & Units 01/03/2022 11/30/2021 11/30/2021  Glucose 70 - 99 mg/dL 93 101(H) 81  BUN 6 - 20 mg/dL 12 23(H) 17  Creatinine 0.44 - 1.00 mg/dL 0.86 0.63 0.78  Sodium 135 - 145 mmol/L 137 137 137  Potassium 3.5 - 5.1 mmol/L 3.3(L) 3.4(L) 4.1  Chloride 98 -  111 mmol/L 105 101 97  CO2 22 - 32 mmol/L 26 29 27   Calcium 8.9 - 10.3 mg/dL 9.9 9.3 9.5  Total Protein 6.5 - 8.1 g/dL 8.2(H) 7.6 7.5  Total Bilirubin 0.3 - 1.2 mg/dL 2.4(H) 1.4(H) 1.3(H)  Alkaline Phos 38 - 126 U/L 69 89 102  AST 15 - 41 U/L 28 44(H) 49(H)  ALT 0 - 44 U/L 18 44 41(H)     Component     Latest Ref Rng & Units 01/03/2022  Retic Ct Pct     0.4 - 3.1 % 4.8 (H)  RBC.     3.87 - 5.11 MIL/uL 3.45 (L)  Retic Count, Absolute     19.0 - 186.0 K/uL 164.9  Immature Retic Fract     2.3 - 15.9 % 25.6 (H)  Sed Rate     0 - 22 mm/hr 6  Haptoglobin     33 - 346 mg/dL <10 (L)  LDH     98 - 192 U/L 253 (H)     RADIOGRAPHIC STUDIES: I have personally reviewed the radiological images as listed and agreed with the findings in the report. CT HEAD W & WO CONTRAST (5MM)  Result Date: 01/12/2022 CLINICAL DATA:  Weakness, leg tremors, unable to pick right leg up for 7-8 months after falling 04/20/2021 EXAM: CT HEAD WITHOUT AND WITH CONTRAST TECHNIQUE: Contiguous axial images were obtained from the base of the skull through the vertex without and with intravenous contrast. RADIATION DOSE REDUCTION: This exam was performed according to the departmental dose-optimization program which includes  automated exposure control, adjustment of the mA and/or kV according to patient size and/or use of iterative reconstruction technique. CONTRAST:  61mL ISOVUE-300 IOPAMIDOL (ISOVUE-300) INJECTION 61% COMPARISON:  CT head 10/24/2016 FINDINGS: Brain: Streak artifact from a right cochlear implant degrades evaluation of portions of the right cerebral hemisphere. There is no evidence of acute intracranial hemorrhage, extra-axial fluid collection, or acute infarct. Parenchymal volume is normal. The ventricles are normal in size. There is a small region of encephalomalacia in the left frontal lobe consistent with prior insult. The parenchyma is otherwise normal in appearance. There is no mass lesion or abnormal enhancement. There is no midline shift. Vascular: No hyperdense vessel or unexpected calcification. Visible vessels are patent. Skull: Normal. Negative for fracture or focal lesion. Sinuses/Orbits: The imaged paranasal sinuses are clear. A right lens implant is in place. The globes and orbits are otherwise unremarkable. Other: A right cochlear implant is in place. The mastoidectomy bowl is clear. The left mastoid air cells and middle ear cavity are clear. IMPRESSION: 1. No acute intracranial pathology. 2. Small focus of encephalomalacia in the left frontal lobe consistent with prior insult. Electronically Signed   By: Valetta Mole M.D.   On: 01/12/2022 12:12   CT CERVICAL SPINE WO CONTRAST  Result Date: 01/12/2022 CLINICAL DATA:  Weakness, leg tremors, unable to pick right leg up for 7-8 months after fall onto knees 04/20/2021 EXAM: CT CERVICAL SPINE WITHOUT CONTRAST CT THORACIC SPINE WITHOUT CONTRAST TECHNIQUE: Multidetector CT imaging of the cervical and thoracic spine was performed without contrast. Multiplanar CT image reconstructions were also generated. RADIATION DOSE REDUCTION: This exam was performed according to the departmental dose-optimization program which includes automated exposure control,  adjustment of the mA and/or kV according to patient size and/or use of iterative reconstruction technique. COMPARISON:  Two-view chest radiograph 11/03/2016 FINDINGS: CT CERVICAL SPINE FINDINGS Alignment: Is straightening of the normal cervical spine lordosis with slight reversal centered  at C4-C5. There is no antero or retrolisthesis. There is no jumped or perched facets or other evidence of traumatic malalignment. Skull base and vertebrae: Skull base alignment is maintained. Vertebral body heights are preserved. There is no evidence of acute fracture. There is no suspicious osseous lesion. Soft tissues and spinal canal: No prevertebral fluid or swelling. No visible canal hematoma. The palatine tonsils are prominent bilaterally with multiple small tonsilliths. There is no lymphadenopathy in the neck. Disc levels: There is mild disc space narrowing at C5-C6. There is mild degenerative endplate change at S9-F0 and C5-C6. The osseous spinal canal and neural foramina are patent. Upper chest: There is emphysema in the lung apices. Other: None. CT THORACIC SPINE FINDINGS Alignment: There is minimal dextrocurvature centered in the midthoracic spine. There is no antero or retrolisthesis. There is no jumped or perched facets or other evidence of traumatic malalignment. Vertebrae: Vertebral body heights are preserved. There is no evidence of acute fracture. There is no suspicious osseous lesion. Paraspinal and other soft tissues: Negative. Disc levels: The disc spaces are overall preserved. There is no significant degenerative change in the thoracic spine. The spinal canal and neural foramina appear patent. IMPRESSION: 1. Minimal degenerative changes at C4-C5 and C5-C6. Otherwise, unremarkable cervical spine CT with no acute findings, and no significant spinal canal or neural foraminal stenosis. 2. Unremarkable thoracic spine CT. 3. Mild centrilobular emphysema in the lung apices. Electronically Signed   By: Valetta Mole  M.D.   On: 01/12/2022 12:20   CT THORACIC SPINE WO CONTRAST  Result Date: 01/12/2022 CLINICAL DATA:  Weakness, leg tremors, unable to pick right leg up for 7-8 months after fall onto knees 04/20/2021 EXAM: CT CERVICAL SPINE WITHOUT CONTRAST CT THORACIC SPINE WITHOUT CONTRAST TECHNIQUE: Multidetector CT imaging of the cervical and thoracic spine was performed without contrast. Multiplanar CT image reconstructions were also generated. RADIATION DOSE REDUCTION: This exam was performed according to the departmental dose-optimization program which includes automated exposure control, adjustment of the mA and/or kV according to patient size and/or use of iterative reconstruction technique. COMPARISON:  Two-view chest radiograph 11/03/2016 FINDINGS: CT CERVICAL SPINE FINDINGS Alignment: Is straightening of the normal cervical spine lordosis with slight reversal centered at C4-C5. There is no antero or retrolisthesis. There is no jumped or perched facets or other evidence of traumatic malalignment. Skull base and vertebrae: Skull base alignment is maintained. Vertebral body heights are preserved. There is no evidence of acute fracture. There is no suspicious osseous lesion. Soft tissues and spinal canal: No prevertebral fluid or swelling. No visible canal hematoma. The palatine tonsils are prominent bilaterally with multiple small tonsilliths. There is no lymphadenopathy in the neck. Disc levels: There is mild disc space narrowing at C5-C6. There is mild degenerative endplate change at Y6-V7 and C5-C6. The osseous spinal canal and neural foramina are patent. Upper chest: There is emphysema in the lung apices. Other: None. CT THORACIC SPINE FINDINGS Alignment: There is minimal dextrocurvature centered in the midthoracic spine. There is no antero or retrolisthesis. There is no jumped or perched facets or other evidence of traumatic malalignment. Vertebrae: Vertebral body heights are preserved. There is no evidence of acute  fracture. There is no suspicious osseous lesion. Paraspinal and other soft tissues: Negative. Disc levels: The disc spaces are overall preserved. There is no significant degenerative change in the thoracic spine. The spinal canal and neural foramina appear patent. IMPRESSION: 1. Minimal degenerative changes at C4-C5 and C5-C6. Otherwise, unremarkable cervical spine CT with no acute  findings, and no significant spinal canal or neural foraminal stenosis. 2. Unremarkable thoracic spine CT. 3. Mild centrilobular emphysema in the lung apices. Electronically Signed   By: Valetta Mole M.D.   On: 01/12/2022 12:20    ASSESSMENT & PLAN:   51 year old lady with a history of hypertension, dyslipidemia, hemoglobin [place] disease referred for evaluation of  1) intermittent nonprogressive leukocytosis This appears to be intermittent and chronic over more than 1 year. 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  2) hemoglobin [place] disease previously followed by [facility]. Baseline hemoglobin apparently around 9. Lab evidence of with increased LDH, low haptoglobin, reticulocytosis and increased bilirubin level. Patient does have intermittent pain crises. Does have a history of retinopathy related to this. Was previously recommended hydroxyurea but has not been taking this.  PLAN -Lab results as noted above were discussed in detail with the patient. 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 -Continue follow-up with primary care physician and as previously noted would recommend referral to [facility] to reestablish care for hemoglobin [place] disease.  3) elevated ferritin level with ferritin of 600s to 900s -Likely due to hemolysis cannot rule out iron overload due to ineffective  erythropoiesis. -hereditary Hemochromatosis genetic testing done and was found to be negative -Continue follow-up with PCP  Sullivan Lone MD [place] [facility] Hematology/Oncology Physician [facility]

## 2022-01-24 ENCOUNTER — Inpatient Hospital Stay: Payer: Medicare Other | Admitting: Hematology

## 2022-01-24 ENCOUNTER — Ambulatory Visit (INDEPENDENT_AMBULATORY_CARE_PROVIDER_SITE_OTHER): Payer: Medicare Other | Admitting: Neurology

## 2022-01-24 ENCOUNTER — Encounter: Payer: Self-pay | Admitting: Neurology

## 2022-01-24 VITALS — BP 150/89 | HR 75 | Ht 69.0 in | Wt 116.5 lb

## 2022-01-24 DIAGNOSIS — I639 Cerebral infarction, unspecified: Secondary | ICD-10-CM

## 2022-01-24 DIAGNOSIS — M62838 Other muscle spasm: Secondary | ICD-10-CM | POA: Diagnosis not present

## 2022-01-24 DIAGNOSIS — R269 Unspecified abnormalities of gait and mobility: Secondary | ICD-10-CM

## 2022-01-24 NOTE — Progress Notes (Incomplete)
[name]   HEMATOLOGY/ONCOLOGY CONSULTATION NOTE  Date of Service: 01/24/2022  Patient Care Team: Vevelyn Francois, NP as PCP - General (Adult Health Nurse Practitioner) Sueanne Margarita, DO as Consulting Physician (Internal Medicine)  CHIEF COMPLAINTS/PURPOSE OF CONSULTATION:  Leukocytosis  HISTORY OF PRESENTING ILLNESS:   Brittany Shannon is a wonderful 51 y.o. female who has been referred to [place] by Dr .Edison Pace, Diona Foley, NP or evaluation and management of leukocytosis.  Patient has a history of hypertension, hemoglobin [place] disease (followed previously at [facility], lost to follow-up, last seen few years ago, was supposed to be on hydroxyurea but is not, intermittent pain crises, history of retinopathy, no history of previous AVN, stroke, heart attack, acute chest syndrome), deafness in left ear, asthma.  Patient was referred to [place] by her primary care physician for leukocytosis.  Review of labs suggest intermittent leukocytosis with WBC counts ranging from 10.6-15.5k over the last year.  This typically has been in the context of increased neutrophils and nucleated red blood cells likely being read by the [employer] counter as WBCs.  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  SURGICAL HISTORY: Past Surgical History:  Procedure Laterality Date   CHOLECYSTECTOMY     EYE SURGERY     SPLENECTOMY, TOTAL      SOCIAL HISTORY: Social History   Socioeconomic History   Marital status: Single    Spouse name: Not on file   Number of children: Not on file   Years of education: Not on file   Highest education level: Not on file  Occupational History   Not on file  Tobacco Use   Smoking status: Former    Types: Cigarettes   Smokeless tobacco: Never  Vaping Use   Vaping Use: Never used  Substance and Sexual Activity   Alcohol use: No   Drug use: No   Sexual  activity: Yes  Other Topics Concern   Not on file  Social History Narrative   Not on file   Social Determinants of Health   Financial Resource Strain: Not on file  Food Insecurity: Not on file  Transportation Needs: Not on file  Physical Activity: Not on file  Stress: Not on file  Social Connections: Not on file  Intimate Partner Violence: Not on file    FAMILY HISTORY: Family History  Problem Relation Age of Onset   Sickle cell trait Other     ALLERGIES:  has No Known Allergies.  MEDICATIONS:  Current Outpatient Medications  Medication Sig Dispense Refill   albuterol (VENTOLIN HFA) 108 (90 Base) MCG/ACT inhaler INHALE 2 PUFFS INTO THE LUNGS EVERY 6 HOURS AS NEEDED FOR WHEEZING (Patient taking differently: 1-2 puffs every 6 (six) hours as needed for wheezing or shortness of breath.) 8.5 g 1   bisacodyl (DULCOLAX) 5 MG EC tablet Take 1 tablet (5 mg total) by mouth daily as needed for moderate constipation. 30 tablet 0   cholecalciferol (VITAMIN D3) 25 MCG (1000 UNIT) tablet 1 tablet     lamoTRIgine (LAMICTAL) 100 MG tablet Take 1 tablet (100 mg total) by mouth 2 (two) times daily. 60 tablet 11   lisinopril (ZESTRIL) 10 MG tablet Take 1 tablet (10 mg total) by mouth daily. 90 tablet 0   oxyCODONE-acetaminophen (PERCOCET/ROXICET) 5-325 MG tablet Take 1 tablet by mouth every  6 (six) hours as needed for moderate pain. 60 tablet 0   No current facility-administered medications for this visit.    REVIEW OF SYSTEMS:    10 Point review of Systems was done is negative except as noted above.  PHYSICAL EXAMINATION: ECOG PERFORMANCE STATUS: 2  . There were no vitals filed for this visit.  There were no vitals filed for this visit.  .There is no height or weight on file to calculate BMI. [name] GENERAL:alert, in no acute distress and comfortable SKIN: no acute rashes, no significant lesions EYES: conjunctiva are pink and non-injected, sclera anicteric OROPHARYNX: MMM, no exudates,  no oropharyngeal erythema or ulceration NECK: supple, no JVD LYMPH:  no palpable lymphadenopathy in the cervical, axillary or inguinal regions LUNGS: clear to auscultation b/l with normal respiratory effort HEART: regular rate & rhythm ABDOMEN:  normoactive bowel sounds , non tender, not distended. Extremity: no pedal edema PSYCH: alert & oriented x 3 with fluent speech NEURO: no focal motor/sensory deficits ***  LABORATORY DATA:  I have reviewed the data as listed  . CBC Latest Ref Rng & Units 01/03/2022 11/30/2021 11/30/2021  WBC 4.0 - 10.5 K/uL 7.2 10.6(H) 8.7  Hemoglobin 12.0 - 15.0 g/dL 11.2(L) 9.4(L) 9.7(L)  Hematocrit 36.0 - 46.0 % 29.6(L) 25.0(L) 30.0(L)  Platelets 150 - 400 K/uL 297 248 248    . CMP Latest Ref Rng & Units 01/03/2022 11/30/2021 11/30/2021  Glucose 70 - 99 mg/dL 93 101(H) 81  BUN 6 - 20 mg/dL 12 23(H) 17  Creatinine 0.44 - 1.00 mg/dL 0.86 0.63 0.78  Sodium 135 - 145 mmol/L 137 137 137  Potassium 3.5 - 5.1 mmol/L 3.3(L) 3.4(L) 4.1  Chloride 98 - 111 mmol/L 105 101 97  CO2 22 - 32 mmol/L 26 29 27   Calcium 8.9 - 10.3 mg/dL 9.9 9.3 9.5  Total Protein 6.5 - 8.1 g/dL 8.2(H) 7.6 7.5  Total Bilirubin 0.3 - 1.2 mg/dL 2.4(H) 1.4(H) 1.3(H)  Alkaline Phos 38 - 126 U/L 69 89 102  AST 15 - 41 U/L 28 44(H) 49(H)  ALT 0 - 44 U/L 18 44 41(H)     Component     Latest Ref Rng & Units 01/03/2022  Retic Ct Pct     0.4 - 3.1 % 4.8 (H)  RBC.     3.87 - 5.11 MIL/uL 3.45 (L)  Retic Count, Absolute     19.0 - 186.0 K/uL 164.9  Immature Retic Fract     2.3 - 15.9 % 25.6 (H)  Sed Rate     0 - 22 mm/hr 6  Haptoglobin     33 - 346 mg/dL <10 (L)  LDH     98 - 192 U/L 253 (H)     RADIOGRAPHIC STUDIES: I have personally reviewed the radiological images as listed and agreed with the findings in the report. CT HEAD W & WO CONTRAST (5MM)  Result Date: 01/12/2022 CLINICAL DATA:  Weakness, leg tremors, unable to pick right leg up for 7-8 months after falling 04/20/2021  EXAM: CT HEAD WITHOUT AND WITH CONTRAST TECHNIQUE: Contiguous axial images were obtained from the base of the skull through the vertex without and with intravenous contrast. RADIATION DOSE REDUCTION: This exam was performed according to the departmental dose-optimization program which includes automated exposure control, adjustment of the mA and/or kV according to patient size and/or use of iterative reconstruction technique. CONTRAST:  55mL ISOVUE-300 IOPAMIDOL (ISOVUE-300) INJECTION 61% COMPARISON:  CT head 10/24/2016 FINDINGS: Brain: Streak artifact from a  right cochlear implant degrades evaluation of portions of the right cerebral hemisphere. There is no evidence of acute intracranial hemorrhage, extra-axial fluid collection, or acute infarct. Parenchymal volume is normal. The ventricles are normal in size. There is a small region of encephalomalacia in the left frontal lobe consistent with prior insult. The parenchyma is otherwise normal in appearance. There is no mass lesion or abnormal enhancement. There is no midline shift. Vascular: No hyperdense vessel or unexpected calcification. Visible vessels are patent. Skull: Normal. Negative for fracture or focal lesion. Sinuses/Orbits: The imaged paranasal sinuses are clear. A right lens implant is in place. The globes and orbits are otherwise unremarkable. Other: A right cochlear implant is in place. The mastoidectomy bowl is clear. The left mastoid air cells and middle ear cavity are clear. IMPRESSION: 1. No acute intracranial pathology. 2. Small focus of encephalomalacia in the left frontal lobe consistent with prior insult. Electronically Signed   By: Valetta Mole M.D.   On: 01/12/2022 12:12   CT CERVICAL SPINE WO CONTRAST  Result Date: 01/12/2022 CLINICAL DATA:  Weakness, leg tremors, unable to pick right leg up for 7-8 months after fall onto knees 04/20/2021 EXAM: CT CERVICAL SPINE WITHOUT CONTRAST CT THORACIC SPINE WITHOUT CONTRAST TECHNIQUE:  Multidetector CT imaging of the cervical and thoracic spine was performed without contrast. Multiplanar CT image reconstructions were also generated. RADIATION DOSE REDUCTION: This exam was performed according to the departmental dose-optimization program which includes automated exposure control, adjustment of the mA and/or kV according to patient size and/or use of iterative reconstruction technique. COMPARISON:  Two-view chest radiograph 11/03/2016 FINDINGS: CT CERVICAL SPINE FINDINGS Alignment: Is straightening of the normal cervical spine lordosis with slight reversal centered at C4-C5. There is no antero or retrolisthesis. There is no jumped or perched facets or other evidence of traumatic malalignment. Skull base and vertebrae: Skull base alignment is maintained. Vertebral body heights are preserved. There is no evidence of acute fracture. There is no suspicious osseous lesion. Soft tissues and spinal canal: No prevertebral fluid or swelling. No visible canal hematoma. The palatine tonsils are prominent bilaterally with multiple small tonsilliths. There is no lymphadenopathy in the neck. Disc levels: There is mild disc space narrowing at C5-C6. There is mild degenerative endplate change at P2-Z3 and C5-C6. The osseous spinal canal and neural foramina are patent. Upper chest: There is emphysema in the lung apices. Other: None. CT THORACIC SPINE FINDINGS Alignment: There is minimal dextrocurvature centered in the midthoracic spine. There is no antero or retrolisthesis. There is no jumped or perched facets or other evidence of traumatic malalignment. Vertebrae: Vertebral body heights are preserved. There is no evidence of acute fracture. There is no suspicious osseous lesion. Paraspinal and other soft tissues: Negative. Disc levels: The disc spaces are overall preserved. There is no significant degenerative change in the thoracic spine. The spinal canal and neural foramina appear patent. IMPRESSION: 1. Minimal  degenerative changes at C4-C5 and C5-C6. Otherwise, unremarkable cervical spine CT with no acute findings, and no significant spinal canal or neural foraminal stenosis. 2. Unremarkable thoracic spine CT. 3. Mild centrilobular emphysema in the lung apices. Electronically Signed   By: Valetta Mole M.D.   On: 01/12/2022 12:20   CT THORACIC SPINE WO CONTRAST  Result Date: 01/12/2022 CLINICAL DATA:  Weakness, leg tremors, unable to pick right leg up for 7-8 months after fall onto knees 04/20/2021 EXAM: CT CERVICAL SPINE WITHOUT CONTRAST CT THORACIC SPINE WITHOUT CONTRAST TECHNIQUE: Multidetector CT imaging of the cervical and  thoracic spine was performed without contrast. Multiplanar CT image reconstructions were also generated. RADIATION DOSE REDUCTION: This exam was performed according to the departmental dose-optimization program which includes automated exposure control, adjustment of the mA and/or kV according to patient size and/or use of iterative reconstruction technique. COMPARISON:  Two-view chest radiograph 11/03/2016 FINDINGS: CT CERVICAL SPINE FINDINGS Alignment: Is straightening of the normal cervical spine lordosis with slight reversal centered at C4-C5. There is no antero or retrolisthesis. There is no jumped or perched facets or other evidence of traumatic malalignment. Skull base and vertebrae: Skull base alignment is maintained. Vertebral body heights are preserved. There is no evidence of acute fracture. There is no suspicious osseous lesion. Soft tissues and spinal canal: No prevertebral fluid or swelling. No visible canal hematoma. The palatine tonsils are prominent bilaterally with multiple small tonsilliths. There is no lymphadenopathy in the neck. Disc levels: There is mild disc space narrowing at C5-C6. There is mild degenerative endplate change at G9-Q1 and C5-C6. The osseous spinal canal and neural foramina are patent. Upper chest: There is emphysema in the lung apices. Other: None. CT  THORACIC SPINE FINDINGS Alignment: There is minimal dextrocurvature centered in the midthoracic spine. There is no antero or retrolisthesis. There is no jumped or perched facets or other evidence of traumatic malalignment. Vertebrae: Vertebral body heights are preserved. There is no evidence of acute fracture. There is no suspicious osseous lesion. Paraspinal and other soft tissues: Negative. Disc levels: The disc spaces are overall preserved. There is no significant degenerative change in the thoracic spine. The spinal canal and neural foramina appear patent. IMPRESSION: 1. Minimal degenerative changes at C4-C5 and C5-C6. Otherwise, unremarkable cervical spine CT with no acute findings, and no significant spinal canal or neural foraminal stenosis. 2. Unremarkable thoracic spine CT. 3. Mild centrilobular emphysema in the lung apices. Electronically Signed   By: Valetta Mole M.D.   On: 01/12/2022 12:20    ASSESSMENT & PLAN:   51 year old lady with a history of hypertension, dyslipidemia, hemoglobin [place] disease referred for evaluation of  1) leukocytosis This appears to be intermittent and chronic over more than 1 year. 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  2) hemoglobin [place] disease previously followed by [facility]. Baseline hemoglobin apparently around 9. Lab evidence of with increased LDH, low haptoglobin, reticulocytosis and increased bilirubin level. Patient does have intermittent pain crises. Does have a history of retinopathy related to this. Was previously recommended hydroxyurea but has not been taking this.  PLAN*** -Lab work-up for leukocytosis was done.  Repeat labs today show no leukocytosis with normal WBC count hemoglobin is 11.2 with normal platelets. -BCR ABL FISH panel negative  ruling out CML -I discussed with the patient that her intermittent leukocytosis is likely reactive from smoking, inflammation hemolysis and pain crisis from her hemoglobin [place] disease and her increased nucleated red blood cells likely being read as WBC is by the [employer] counter. -No indication for additional work-up for the patient's leukocytosis at this time. -Our hematology practice does not take over care for sickle cell disease as a policy. -Would recommend patient be referred to [facility] for ongoing management of her hemoglobin [place] disease in tandem with her primary care physician. -She will need to be considered for hydroxyurea for her hemoglobin [place] disease, appropriate vaccinations, pain management other extensive  healthcare maintenance strategies. -She would also need to be on folic acid 2 mg p.o. 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 -Patient was counseled in detail about importance of maintaining her medication and follow-up compliance. -We shall see her back as needed if any other questions or concerns arise. All of the patients questions were answered with apparent satisfaction. The patient knows to call the clinic with any problems, questions or concerns.  3) elevated ferritin level with ferritin of 600s to 900s -Likely due to hemolysis cannot rule out iron overload due to ineffective erythropoiesis. -hereditary Hemochromatosis genetic testing done and was found to be negative -Continue follow-up with PCP . No orders of the defined types were placed in this encounter.    I spent *** mins counseling the patient face to face. The total time spent in the appointment was *** mins including extensive chart review, discussion about her hemoglobin [place] disease, elevated iron, ordering and evaluation of labs and  documentation    Sullivan Lone MD [place] AAHIVMS [facility] Hematology/Oncology Physician [facility]  I, Melene Muller, am acting as scribe for Dr. Sullivan Lone, MD.

## 2022-01-24 NOTE — Progress Notes (Signed)
Chief Complaint  Patient presents with   Follow-up    Rm 12. Alone. PCP is Dionisio David, NP. Pt reports right leg spasms have not improved. Continues on lamotrigine BID. F/u CT scan.      ASSESSMENT AND PLAN  Brittany Shannon is a 51 y.o. female   Right lower extremities myoclonic jerking movement,  Hyperreflexia of bilateral lower extremity,  Not MRI candidate due to cochlear implant,  Extensive imaging study of neuraxis, CT of the head, cervical thoracic spine showed no significant pathology  Patient showed variable effort on examination, and her lower extremity jerking movement pattern changed drastically with positioning of her right leg, suggestive of a functional component  Will check lamotrigine level, EEG, if there is no significant abnormalities, she will continue follow-up with her primary care physician, encouraged her moderate exercise    Right hip pain,  X-ray of right hip in January 2023 showed no significant pathology     DIAGNOSTIC DATA (LABS, IMAGING, TESTING) - I reviewed patient records, labs, notes, testing and imaging myself where available.   MEDICAL HISTORY:  Brittany Shannon, is a 51 year old female, seen in request by her primary care nurse practitioner Vevelyn Francois, for evaluation of right lower extremity spasm, initial evaluation was on December 20, 2021  I reviewed and summarized the referring note. PMHx Sickle cell anemia Splenectomy  She had a history of sickle cell anemia, had splenectomy, which has helped her sickle cell crisis  She had right knee injury, she fell on Apr 21, 2019 when her right foot was caught in a cable cord, she felt on her right knee, before that she reported walking without difficulty, work as a Glass blower/designer  Right knee pain has some improvement, but still reported moderate pain, since then she had frequent uncontrollable right lower extremity muscle jerking, I was able to witness few episode, mimic right lower  extremity myoclonic activity, she has mild right lower extremity weakness, reported mainly due to her right knee pain,  She has no control of her right lower extremity muscles spasm, jerking movement, is usually triggered by right knee extension, it can last for few minutes or even longer, make her very tired, she does has asymmetric lower extremity deep tendon reflex, right lower extremity was hyperreflexia  She denies left lower extremity involvement, denies spreading to right upper extremity, she also complains of right hip pain  She has sensorineural loss hearing loss, status post right cochlear implant, not MRIs candidate  UPDATE Jan 24 2022: I personally reviewed CT cervical spine, mild degenerative changes, no significant canal or foraminal narrowing  CT thoracic spine showed no significant pathology  CT head without contrast, small left frontal encephalomalacia, no acute abnormality  Laboratory evaluations in January 2023, CBC showed hemoglobin of 11.2, CMP showed potassium of 3.3, total protein of 8.2, bilirubin of 2.4, lactate dehydrogenase elevated 253, elevated immature reticular cell fraction, normal ESR, ferritin level was elevated 643, normal copper, B12, CPK, TSH, negative RPR, HIV,  Extensive evaluation failed to demonstrate significant abnormality to explain her new onset right leg jerking movement, I was able to observing that she reported that she has worsening right leg jerking movement with right leg extension, but multiple times she purposely extend her right leg followed by increase to right leg tremor, if I help her to flex her right knee, she will continue her left jerking movement, but with significant change of her neck abnormal movement pattern, then she will develop bilateral lower extremity jerking  movement, with pelvic thrusting as well, no loss of consciousness, no upper extremity involvement, she denies significant pain  She reported being compliant with  lamotrigine 100 twice a day, no improvement,  PHYSICAL EXAM:   Vitals:   01/24/22 0852  BP: (!) 150/89  Pulse: 75  Weight: 116 lb 8 oz (52.8 kg)  Height: 5' 9"  (1.753 m)   Not recorded     Body mass index is 17.2 kg/m.  PHYSICAL EXAMNIATION:  Gen: NAD, conversant, well nourised, well groomed                     Cardiovascular: Regular rate rhythm, no peripheral edema, warm, nontender. Eyes: Conjunctivae clear without exudates or hemorrhage Neck: Supple, no carotid bruits. Pulmonary: Clear to auscultation bilaterally   NEUROLOGICAL EXAM:  MENTAL STATUS: Speech/cognition: Awake, alert, oriented to history taking and casual conversation   CRANIAL NERVES: CN II: Visual fields are full to confrontation. Pupils are round equal and briskly reactive to light. CN III, IV, VI: extraocular movement are normal. No ptosis. CN V: Facial sensation is intact to light touch CN VII: Face is symmetric with normal eye closure  CN VIII: Decreased hearing on casual conversation. CN IX, X: Phonation is normal. CN XI: Head turning and shoulder shrug are intact  MOTOR: Bilateral upper extremity and left lower extremity motor strength is normal, right lower extremity motor strength is limited by right knee pain,  REFLEXES: Reflexes are 2+ and symmetric at the biceps, triceps, 3 R/L2+ knees, and present at ankle ankles. Plantar responses are extensor bilaterally  SENSORY: Intact to light touch, pinprick and vibratory sensation are intact in fingers and toes.  COORDINATION: There is no trunk or limb dysmetria noted.  GAIT/STANCE: She needs push-up to get up from seated position, antalgic, variable effort on examinations, dragging her right leg  REVIEW OF SYSTEMS:  Full 14 system review of systems performed and notable only for as above All other review of systems were negative.   ALLERGIES: No Known Allergies  HOME MEDICATIONS: Current Outpatient Medications  Medication Sig  Dispense Refill   albuterol (VENTOLIN HFA) 108 (90 Base) MCG/ACT inhaler INHALE 2 PUFFS INTO THE LUNGS EVERY 6 HOURS AS NEEDED FOR WHEEZING (Patient taking differently: 1-2 puffs every 6 (six) hours as needed for wheezing or shortness of breath.) 8.5 g 1   bisacodyl (DULCOLAX) 5 MG EC tablet Take 1 tablet (5 mg total) by mouth daily as needed for moderate constipation. 30 tablet 0   cholecalciferol (VITAMIN D3) 25 MCG (1000 UNIT) tablet 1 tablet     lamoTRIgine (LAMICTAL) 100 MG tablet Take 1 tablet (100 mg total) by mouth 2 (two) times daily. 60 tablet 11   lisinopril (ZESTRIL) 10 MG tablet Take 1 tablet (10 mg total) by mouth daily. 90 tablet 0   oxyCODONE-acetaminophen (PERCOCET/ROXICET) 5-325 MG tablet Take 1 tablet by mouth every 6 (six) hours as needed for moderate pain. 60 tablet 0   No current facility-administered medications for this visit.    PAST MEDICAL HISTORY: Past Medical History:  Diagnosis Date   Asthma    Deafness in left ear    Homelessness    Hypertension    Sickle cell anemia ([place])     PAST SURGICAL HISTORY: Past Surgical History:  Procedure Laterality Date   CHOLECYSTECTOMY     EYE SURGERY     SPLENECTOMY, TOTAL      FAMILY HISTORY: Family History  Problem Relation Age of Onset   Sickle  cell trait Other     SOCIAL HISTORY: Social History   Socioeconomic History   Marital status: Single    Spouse name: Not on file   Number of children: Not on file   Years of education: Not on file   Highest education level: Not on file  Occupational History   Not on file  Tobacco Use   Smoking status: Former    Types: Cigarettes   Smokeless tobacco: Never  Vaping Use   Vaping Use: Never used  Substance and Sexual Activity   Alcohol use: No   Drug use: No   Sexual activity: Yes  Other Topics Concern   Not on file  Social History Narrative   Not on file   Social Determinants of Health   Financial Resource Strain: Not on file  Food Insecurity: Not on  file  Transportation Needs: Not on file  Physical Activity: Not on file  Stress: Not on file  Social Connections: Not on file  Intimate Partner Violence: Not on file      Marcial Pacas, M.D. Ph.D.  [facility] [address], [place], [place] [address] Ph: [phone] Fax: [phone]  CC:  Vevelyn Francois, NP [address] [place],  [place] [address]  Vevelyn Francois, NP

## 2022-01-25 ENCOUNTER — Other Ambulatory Visit: Payer: Self-pay

## 2022-01-25 ENCOUNTER — Ambulatory Visit (INDEPENDENT_AMBULATORY_CARE_PROVIDER_SITE_OTHER): Payer: Medicare Other | Admitting: Sports Medicine

## 2022-01-25 VITALS — BP 131/89 | Ht 69.0 in | Wt 117.0 lb

## 2022-01-25 DIAGNOSIS — R29898 Other symptoms and signs involving the musculoskeletal system: Secondary | ICD-10-CM

## 2022-01-25 DIAGNOSIS — R251 Tremor, unspecified: Secondary | ICD-10-CM

## 2022-01-25 DIAGNOSIS — G8929 Other chronic pain: Secondary | ICD-10-CM

## 2022-01-25 DIAGNOSIS — M25561 Pain in right knee: Secondary | ICD-10-CM

## 2022-01-25 LAB — LAMOTRIGINE LEVEL: Lamotrigine Lvl: 4 ug/mL (ref 2.0–20.0)

## 2022-01-25 NOTE — Progress Notes (Signed)
PCP: Vevelyn Francois, NP  Subjective:   HPI: Brittany Shannon is a pleasant 51 y.o. female here for follow-up of right knee pain/weakness and tremors.   Patient presents stating that her leg pain is slightly improved, although mostly the same.  She has been seeing neurology who performed imaging of the C-spine and the head which were unremarkable.  She has been placed on the medical twice daily and does state that this is helping decrease the severity of the tremor.  She does state that they are planning to perform an EEG on 01/31/2022 to evaluate for any abnormal brain activity.  She has since been placed on some pain medication, she takes Percocet once or twice daily as needed for pain.  Her pain is not as much in the knee but in the entire right extremity and worse after she has in her spasm episodes.  She does note weakness in the leg.  She had been doing physical therapy for a few sessions but then stopped for a while because of the intensity of the spasms/tremor.  Previous x-rays of knee 11/17/21: EXAM: RIGHT KNEE 3 VIEWS   COMPARISON:  None.   FINDINGS: No fracture or malalignment. 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 No sizable knee effusion.   IMPRESSION: Arthritis without acute osseous abnormality.   No Known Allergies  BP 131/89    Ht 5\' 9"  (1.753 m)    Wt 117 lb (53.1 kg)    LMP 04/07/2013 Comment: neg preg 11/03/16   BMI 17.28 kg/m   Independent review of:   Review of Cervical spine CT and CT-head on 01/12/22, independently reviewed: Narrative & Impression  CLINICAL DATA:  Weakness, leg tremors, unable to pick right leg up for 7-8 months after fall onto knees 04/20/2021   EXAM: CT CERVICAL SPINE WITHOUT CONTRAST   CT THORACIC SPINE WITHOUT CONTRAST   TECHNIQUE: Multidetector CT imaging of the cervical and thoracic spine was performed without contrast. Multiplanar CT image reconstructions were also generated.   RADIATION DOSE REDUCTION: This exam  was performed according to the departmental dose-optimization program which includes automated exposure control, adjustment of the mA and/or kV according to patient size and/or use of iterative reconstruction technique.   COMPARISON:  Two-view chest radiograph 11/03/2016   FINDINGS: CT CERVICAL SPINE FINDINGS   Alignment: Is straightening of the normal cervical spine lordosis with slight reversal centered at C4-C5. There is no antero or retrolisthesis. There is no jumped or perched facets or other evidence of traumatic malalignment.   Skull base and vertebrae: Skull base alignment is maintained. Vertebral body heights are preserved. There is no evidence of acute fracture. There is no suspicious osseous lesion.   Soft tissues and spinal canal: No prevertebral fluid or swelling. No visible canal hematoma. The palatine tonsils are prominent bilaterally with multiple small tonsilliths. There is no lymphadenopathy in the neck.   Disc levels: There is mild disc space narrowing at C5-C6. There is mild degenerative endplate change at F0-Y7 and C5-C6. The osseous spinal canal and neural foramina are patent.   Upper chest: There is emphysema in the lung apices.   Other: None.   CT THORACIC SPINE FINDINGS   Alignment: There is minimal dextrocurvature centered in the midthoracic spine. There is no antero or retrolisthesis. There is no jumped or perched facets or other evidence of traumatic malalignment.   Vertebrae: Vertebral body heights are preserved. There is no evidence of acute fracture. There is no suspicious osseous lesion.   Paraspinal and other  soft tissues: Negative.   Disc levels: The disc spaces are overall preserved. There is no significant degenerative change in the thoracic spine. The spinal canal and neural foramina appear patent.   IMPRESSION: 1. Minimal degenerative changes at C4-C5 and C5-C6. Otherwise, unremarkable cervical spine CT with no acute findings,  and no significant spinal canal or neural foraminal stenosis. 2. Unremarkable thoracic spine CT. 3. Mild centrilobular emphysema in the lung apices.        Objective:  Physical Exam:  Gen: Well-appearing, in no acute distress; non-toxic CV: Regular Rate. Well-perfused. Warm.  Resp: Breathing unlabored on room air; no wheezing. Psych: Fluid speech in conversation; appropriate affect; normal thought process Neuro: Sensation intact throughout. No gross coordination deficits.  MSK:   - Right lower extremity: Examination of the right lower extremity demonstrates decreased muscle tone and some atrophy of the right quadricep muscle compared to the left.  Diminished strength with hip flexion and abduction. There is some generalized TTP within the quad and knee although no specific joint line tenderness.  There is no effusion, ecchymosis or erythema about the knee.  Range of motion from 0 to 115 degrees.  When manipulating the knee, the patient does have tremoring of the right lower extremity.  This does appear to be somewhat distractible.  It does worsen when we are discussing the knee or when trying to manipulate the knee.  Calves are soft and nontender.  Neurovascular intact distally.   Assessment & Plan:  1. Right knee pain 2. RLE tremor 3. RLE atrophy and weakness  -The patient does have weakness of the hip and the knee with notable diminished muscle tone.  Discussed at this point with her she would continue to benefit from formalized physical therapy.  We also provided and demonstrated home exercises for hip and knee proprioception and strengthening/balancing.  She is to perform these at least once-twice daily. -Continue work-up with neurology and attempt to determine etiology of her tremor to rule out pathologic cause, although it does seem somewhat psychogenic in nature -She may continue to use the Percocet as needed for breakthrough pain as managed by her primary care physician -At this  point, we will leave follow-up as needed for the patient.  Would recommend following to completion with her neurologist and then seeing me for any resolving knee pain that is not related to her tremors -She may call or return with any questions  Elba Barman, DO PGY-4, [place]  Addendum:  I was the preceptor for this visit and available for immediate consultation.  Karlton Lemon MD [facility]

## 2022-01-26 ENCOUNTER — Ambulatory Visit: Payer: Self-pay | Admitting: Neurology

## 2022-01-31 ENCOUNTER — Ambulatory Visit (INDEPENDENT_AMBULATORY_CARE_PROVIDER_SITE_OTHER): Payer: Medicare Other | Admitting: Neurology

## 2022-01-31 DIAGNOSIS — R269 Unspecified abnormalities of gait and mobility: Secondary | ICD-10-CM

## 2022-01-31 DIAGNOSIS — M62838 Other muscle spasm: Secondary | ICD-10-CM

## 2022-01-31 DIAGNOSIS — R258 Other abnormal involuntary movements: Secondary | ICD-10-CM | POA: Diagnosis not present

## 2022-01-31 DIAGNOSIS — I639 Cerebral infarction, unspecified: Secondary | ICD-10-CM

## 2022-02-07 ENCOUNTER — Telehealth: Payer: Self-pay | Admitting: Neurology

## 2022-02-07 DIAGNOSIS — R9401 Abnormal electroencephalogram [EEG]: Secondary | ICD-10-CM

## 2022-02-07 DIAGNOSIS — R569 Unspecified convulsions: Secondary | ICD-10-CM

## 2022-02-07 DIAGNOSIS — I639 Cerebral infarction, unspecified: Secondary | ICD-10-CM

## 2022-02-07 NOTE — Telephone Encounter (Signed)
Pt called wanting to know when her EEG results will be given to her. Please advise.

## 2022-02-08 NOTE — Telephone Encounter (Signed)
Please call patient, EEG showed evidence of background slowing, indicating mild to moderate bihemispheric malfunction, ? ?There is no evidence of seizure activity ? ?I would like to further characterize her abnormal EEG with 72 hours ambulatory video EEG monitoring, ?

## 2022-02-08 NOTE — Telephone Encounter (Signed)
I spoke to the patient and provided her with the EEG results. She is agreeable to move forward with the 72-hr EEG. She is aware to expect a call for scheduling. ?

## 2022-02-08 NOTE — Procedures (Signed)
° °  HISTORY: 52 year old female, with intermittent right lower extremity jerking movement,  TECHNIQUE:  This is a routine 16 channel EEG recording with one channel devoted to a limited EKG recording.  It was performed during wakefulness, drowsiness and asleep.  Hyperventilation and photic stimulation were performed as activating procedures.  There are frequent electrode, muscle and eye movement artifact  Upon maximum arousal, posterior dominant waking rhythm consistent of dysrhythmic low amplitude theta range activity.  Activities are symmetric over the bilateral posterior derivations and attenuated with eye opening.  Hyperventilation was not performed,  Photic stimulation did not alter the tracing.    During EEG recording, patient developed drowsiness and no deeper stage of sleep was achieved During EEG recording, there was no epileptiform discharge noted.  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  There is no evidence of epileptiform discharge.  Marcial Pacas, M.D. Ph.D.  [facility] [place], [place] [address] Phone: [phone] Fax:      [phone]

## 2022-02-14 NOTE — Progress Notes (Signed)
This encounter was created in error - please disregard.

## 2022-02-15 DIAGNOSIS — R9401 Abnormal electroencephalogram [EEG]: Secondary | ICD-10-CM | POA: Insufficient documentation

## 2022-02-15 NOTE — Telephone Encounter (Signed)
Pt inquiring when EEG will be scheduled in her home. Would like a call back ?

## 2022-02-15 NOTE — Telephone Encounter (Signed)
Orders Placed This Encounter  ?Procedures  ? AMBULATORY EEG  ? ?I put in the order for ambulatory EEG, 72 hours as outpatient ? ?Please check to see if my order was placed correctly ?

## 2022-02-15 NOTE — Addendum Note (Signed)
Addended by: Marcial Pacas on: 02/15/2022 10:51 AM ? ? Modules accepted: Orders ? ?

## 2022-02-15 NOTE — Telephone Encounter (Signed)
Everything looks good on my end.  ? ?Its in your box to be signed.  ?

## 2022-02-15 NOTE — Addendum Note (Signed)
Addended by: Marcial Pacas on: 02/15/2022 03:49 PM ? ? Modules accepted: Orders ? ?

## 2022-02-16 NOTE — Telephone Encounter (Signed)
Sent to [facility], also sent Lovey Newcomer with [facility]  a message to let me know when she is scheduled.  ?

## 2022-02-22 NOTE — Telephone Encounter (Signed)
I got a message from [place] with [facility]: ? ?"I notified Brittany Shannon that since our reading neuro is out of network with her primary insurance plan ([employer]), her testing will have to be paid out of pocket which is $2500. I believe our Jenelle Mages spoke to Dr. April Manson during their most recent meeting, and they discussed the potential of Dr. April Manson to interpret and bill for these studies. If that is ever the case, we can bill through his NPI and all the patients he refers to [place] will be verified based on who he is in network with.  ? ?This is all to say that hopefully in the near future, we will be able to test all the patients you refer to [place]! Let me know if you have any questions. " ?

## 2022-02-23 NOTE — Telephone Encounter (Signed)
Yes, please have Brittany Shannon cancelled the appointment.

## 2022-02-23 NOTE — Telephone Encounter (Signed)
Hi Emily,  ?We have the meeting with [facility] last week and they wants [place] to sign a contract where we will rent their equipment. A decision has not been made yet.  ?Can you please sent the referral to [facility] with [facility]. Thanks

## 2022-02-24 ENCOUNTER — Ambulatory Visit: Payer: Medicare Other | Admitting: Nurse Practitioner

## 2022-02-28 ENCOUNTER — Encounter: Payer: Self-pay | Admitting: Neurology

## 2022-02-28 NOTE — Telephone Encounter (Signed)
Noted, Brittany Shannon has canceled her EEG appointment with them ([facility]). ? ?I have sent the order to [facility] and sent [name] a message asking if they have received the fax and when the patient will be scheduled.  ?

## 2022-03-08 ENCOUNTER — Ambulatory Visit (INDEPENDENT_AMBULATORY_CARE_PROVIDER_SITE_OTHER): Payer: Medicare Other | Admitting: Sports Medicine

## 2022-03-08 VITALS — BP 157/96 | Ht 69.0 in | Wt 120.0 lb

## 2022-03-08 DIAGNOSIS — G8929 Other chronic pain: Secondary | ICD-10-CM

## 2022-03-08 DIAGNOSIS — R29898 Other symptoms and signs involving the musculoskeletal system: Secondary | ICD-10-CM | POA: Diagnosis not present

## 2022-03-08 DIAGNOSIS — R251 Tremor, unspecified: Secondary | ICD-10-CM

## 2022-03-08 DIAGNOSIS — M25561 Pain in right knee: Secondary | ICD-10-CM

## 2022-03-08 MED ORDER — DICLOFENAC SODIUM 1 % EX GEL: 4.0000 g | Freq: Three times a day (TID) | CUTANEOUS | 0 refills | Status: AC

## 2022-03-08 NOTE — Progress Notes (Signed)
PCP: Vevelyn Francois, NP ? ?Subjective:  ? ?HPI: ?Brittany Shannon is a very pleasant 51 y.o. female here for follow-up of right knee pain and RLE tremor. ? ?She states that the right knee and leg continues to get somewhat better, although she is still having tremoring episodes.  He has stopped taking oxycodone, and is only taking Tylenol now as needed.  She continues with physical therapy twice weekly and does feel this is helping improve her range of motion and her strength of that leg. ? ?Upon chart review, she had an appointment with neurology and had an EEG performed on 01/31/2022 which did show mild to moderate slowing, indicating bilateral hemisphere malfunction, with a common etiology being metabolic toxic.  There was no evidence of epileptiform discharge. ? ?Brittany Shannon denies any new injuries.  She is wanting to try to get back into work.  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  She discussed that neurology was recommending certain restrictions and recommendations for work.  I discussed with her today that it is probably wise that she looks for jobs or school work that allow her to perform seated work only.  She is agreeable. ? ?Past Medical History:  ?Diagnosis Date  ? Asthma   ? Deafness in left ear   ? Homelessness   ? Hypertension   ? Sickle cell anemia (HCC)   ? ? ?Current Outpatient Medications on File Prior to Visit  ?Medication Sig Dispense Refill  ? albuterol (VENTOLIN HFA) 108 (90 Base) MCG/ACT inhaler INHALE 2 PUFFS INTO THE LUNGS EVERY 6 HOURS AS NEEDED FOR WHEEZING (Patient taking differently: 1-2 puffs every 6 (six) hours as needed for wheezing or shortness of breath.) 8.5 g 1  ? bisacodyl (DULCOLAX) 5 MG EC tablet Take 1 tablet (5 mg total) by mouth daily as needed for moderate constipation. 30 tablet 0  ? cholecalciferol (VITAMIN D3) 25 MCG (1000 UNIT) tablet 1 tablet    ? lamoTRIgine (LAMICTAL) 100 MG tablet Take 1 tablet  (100 mg total) by mouth 2 (two) times daily. 60 tablet 11  ? lisinopril (ZESTRIL) 10 MG tablet Take 1 tablet (10 mg total) by mouth daily. 90 tablet 0  ? oxyCODONE-acetaminophen (PERCOCET/ROXICET) 5-325 MG tablet Take 1 tablet by mouth every 6 (six) hours as needed for moderate pain. 60 tablet 0  ? ?No current facility-administered medications on file prior to visit.  ? ? ?Past Surgical History:  ?Procedure Laterality Date  ? CHOLECYSTECTOMY    ? EYE SURGERY    ? SPLENECTOMY, TOTAL    ? ? ?No Known Allergies ? ?BP (!) 157/96   Ht '5\' 9"'$  (1.753 m)   Wt 120 lb (54.4 kg)   LMP 04/07/2013 Comment: neg preg 11/03/16  BMI 17.72 kg/m?  ? ?   ? View : No data to display.  ?  ?  ?  ? ? ?   ? View : No data to display.  ?  ?  ?  ? ? ?    ?Objective:  ?Physical Exam: ? ?Gen: Well-appearing, in no acute distress; non-toxic ?CV: Regular Rate. Well-perfused. Warm.  ?Resp: Breathing unlabored on room air; no wheezing. ?Psych: Fluid speech in conversation; appropriate affect; normal thought process ?Neuro: Sensation intact throughout. No gross coordination deficits.  ?MSK:  ?- Right lower extremity: Mentation of the right lower extremity demonstrates mildly diminished muscle tone compared to the left  quadricep, although this does appear improved from our last visit.  There is no redness, erythema or effusion of the knee.  Range of motion of the knee from 0-120 degrees. Hip flexion 4/5 strength, unable to evaluate strength of the knee as resisted motion causes considerable tremoring of the lower extremity.  She does have patellar crepitus with extension.  Calves are soft and nontender.  Neurovascular intact distally. ?  ?Assessment & Plan:  ?1.  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  Right lower extremity tremor ?3.  Right lower extremity atrophy and weakness - improving ? ?I had a lengthy discussion with the patient regarding the etiology of her knee and leg issue.  We know based on x-rays that she does  have at least moderate patellofemoral joint arthritis.  We discussed doing a trial of topical Voltaren gel that she may use 3-4 times daily.  She may also use Tylenol as needed.  Discussed remaining in the knee brace for stability when she is ambulating.  At some point, we may want to transition to a simple knee compression sleeve for more maneuverability.  In terms of her tremor, discussed that she will continue to follow-up with neurology regarding this issue as I do not feel this is a musculoskeletal condition.  She does have an upcoming 72-hour EEG, so she will see what this shows and work with neurology regarding next steps.  I did ask her to check with neurology if they thought gabapentin might be helpful for her pain. She can continue physical therapy as long she continues to see benefit as I do see improvement in her strength and mobility.  We will leave follow-up on an as-needed basis from this standpoint. ? ?Brittany Barman, DO ?PGY-4, Sports Medicine Fellow ?[place] ? ?This note was dictated using Dragon naturally speaking software and may contain errors in syntax, spelling, or content which have not been identified prior to signing this note.  ? ?Addendum:  I was the preceptor for this visit and available for immediate consultation.  Karlton Lemon MD CAQSM ? ?

## 2022-03-08 NOTE — Patient Instructions (Signed)
It was great to see you again today! ? ?Today, we discussed your right knee pain and tremor. ?In terms of the knee pain, you may continue Tylenol.  We will try a topical medication called Voltaren gel 1%.  You may apply this over the painful area of the knee up to 3 times daily.  I would recommend continue to wear your knee brace when you are up and moving around. ? ?At this point, in terms of the tremoring and shaking of the leg, I feel it is best to continue to follow-up in neurology to figure out the cause of that.  1 thing you may be able to ask them about is a medication called gabapentin to see if they think this may be worth trying. ? ?If you have any further questions, please give the clinic a call [phone]. ? ?Cheers, ? ?Elba Barman, DO ?[place] ? ?

## 2022-03-10 ENCOUNTER — Telehealth: Payer: Self-pay

## 2022-03-10 NOTE — Telephone Encounter (Signed)
Patient contacted to schedule mammogram.  ? ?RE: Mobile Mammo event located at: ? ?[facility]  ?      ?[address]    ?[place] [id number]    ? ?Date: April 7th  ? ?Patient declined ?

## 2022-03-21 NOTE — Telephone Encounter (Signed)
Hi Brittany Shannon  ? ?Please have Dr. Krista Blue update the diagnosis (seizure disorder) and send a new referral.That should be enough. Let them know I will be reading the amb eeg (PC). Please let her know that I will be reading all the amb eeg from this office.

## 2022-03-21 NOTE — Telephone Encounter (Signed)
Noted, thank you. I sent a message to [place] to update her.  ?

## 2022-03-28 DIAGNOSIS — R569 Unspecified convulsions: Secondary | ICD-10-CM | POA: Diagnosis not present

## 2022-03-29 DIAGNOSIS — R569 Unspecified convulsions: Secondary | ICD-10-CM | POA: Diagnosis not present

## 2022-03-30 DIAGNOSIS — R569 Unspecified convulsions: Secondary | ICD-10-CM | POA: Diagnosis not present

## 2022-04-04 DIAGNOSIS — R569 Unspecified convulsions: Secondary | ICD-10-CM | POA: Insufficient documentation

## 2022-04-04 NOTE — Telephone Encounter (Signed)
I got a message from [place] with [facility]: ? ?"I hope all is well. Please let me know if you received my previous email regarding Ms. Guerry Bruin. She completed testing and we are waiting on an updated diagnosis code in the order and clinical notes to be able to bill for the study. Please let me know if there's anything I can do to help. " ? ?Can you put a new order in for the new diagnosis code for seizure disorder and I will send it to [place]. Thank you  ?

## 2022-04-04 NOTE — Addendum Note (Signed)
Addended by: Marcial Pacas on: 04/04/2022 03:42 PM ? ? Modules accepted: Orders ? ?

## 2022-04-04 NOTE — Telephone Encounter (Signed)
Okay, can you put a new order in with the new diagnosis code and I will fax the new order to [facility] to [facility].  ?

## 2022-04-04 NOTE — Telephone Encounter (Signed)
Orders Placed This Encounter  ?Procedures  ? AMBULATORY EEG  ? ?   ?

## 2022-04-05 NOTE — Telephone Encounter (Signed)
Noted, thank you fax the new order to [facility] to [place].  ?

## 2022-04-06 ENCOUNTER — Telehealth: Payer: Self-pay | Admitting: Neurology

## 2022-04-06 NOTE — Telephone Encounter (Signed)
Pt has asked that Dr Krista Blue be aware that her EEG started last [place]. And ended that Friday, pt would like a call with results when available. ?

## 2022-04-07 IMAGING — CT CT CERVICAL SPINE W/O CM
3 series · 8 of 14 positions shown, 9 images · non-contrast
Comparison: Two-view chest radiograph 11/03/2016

CLINICAL DATA: Weakness, leg tremors, unable to pick right leg up
for 7-8 months after fall onto knees 04/20/2021

EXAM:
CT CERVICAL SPINE WITHOUT CONTRAST
CT THORACIC SPINE WITHOUT CONTRAST
TECHNIQUE: Multidetector CT imaging of the cervical and thoracic spine was
performed without contrast. Multiplanar CT image reconstructions
were also generated.
RADIATION DOSE REDUCTION: This exam was performed according to the
departmental dose-optimization program which includes automated
exposure control, adjustment of the mA and/or kV according to
patient size and/or use of iterative reconstruction technique.

[Series 4: cspine soft · axial · 0.38mm/px · z∈[-138,-68]mm · 2 of 105 slices shown]
[im 35/105  soft-tissue]
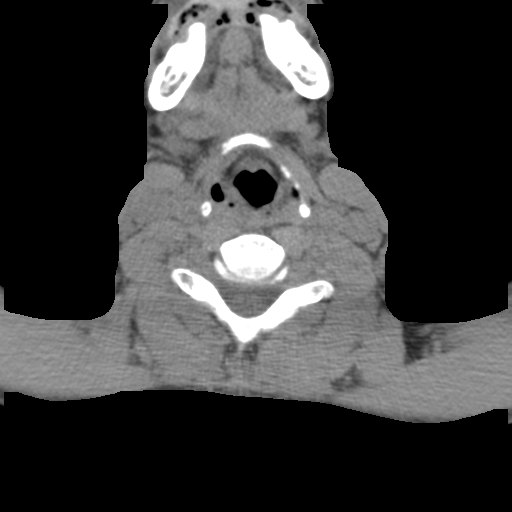
[im 70/105  soft-tissue]
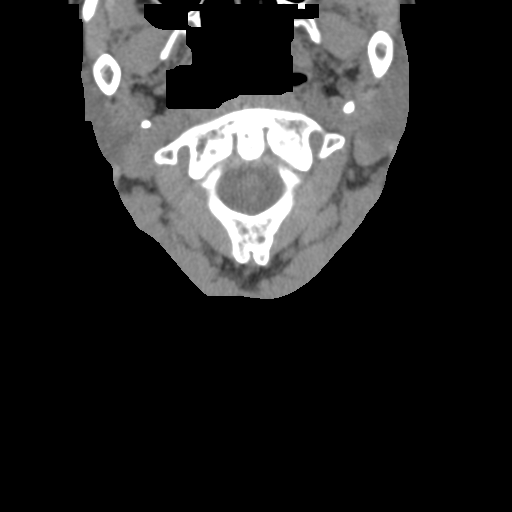

[Series 14: c spine bone · axial · 0.38mm/px · z∈[-152,-50]mm · 3 of 103 slices shown, 4 images]
[im 26/103  soft-tissue]
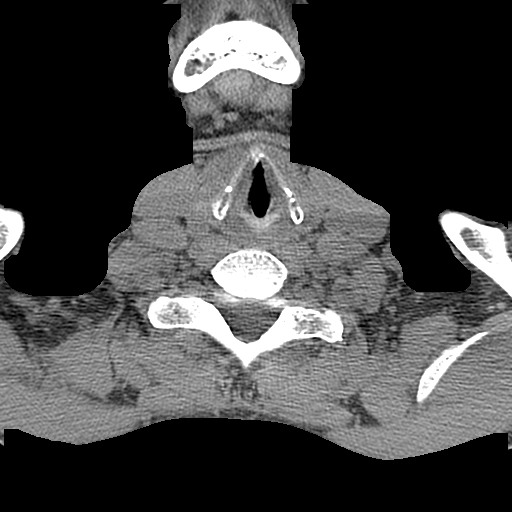
[im 26/103  bone]
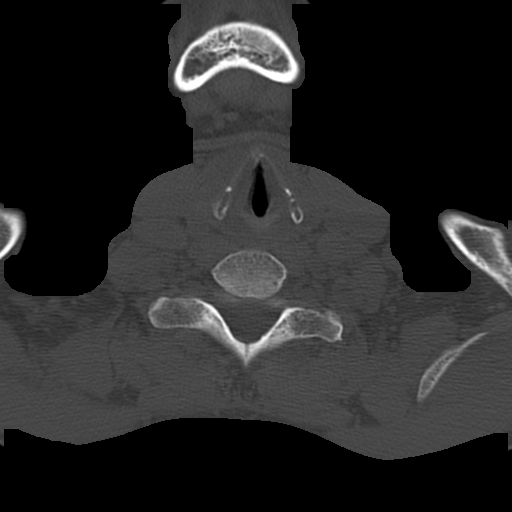
[im 52/103  bone]
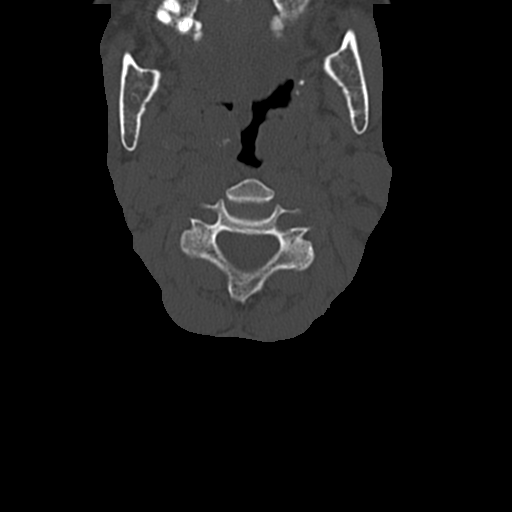
[im 77/103  bone]
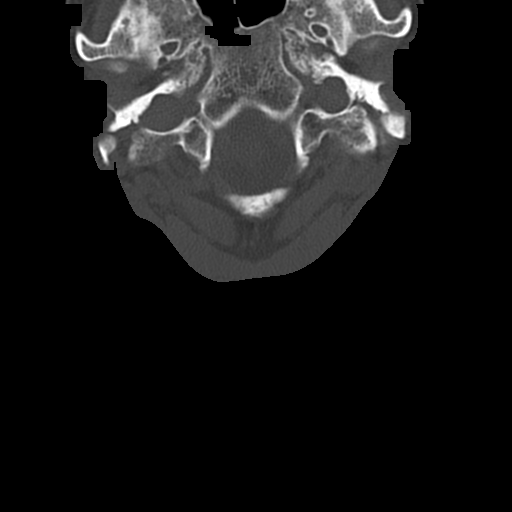

[Series 19: angled axial soft · axial · 0.24mm/px · z∈[-200,-98]mm · 3 of 112 slices shown]
[im 28/112  soft-tissue]
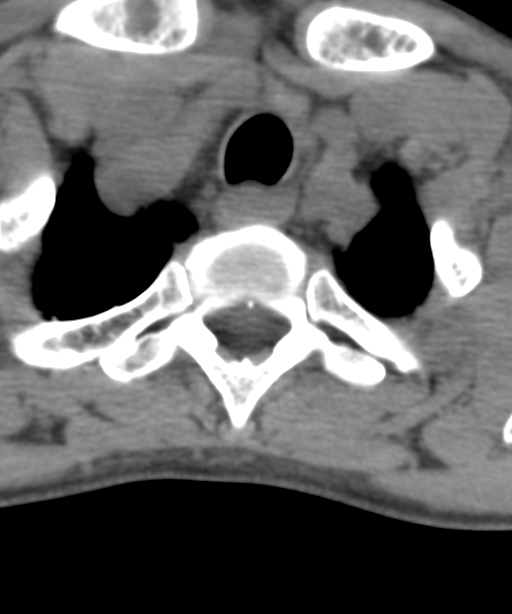
[im 56/112  soft-tissue]
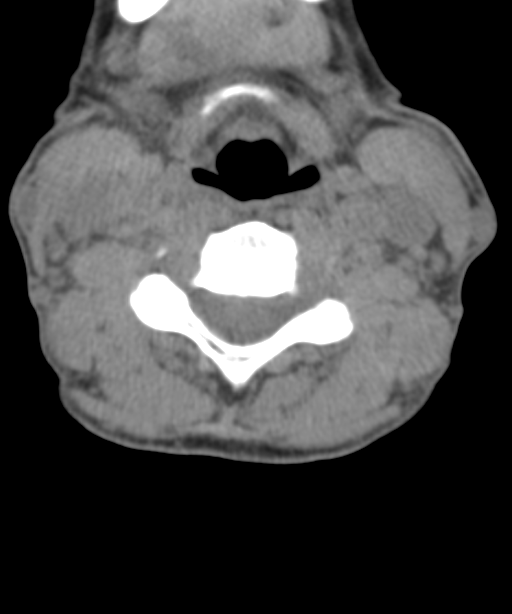
[im 84/112  soft-tissue]
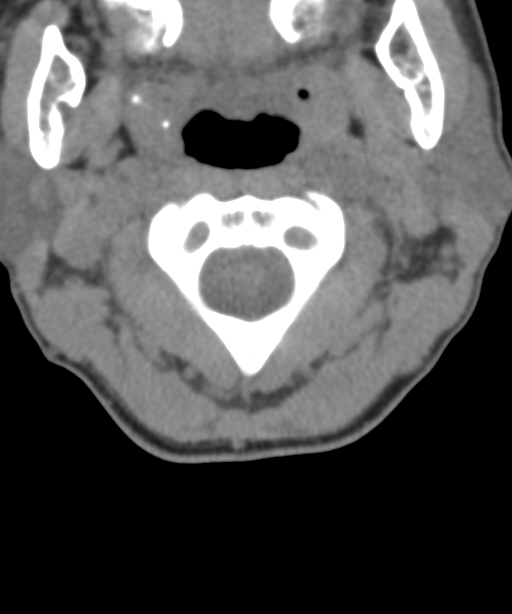

[8 of 14 positions shown; findings below may reference images not displayed]

FINDINGS: CT CERVICAL SPINE FINDINGS

Alignment: Is straightening of the normal cervical spine lordosis
with slight reversal centered at C4-C5. There is no antero or
retrolisthesis. There is no jumped or perched facets or other
evidence of traumatic malalignment.

Skull base and vertebrae: Skull base alignment is maintained.
Vertebral body heights are preserved. There is no evidence of acute
fracture. There is no suspicious osseous lesion.

Soft tissues and spinal canal: No prevertebral fluid or swelling. No
visible canal hematoma. The palatine tonsils are prominent
bilaterally with multiple small tonsilliths. There is no
lymphadenopathy in the neck.

Disc levels: There is mild disc space narrowing at C5-C6. There is
mild degenerative endplate change at C4-C5 and C5-C6. The osseous
spinal canal and neural foramina are patent.

Upper chest: There is emphysema in the lung apices.

Other: None.

CT THORACIC SPINE FINDINGS

Alignment: There is minimal dextrocurvature centered in the
midthoracic spine. There is no antero or retrolisthesis. There is no
jumped or perched facets or other evidence of traumatic
malalignment.

Vertebrae: Vertebral body heights are preserved. There is no
evidence of acute fracture. There is no suspicious osseous lesion.

Paraspinal and other soft tissues: Negative.

Disc levels: The disc spaces are overall preserved. There is no
significant degenerative change in the thoracic spine. The spinal
canal and neural foramina appear patent.
IMPRESSION: 1. Minimal degenerative changes at C4-C5 and C5-C6. Otherwise,
unremarkable cervical spine CT with no acute findings, and no
significant spinal canal or neural foraminal stenosis.
2. Unremarkable thoracic spine CT.
3. Mild centrilobular emphysema in the lung apices.

## 2022-04-13 ENCOUNTER — Encounter: Payer: Self-pay | Admitting: Neurology

## 2022-04-17 ENCOUNTER — Encounter: Payer: Self-pay | Admitting: Neurology

## 2022-04-17 DIAGNOSIS — R569 Unspecified convulsions: Secondary | ICD-10-CM

## 2022-04-17 NOTE — Procedures (Signed)
? ?  Description 72 Hours Ambulatory Video EEG  ?TECHNICAL DESCRIPTION: This AVEEG was performed using equipment provided by [employer] utilizing Bluetooth  (Trackit) amplifiers with continuous EEGT attended video collection using encrypted remote transmission via [place] secured cellular tower network with data rates for each AVEEG performed. This is a Biomedical engineer AVEEG, obtained, according to the 10-20 international electrode placement system, reformatted digitally into referential and bipolar montages. Data was acquired with a minimum of 21 bipolar connections and sampled at a minimum rate of 250 cycles per second per channel, maximum rate of 450 cycles per second per channel and two channels for EKG. The entire AVEEG study was recorded through cable and or radio telemetry for subsequent analysis. Specified epochs of the AVEEG data were identified at the direction of the subject by the depression of a push button by the patient. Each patient's event file included data acquired two minutes prior to the push button activation and continuing until two minutes afterwards. AVEEG files were reviewed on [place] by [employer] provided [employer] with a digital high frequency filter set at 70 Hz and a low frequency filter set at 0.1 Hz with a paper speed of 60m/s resulting in 10 seconds per digital page. This entire AVEEG was reviewed by the EEG Technologist. Random time samples, random sleep samples, clips, patient initiated push button files with included patient daily diary logs, EEG Technologist bookmarked note data was reviewed and verified for accuracy and validity by the governing reading neurologist in full details. This AEEGV was fully compliant with all requirements for CPT 97500 for setup, patient education, take down and administered by an EEG technologist.  ? ?INTERMITTENT MONITORING WITH VIDEO TECHNICAL DESCRIPTION:  This Long-Term AVEEG  was monitored intermittently by a qualified EEG technologist for the entirety of the recording; quality check-ins were performed at a minimum of every two hours, checking and documenting real-time data and video to assure the integrity and quality of the recording (e.g., camera position, electrode integrity and impedance), and identify the need for maintenance. For intermittent monitoring, an EEG Technologist monitored no more than 12 patients concurrently. Diagnostic video was captured at least 80% of the time during the recording. ? ?TECHNOLOGIST EVENTS: No epileptiform abnormalities were noted during the recording ? ?PATIENT EVENTS:  The patient pushed the event button  0  times during the AVEEG recording for further evaluation. However, EEGT test press  1  time with pt at the beginning of the tracing. ? ?TIME SAMPLES: 1 minute of every hour recorded are reviewed as random time samples.  ? ?SLEEP SAMPLES: 5 minutes of every 24 hour recorded sleep cycle are reviewed as random sleep samples.  ? ?BACKGROUND EEG: This AVEEG consists of well-modulated bilateral synchronous and symmetrical background in the alpha frequencies in the awake state. ? ?AWAKE: The posterior dominant rhythm was characterized by symmetric and reactive 8-9 Hz activity with eyes closed.  ? ?SLEEP: Stage two sleep displayed Sleep Spindles and Vertex Waveform components. All other sleep stages appeared symmetrical and synchronous with regards to K-Complexes, and REM. ? ?EKG: Normal sinus rhythm.  ? ?AVEEG  Interpretations: This is a normal 72 hours ambulatory EEG. No seizures, no events captured. A normal EEG does not exclude a diagnosis of epilepsy. ? ? ?AAlric Ran MD ?[facility] ? ? ?

## 2022-04-18 ENCOUNTER — Other Ambulatory Visit: Payer: Self-pay | Admitting: Nurse Practitioner

## 2022-04-18 ENCOUNTER — Encounter: Payer: Self-pay | Admitting: Neurology

## 2022-04-28 ENCOUNTER — Ambulatory Visit (INDEPENDENT_AMBULATORY_CARE_PROVIDER_SITE_OTHER): Payer: 59 | Admitting: Sports Medicine

## 2022-04-28 VITALS — BP 120/82 | Ht 69.0 in | Wt 125.0 lb

## 2022-04-28 DIAGNOSIS — M25561 Pain in right knee: Secondary | ICD-10-CM

## 2022-04-28 DIAGNOSIS — G8929 Other chronic pain: Secondary | ICD-10-CM | POA: Diagnosis not present

## 2022-04-28 DIAGNOSIS — R251 Tremor, unspecified: Secondary | ICD-10-CM

## 2022-04-28 DIAGNOSIS — R29898 Other symptoms and signs involving the musculoskeletal system: Secondary | ICD-10-CM | POA: Diagnosis not present

## 2022-04-28 MED ORDER — BACLOFEN 10 MG PO TABS: 10.0000 mg | ORAL_TABLET | Freq: Three times a day (TID) | ORAL | 0 refills | Status: AC

## 2022-04-28 NOTE — Progress Notes (Signed)
PCP: Vevelyn Francois, NP  Subjective:   HPI: Brittany Shannon is a very pleasant 51 y.o. female here for follow-up of right knee pain and right lower extremity tremoring.  She has been happy with the progress she has been making with physical therapy.  She continues this about twice weekly.  She is getting improvement in her flexion and extension.  She also feels like her strength is improving.  Her pain is quite mild, she still however does continue to have the tremoring of the right lower extremity.   She did have her follow-up appointment with neurology and they did a 72-hour EEG which did not show any seizure-like activity.  According to the patient, neurology does not think her tremoring is coming from the brain although they did not have any other advice or change in management at this time.  She does feel that the lamotrigine makes her feel somewhat foggy and off at times.  No Known Allergies  BP 120/82   Ht '5\' 9"'$  (1.753 m)   Wt 125 lb (56.7 kg)   LMP 04/07/2013 Comment: neg preg 11/03/16  BMI 18.46 kg/m       View : No data to display.              View : No data to display.              Objective:  Physical Exam:  Gen: Well-appearing, in no acute distress; non-toxic CV: Regular Rate. Well-perfused. Warm.  Resp: Breathing unlabored on room air; no wheezing. Psych: Fluid speech in conversation; appropriate affect; normal thought process Neuro: Sensation intact throughout. No gross coordination deficits.  MSK:   - Right lower extremity: Evaluation of the right lower extremity demonstrates mildly diminished muscle tone compared to the left quadricep, although this does appear improved from our last visit.  There is no redness, erythema or effusion of the knee.  Range of motion of the knee from 0-125 degrees. Knee flexion 4/5 strength.  She is able to actively extend and flex the knee to 0 and 125 degrees without resistance which she was unable to do at last visit. She does  have patellar crepitus with extension.  Calves are soft and nontender.  Neurovascular intact distally.   Assessment & Plan:  1.  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  We know she does have some patellofemoral DJD, but pain is not as much an issue at this standpoint.  She is doing well with the Voltaren gel and her knee brace.  She continues to have this right lower extremity tremor which has been worked up by neurology with multiple EEGs and they do not see evidence of seizure or epileptiform activity.  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  We will do a trial of baclofen 3 times daily to see if this helps improve the tremoring and/or the muscle spasm.  She does get some soreness after the muscle spasticity, so I am hoping this does help with her pain.  I suggested that she talks with neurology if she can come off of the lamotrigine due the fogginess side effect, as I do not think it is helping her tremoring but I would like her to discuss this with neurology  before coming off of it.  We will continue formalized physical therapy as I am seeing improvement in her range of motion and strengthening.  She will follow-up in a few months after she sees neurology in August.  She may call or return sooner if any issues arise.  Elba Barman, DO PGY-4, Sports Medicine Fellow [place]  This note was dictated using Dragon naturally speaking software and may contain errors in syntax, spelling, or content which have not been identified prior to signing this note.   I was the preceptor for this visit and available for immediate consultation Shellia Cleverly, DO

## 2022-04-28 NOTE — Patient Instructions (Signed)
Wynona, It was great to see you today, I am glad you are making some progress with the knee.  Today, we discussed options for your knee and the shaking/tremoring.  -We will try baclofen '10mg'$  --> take 1 tablet 2-3 times a day for the next 2 weeks.  If you are tolerating this well, you can increase to 2 tablets 2-3 times a day.  -Continue your physical therapy and exercises at home -I would call your neurologist and see if they think it is okay for you to discontinue your lamotrigine (Lamictal) -If you have any questions, feel free to give me a call.  Otherwise, I hope you have the best birthday! Enjoy eating that [place] Chocolate cake!  If you have any further questions, please give the clinic a call [phone].  Cheers,  Elba Barman, [place]

## 2022-05-04 ENCOUNTER — Emergency Department (HOSPITAL_COMMUNITY)
Admission: EM | Admit: 2022-05-04 | Discharge: 2022-05-04 | Disposition: A | Discharge status: Home / Self Care | Payer: 59 | Attending: Emergency Medicine | Admitting: Emergency Medicine

## 2022-05-04 ENCOUNTER — Other Ambulatory Visit: Payer: Self-pay

## 2022-05-04 DIAGNOSIS — Z79899 Other long term (current) drug therapy: Secondary | ICD-10-CM | POA: Insufficient documentation

## 2022-05-04 DIAGNOSIS — I10 Essential (primary) hypertension: Secondary | ICD-10-CM | POA: Diagnosis not present

## 2022-05-04 DIAGNOSIS — D5700 Hb-SS disease with crisis, unspecified: Secondary | ICD-10-CM | POA: Diagnosis not present

## 2022-05-04 DIAGNOSIS — J45909 Unspecified asthma, uncomplicated: Secondary | ICD-10-CM | POA: Insufficient documentation

## 2022-05-04 DIAGNOSIS — R112 Nausea with vomiting, unspecified: Secondary | ICD-10-CM | POA: Diagnosis present

## 2022-05-04 LAB — CBC WITH DIFFERENTIAL/PLATELET
Abs Immature Granulocytes: 0.04 10*3/uL (ref 0.00–0.07)
Basophils Absolute: 0 10*3/uL (ref 0.0–0.1)
Basophils Relative: 0 %
Eosinophils Absolute: 0 10*3/uL (ref 0.0–0.5)
Eosinophils Relative: 0 %
HCT: 29.4 % — ABNORMAL LOW (ref 36.0–46.0)
Hemoglobin: 11.6 g/dL — ABNORMAL LOW (ref 12.0–15.0)
Immature Granulocytes: 0 %
Lymphocytes Relative: 6 %
Lymphs Abs: 0.7 10*3/uL (ref 0.7–4.0)
MCH: 34.6 pg — ABNORMAL HIGH (ref 26.0–34.0)
MCHC: 39.5 g/dL — ABNORMAL HIGH (ref 30.0–36.0)
MCV: 87.8 fL (ref 80.0–100.0)
Monocytes Absolute: 0.7 10*3/uL (ref 0.1–1.0)
Monocytes Relative: 6 %
Neutro Abs: 10.6 10*3/uL — ABNORMAL HIGH (ref 1.7–7.7)
Neutrophils Relative %: 88 %
Platelets: 257 10*3/uL (ref 150–400)
RBC: 3.35 MIL/uL — ABNORMAL LOW (ref 3.87–5.11)
RDW: 15.1 % (ref 11.5–15.5)
WBC: 12.1 10*3/uL — ABNORMAL HIGH (ref 4.0–10.5)
nRBC: 1.1 % — ABNORMAL HIGH (ref 0.0–0.2)

## 2022-05-04 LAB — COMPREHENSIVE METABOLIC PANEL
ALT: 23 U/L (ref 0–44)
AST: 41 U/L (ref 15–41)
Albumin: 4.8 g/dL (ref 3.5–5.0)
Alkaline Phosphatase: 51 U/L (ref 38–126)
Anion gap: 14 (ref 5–15)
BUN: 10 mg/dL (ref 6–20)
CO2: 24 mmol/L (ref 22–32)
Calcium: 10.4 mg/dL — ABNORMAL HIGH (ref 8.9–10.3)
Chloride: 100 mmol/L (ref 98–111)
Creatinine, Ser: 0.73 mg/dL (ref 0.44–1.00)
GFR, Estimated: >60 mL/min (ref >60)
Glucose, Bld: 130 mg/dL — ABNORMAL HIGH (ref 70–99)
Potassium: 3.4 mmol/L — ABNORMAL LOW (ref 3.5–5.1)
Sodium: 138 mmol/L (ref 135–145)
Total Bilirubin: 3.1 mg/dL — ABNORMAL HIGH (ref 0.3–1.2)
Total Protein: 8.6 g/dL — ABNORMAL HIGH (ref 6.5–8.1)

## 2022-05-04 LAB — LIPASE, BLOOD: Lipase: 29 U/L (ref 11–51)

## 2022-05-04 LAB — LACTIC ACID, PLASMA: Lactic Acid, Venous: 1.4 mmol/L (ref 0.5–1.9)

## 2022-05-04 LAB — URINALYSIS, ROUTINE W REFLEX MICROSCOPIC
Bacteria, UA: NONE SEEN
Bilirubin Urine: NEGATIVE
Glucose, UA: NEGATIVE mg/dL
Hgb urine dipstick: NEGATIVE
Ketones, ur: NEGATIVE mg/dL
Nitrite: NEGATIVE
Protein, ur: >=300 mg/dL — AB
Specific Gravity, Urine: 1.015 (ref 1.005–1.030)
pH: 6 (ref 5.0–8.0)

## 2022-05-04 MED ORDER — OXYCODONE-ACETAMINOPHEN 5-325 MG PO TABS: 1.0000 | ORAL_TABLET | Freq: Four times a day (QID) | ORAL | 0 refills | Status: AC | PRN

## 2022-05-04 MED ORDER — MORPHINE SULFATE (PF) 4 MG/ML IV SOLN
6.0000 mg | INTRAVENOUS | Status: AC
  Administered 2022-05-04: 6 mg via INTRAVENOUS
  Filled 2022-05-04: qty 2

## 2022-05-04 MED ORDER — METOCLOPRAMIDE HCL 5 MG/ML IJ SOLN
10.0000 mg | Freq: Once | INTRAMUSCULAR | Status: AC
  Administered 2022-05-04: 10 mg via INTRAVENOUS
  Filled 2022-05-04: qty 2

## 2022-05-04 MED ORDER — DEXTROSE-NACL 5-0.45 % IV SOLN: INTRAVENOUS | Status: DC

## 2022-05-04 MED ORDER — METOCLOPRAMIDE HCL 10 MG PO TABS: 10.0000 mg | ORAL_TABLET | Freq: Four times a day (QID) | ORAL | 0 refills | Status: AC | PRN

## 2022-05-04 MED ORDER — MORPHINE SULFATE (PF) 4 MG/ML IV SOLN: 8.0000 mg | INTRAVENOUS | Status: AC

## 2022-05-04 NOTE — ED Provider Notes (Signed)
[facility] EMERGENCY DEPARTMENT Provider Note   CSN: [id number] Arrival date & time:        History  Chief Complaint  Patient presents with   Nausea    Brittany Shannon is a 51 y.o. female.  Patient is a 51 year old female with a history of sickle cell anemia, hypertension, asthma, chronic pain who typically takes Tylenol at home for pain and has recently started taking baclofen for muscle spasms who is presenting today with 2 complaints.  She reports yesterday when she woke up in the morning she was having pain in bilateral upper extremities which she describes as a muscle tightness and deep pain.  She denies any joint pain and reports this feels very much like her typical sickle cell crisis.  However last night she started having nausea and vomiting as well and vomited throughout the night and was unable to get any sleep.  She reports she was unable to hold any medications down and because she continued to have nausea and vomiting this morning she called 911.  She has not had any diarrhea, hematemesis or bloody stools.  No fevers.  She reports diffuse aching in her abdomen.  She does sometimes have vomiting with her sickle cell crisis but feels like it is related to the food she ate last night.  No known sick contacts.  Patient does use marijuana but denies alcohol use.  She does not have a history of frequent emesis.  The history is provided by the patient.      Home Medications Prior to Admission medications   Medication Sig Start Date End Date Taking? Authorizing Provider  metoCLOPramide (REGLAN) 10 MG tablet Take 1 tablet (10 mg total) by mouth every 6 (six) hours as needed for nausea or vomiting. 05/04/22  Yes Blanchie Dessert, MD  oxyCODONE-acetaminophen (PERCOCET/ROXICET) 5-325 MG tablet Take 1 tablet by mouth every 6 (six) hours as needed for severe pain. 05/04/22  Yes Linnie Mcglocklin, Loree Fee, MD  albuterol (VENTOLIN HFA) 108 (90 Base) MCG/ACT inhaler INHALE 2 PUFFS  INTO THE LUNGS EVERY 6 HOURS AS NEEDED FOR WHEEZING Patient taking differently: 1-2 puffs every 6 (six) hours as needed for wheezing or shortness of breath. 02/14/21   Vevelyn Francois, NP  baclofen (LIORESAL) 10 MG tablet Take 1 tablet (10 mg total) by mouth 3 (three) times daily. If tolerates this well after 2 weeks, May increase to 2 tablets ('20mg'$ ) three times a day. 04/28/22 05/28/22  Elba Barman, DO  bisacodyl (DULCOLAX) 5 MG EC tablet Take 1 tablet (5 mg total) by mouth daily as needed for moderate constipation. 12/24/21   Genia Harold, MD  cholecalciferol (VITAMIN D3) 25 MCG (1000 UNIT) tablet 1 tablet    [provider]  diclofenac Sodium (VOLTAREN) 1 % GEL Apply 4 g topically in the morning, at noon, and at bedtime. 03/08/22   Elba Barman, DO  lamoTRIgine (LAMICTAL) 100 MG tablet Take 1 tablet (100 mg total) by mouth 2 (two) times daily. 12/20/21   Marcial Pacas, MD  lisinopril (ZESTRIL) 10 MG tablet TAKE 1 TABLET(10 MG) BY MOUTH DAILY 04/18/22   Bo Merino I, NP      Allergies    Patient has no known allergies.    Review of Systems   Review of Systems  Physical Exam Updated Vital Signs BP (!) 156/90   Pulse 75   Temp 98.3 F (36.8 C) (Oral)   Resp 17   Ht '5\' 9"'$  (1.753 m)   Wt 56.7 kg  LMP 04/07/2013 Comment: neg preg 11/03/16  SpO2 97%   BMI 18.46 kg/m  Physical Exam Vitals and nursing note reviewed.  Constitutional:      General: She is not in acute distress.    Appearance: She is well-developed.  HENT:     Head: Normocephalic and atraumatic.  Eyes:     Pupils: Pupils are equal, round, and reactive to light.  Cardiovascular:     Rate and Rhythm: Normal rate and regular rhythm.     Pulses: Normal pulses.     Heart sounds: Normal heart sounds. No murmur heard.   No friction rub.  Pulmonary:     Effort: Pulmonary effort is normal.     Breath sounds: Normal breath sounds. No wheezing or rales.  Abdominal:     General: Bowel sounds are normal. There is no  distension.     Palpations: Abdomen is soft.     Tenderness: There is abdominal tenderness. There is no right CVA tenderness, left CVA tenderness, guarding or rebound.     Comments: Moderate diffuse tenderness in all quadrants.  No rebound or guarding  Musculoskeletal:        General: Tenderness present. Normal range of motion.     Cervical back: Normal range of motion.     Right lower leg: No edema.     Left lower leg: No edema.     Comments: Pain with palpation of the upper ext but no joint swelling or erythema.  No edema  Skin:    General: Skin is warm and dry.     Coloration: Skin is pale.     Findings: No rash.  Neurological:     Mental Status: She is alert and oriented to person, place, and time. Mental status is at baseline.     Cranial Nerves: No cranial nerve deficit.  Psychiatric:        Mood and Affect: Mood normal.        Behavior: Behavior normal.    ED Results / Procedures / Treatments   Labs (all labs ordered are listed, but only abnormal results are displayed) Labs Reviewed  COMPREHENSIVE METABOLIC PANEL - Abnormal; Notable for the following components:      Result Value   Potassium 3.4 (*)    Glucose, Bld 130 (*)    Calcium 10.4 (*)    Total Protein 8.6 (*)    Total Bilirubin 3.1 (*)    All other components within normal limits  CBC WITH DIFFERENTIAL/PLATELET - Abnormal; Notable for the following components:   WBC 12.1 (*)    RBC 3.35 (*)    Hemoglobin 11.6 (*)    HCT 29.4 (*)    MCH 34.6 (*)    MCHC 39.5 (*)    nRBC 1.1 (*)    Neutro Abs 10.6 (*)    All other components within normal limits  URINALYSIS, ROUTINE W REFLEX MICROSCOPIC - Abnormal; Notable for the following components:   APPearance HAZY (*)    Protein, ur >=300 (*)    Leukocytes,Ua TRACE (*)    All other components within normal limits  LIPASE, BLOOD  LACTIC ACID, PLASMA  LACTIC ACID, PLASMA    EKG EKG Interpretation  Date/Time:  Thursday May 04 2022 08:12:47 EDT Ventricular  Rate:  73 PR Interval:  153 QRS Duration: 80 QT Interval:  533 QTC Calculation: 567 R Axis:   80 Text Interpretation: Sinus rhythm Paired ventricular premature complexes Aberrant conduction of SV complex(es) LVH with secondary repolarization abnormality Anterior  Q waves, possibly due to LVH Prolonged QT interval Confirmed by Blanchie Dessert [phone]) on 05/04/2022 8:21:02 AM  Radiology No results found.  Procedures Procedures    Medications Ordered in ED Medications  dextrose 5 %-0.45 % sodium chloride infusion ( Intravenous New Bag/Given 05/04/22 0813)  morphine (PF) 4 MG/ML injection 8 mg (has no administration in time range)  morphine (PF) 4 MG/ML injection 6 mg (6 mg Intravenous Given 05/04/22 0813)  metoCLOPramide (REGLAN) injection 10 mg (10 mg Intravenous Given 05/04/22 7622)    ED Course/ Medical Decision Making/ A&P                           Medical Decision Making Amount and/or Complexity of Data Reviewed Independent Historian: EMS External Data Reviewed: notes.    Details: from recent doctor visit Labs: ordered. Decision-making details documented in ED Course. ECG/medicine tests: ordered and independent interpretation performed. Decision-making details documented in ED Course.  Risk Prescription drug management.   Pt with multiple medical problems and comorbidities and presenting today with a complaint that caries a high risk for morbidity and mortality.  Presenting today with upper extremity pain as well as nausea vomiting and diffuse abdominal pain.  Patient has no respiratory complaints at this time is satting 95% on room air and in no respiratory distress.  She does have diffuse abdominal pain but no guarding or rebound and low suspicion for peritonitis.  Could be foodborne in nature however could also be related to sickle cell pain crisis.  Low suspicion for hepatitis, pancreatitis, appendicitis or diverticulitis.  Patient denies any urinary symptoms.  Patient given  IV pain medication and started on D5 half-normal saline drip as concern for component of dehydration with frequent vomiting.  Will check EKG to ensure it is safe to give Zofran.  Patient is not on narcotics regularly and was started on the sickle cell pain crisis order set with morphine for nave user.  10:43 AM After 1 round of morphine and reglan pt is feeling better.  She is drinking water and reports the abd pain and nausea has resolved.  I independently interpreted patient's labs today and her CMP with minimal hypokalemia of 3.4 and elevated total bilirubin at 3.1 which is her baseline, CBC with stable hemoglobin of 11 and mild leukocytosis of 12 most likely related to her pain crisis, LFTs and lipase are within normal limits, UA without evidence for infection, EKG showed pretty frequent PVCs and nonspecific ST changes but no new findings.  No indication for admission at this time. Pt requesting some pain meds and antiemetics for home and encouraged to f/u with sickle cell clinic or return for worsening symptoms. Pt last filled oxycodone '5mg'$  in February.  She was given a short course.        Final Clinical Impression(s) / ED Diagnoses Final diagnoses:  Sickle cell pain crisis (HCC)  Nausea and vomiting, unspecified vomiting type    Rx / DC Orders ED Discharge Orders          Ordered    oxyCODONE-acetaminophen (PERCOCET/ROXICET) 5-325 MG tablet  Every 6 hours PRN        05/04/22 1041    metoCLOPramide (REGLAN) 10 MG tablet  Every 6 hours PRN        05/04/22 1041              Blanchie Dessert, MD 05/04/22 1043

## 2022-05-04 NOTE — ED Triage Notes (Signed)
Hasn't been feeling well since after dinner last  night. N/v. Multiple episodes of vomiting. Hx sickle cell. Pain in shoulder and elbows.

## 2022-05-04 NOTE — Discharge Instructions (Signed)
Take the pain medication as needed for pain and the nausea medication.  Return if you start having fever, worsening vomiting or more severe pain

## 2022-06-07 ENCOUNTER — Ambulatory Visit (INDEPENDENT_AMBULATORY_CARE_PROVIDER_SITE_OTHER): Payer: 59 | Admitting: Sports Medicine

## 2022-06-07 VITALS — BP 187/111 | Ht 69.0 in | Wt 113.0 lb

## 2022-06-07 DIAGNOSIS — R251 Tremor, unspecified: Secondary | ICD-10-CM | POA: Diagnosis not present

## 2022-06-07 DIAGNOSIS — G8929 Other chronic pain: Secondary | ICD-10-CM

## 2022-06-07 DIAGNOSIS — M25561 Pain in right knee: Secondary | ICD-10-CM | POA: Diagnosis not present

## 2022-06-07 DIAGNOSIS — M25551 Pain in right hip: Secondary | ICD-10-CM | POA: Diagnosis not present

## 2022-06-07 MED ORDER — GABAPENTIN 100 MG PO CAPS: 100.0000 mg | ORAL_CAPSULE | Freq: Two times a day (BID) | ORAL | 1 refills | Status: DC

## 2022-06-07 NOTE — Progress Notes (Signed)
PCP: Vevelyn Francois, NP  Subjective:   HPI: Brittany Shannon is a very pleasant 51 y.o. female here for follow-up of right knee pain and leg pain.  Patient has a known history of knee osteoarthritis as well as a right lower extremity tremor of unknown etiology.  Has been continuing physical therapy twice a week, although she states that about 2 weeks ago she was walking on some rocks and felt a little unsteady and this aggravated her knee and her leg.  She gets pain more so in the medial side of the knee but extends of the thigh into the hip.  Is getting some more buckling of the knee.  She is having some intermittent swelling of the knee as well she has been taking Advil for her pain control.  She also has been out of her blood pressure medications and needs to get restarted on those with her primary physician.  The tremors are at [place] or even slightly better than her baseline, however she is having this new pain of the hip and thinks this is affecting how she is walking.  Denies any fever chills or signs of infection.   BP (!) 187/111   Ht '5\' 9"'$  (1.753 m)   Wt 113 lb (51.3 kg)   LMP 04/07/2013 Comment: neg preg 11/03/16  BMI 16.69 kg/m       No data to display              No data to display              Objective:  Physical Exam:  Gen: Well-appearing, in no acute distress; non-toxic CV: Regular Rate. Well-perfused. Warm.  Resp: Breathing unlabored on room air; no wheezing. Psych: Fluid speech in conversation; appropriate affect; normal thought process Neuro: Sensation intact throughout. No gross coordination deficits.  MSK:  - Right knee: Inspection of the right knee demonstrates mild soft tissue swelling versus trace effusion.  There is some medial joint space TTP.  Range of motion from 0-115 degrees but with some guarding.  She has improved knee flexion and extension.  There is still some mild tremoring of the right lower extremity although improved from prior visits.  Some  patellar crepitus with extension.  Calves are soft and nontender bilaterally.  - Right hip: No specific ASIS or greater trochanteric TTP.  Inspection yields no erythema or swelling.  There is pain with hip flexion, mild restriction in internal rotation to about 20 degrees and external rotation about 35 degrees.  Mild pain with passive logroll.     Assessment & Plan:  1. 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 New-onset right hip pain - irritable hip on exam 3. 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 HTN - uncontrolled today - out of medicine currently  I had a good discussion with Ms. Criger today regarding her right lower extremity pain.  At this point, I feel it is best that she rest from physical therapy for the next 2 weeks.  She can continue ice, recommended Tylenol as opposed to anti-inflammatories and her blood pressure is under better control.  She will follow-up with her primary care physician for this.  I do wonder if some of her pain is coming from the hip joint, as this is somewhat irritable on exam today.  She is also having some burning sensation within the thigh.  We will start her on gabapentin 100 mg twice daily to see how  this helps with her pain.  We will likely uptitrate this if she is tolerating it well.  I will message her after her hip x-rays to discuss next steps.  We will follow-up in a few months depending on these results.  Elba Barman, DO PGY-4, Sports Medicine Fellow [place]  This note was dictated using Dragon naturally speaking software and may contain errors in syntax, spelling, or content which have not been identified prior to signing this note.   Addendum:  I was the preceptor for this visit and available for immediate consultation.  Karlton Lemon MD [facility]

## 2022-06-07 NOTE — Patient Instructions (Signed)
Brittany Shannon - It was great to see you again today, we will hopefully get this knee and leg feeling better.  Things for you to do: -Get the x-ray of your right hip, I will message about this once it comes back -I want you taking a break from physical therapy for the next 2 weeks, ice the knee for 15-20 minutes twice a day -We will also start gabapentin 100 mg.  You will take 1 tablet at nighttime for a few days, and if you are tolerating this well you may increase to taking 1 tablet twice a day every day  You will follow-up as needed.  If you have any further questions, please give the clinic a call [phone].  Cheers,  Elba Barman, [place]

## 2022-06-08 ENCOUNTER — Ambulatory Visit
Admission: RE | Admit: 2022-06-08 | Discharge: 2022-06-08 | Disposition: A | Discharge status: Home / Self Care | Payer: 59 | Source: Ambulatory Visit | Attending: Sports Medicine | Admitting: Sports Medicine

## 2022-06-08 DIAGNOSIS — M25551 Pain in right hip: Secondary | ICD-10-CM

## 2022-06-20 ENCOUNTER — Other Ambulatory Visit: Payer: Self-pay

## 2022-06-20 ENCOUNTER — Encounter (HOSPITAL_COMMUNITY): Payer: Self-pay

## 2022-06-20 ENCOUNTER — Emergency Department (HOSPITAL_COMMUNITY)
Admission: EM | Admit: 2022-06-20 | Discharge: 2022-06-23 | Disposition: A | Discharge status: Other Acute Inpatient Hospital | Payer: 59 | Attending: Emergency Medicine | Admitting: Emergency Medicine

## 2022-06-20 DIAGNOSIS — F1210 Cannabis abuse, uncomplicated: Secondary | ICD-10-CM

## 2022-06-20 DIAGNOSIS — F22 Delusional disorders: Secondary | ICD-10-CM | POA: Diagnosis not present

## 2022-06-20 DIAGNOSIS — I10 Essential (primary) hypertension: Secondary | ICD-10-CM | POA: Insufficient documentation

## 2022-06-20 DIAGNOSIS — F29 Unspecified psychosis not due to a substance or known physiological condition: Secondary | ICD-10-CM

## 2022-06-20 DIAGNOSIS — J45909 Unspecified asthma, uncomplicated: Secondary | ICD-10-CM | POA: Insufficient documentation

## 2022-06-20 DIAGNOSIS — R443 Hallucinations, unspecified: Secondary | ICD-10-CM

## 2022-06-20 DIAGNOSIS — Z20822 Contact with and (suspected) exposure to covid-19: Secondary | ICD-10-CM | POA: Insufficient documentation

## 2022-06-20 DIAGNOSIS — Z79899 Other long term (current) drug therapy: Secondary | ICD-10-CM | POA: Insufficient documentation

## 2022-06-20 LAB — CBC WITH DIFFERENTIAL/PLATELET
Abs Immature Granulocytes: 0.04 10*3/uL (ref 0.00–0.07)
Basophils Absolute: 0.1 10*3/uL (ref 0.0–0.1)
Basophils Relative: 1 %
Eosinophils Absolute: 0.1 10*3/uL (ref 0.0–0.5)
Eosinophils Relative: 1 %
HCT: 28.2 % — ABNORMAL LOW (ref 36.0–46.0)
Hemoglobin: 10.4 g/dL — ABNORMAL LOW (ref 12.0–15.0)
Immature Granulocytes: 0 %
Lymphocytes Relative: 22 %
Lymphs Abs: 2.7 10*3/uL (ref 0.7–4.0)
MCH: 33.1 pg (ref 26.0–34.0)
MCHC: 36.9 g/dL — ABNORMAL HIGH (ref 30.0–36.0)
MCV: 89.8 fL (ref 80.0–100.0)
Monocytes Absolute: 1.2 10*3/uL — ABNORMAL HIGH (ref 0.1–1.0)
Monocytes Relative: 9 %
Neutro Abs: 8.2 10*3/uL — ABNORMAL HIGH (ref 1.7–7.7)
Neutrophils Relative %: 67 %
Platelets: 324 10*3/uL (ref 150–400)
RBC: 3.14 MIL/uL — ABNORMAL LOW (ref 3.87–5.11)
RDW: 15.8 % — ABNORMAL HIGH (ref 11.5–15.5)
WBC: 12.3 10*3/uL — ABNORMAL HIGH (ref 4.0–10.5)
nRBC: 0.6 % — ABNORMAL HIGH (ref 0.0–0.2)

## 2022-06-20 LAB — RAPID URINE DRUG SCREEN, HOSP PERFORMED
Amphetamines: NOT DETECTED
Barbiturates: NOT DETECTED
Benzodiazepines: NOT DETECTED
Cocaine: NOT DETECTED
Opiates: NOT DETECTED
Tetrahydrocannabinol: POSITIVE — AB

## 2022-06-20 LAB — ETHANOL: Alcohol, Ethyl (B): <10 mg/dL (ref <10)

## 2022-06-20 LAB — COMPREHENSIVE METABOLIC PANEL
ALT: 25 U/L (ref 0–44)
AST: 40 U/L (ref 15–41)
Albumin: 4.5 g/dL (ref 3.5–5.0)
Alkaline Phosphatase: 59 U/L (ref 38–126)
Anion gap: 12 (ref 5–15)
BUN: 15 mg/dL (ref 6–20)
CO2: 25 mmol/L (ref 22–32)
Calcium: 10 mg/dL (ref 8.9–10.3)
Chloride: 100 mmol/L (ref 98–111)
Creatinine, Ser: 0.89 mg/dL (ref 0.44–1.00)
GFR, Estimated: >60 mL/min (ref >60)
Glucose, Bld: 103 mg/dL — ABNORMAL HIGH (ref 70–99)
Potassium: 3.6 mmol/L (ref 3.5–5.1)
Sodium: 137 mmol/L (ref 135–145)
Total Bilirubin: 3 mg/dL — ABNORMAL HIGH (ref 0.3–1.2)
Total Protein: 8.3 g/dL — ABNORMAL HIGH (ref 6.5–8.1)

## 2022-06-20 LAB — I-STAT BETA HCG BLOOD, ED (MC, WL, AP ONLY): I-stat hCG, quantitative: <5 m[IU]/mL (ref <5)

## 2022-06-20 MED ORDER — ALBUTEROL SULFATE HFA 108 (90 BASE) MCG/ACT IN AERS: 1.0000 | INHALATION_SPRAY | Freq: Four times a day (QID) | RESPIRATORY_TRACT | Status: DC | PRN

## 2022-06-20 MED ORDER — ALBUTEROL SULFATE (2.5 MG/3ML) 0.083% IN NEBU
2.5000 mg | INHALATION_SOLUTION | Freq: Four times a day (QID) | RESPIRATORY_TRACT | Status: DC | PRN
Start: 2022-06-20 — End: 2022-06-23

## 2022-06-20 MED ORDER — LISINOPRIL 10 MG PO TABS
10.0000 mg | ORAL_TABLET | Freq: Every day | ORAL | Status: DC
  Administered 2022-06-21 – 2022-06-22 (×2): 10 mg via ORAL
  Filled 2022-06-20 (×2): qty 1

## 2022-06-20 MED ORDER — IBUPROFEN 200 MG PO TABS
200.0000 mg | ORAL_TABLET | Freq: Once | ORAL | Status: AC
  Administered 2022-06-20: 200 mg via ORAL
  Filled 2022-06-20: qty 1

## 2022-06-20 MED ORDER — LAMOTRIGINE 100 MG PO TABS
100.0000 mg | ORAL_TABLET | Freq: Two times a day (BID) | ORAL | Status: DC
  Administered 2022-06-21 – 2022-06-22 (×5): 100 mg via ORAL
  Filled 2022-06-20 (×5): qty 1

## 2022-06-20 MED ORDER — BISACODYL 5 MG PO TBEC: 5.0000 mg | DELAYED_RELEASE_TABLET | Freq: Every day | ORAL | Status: DC | PRN

## 2022-06-20 MED ORDER — GABAPENTIN 100 MG PO CAPS
100.0000 mg | ORAL_CAPSULE | Freq: Two times a day (BID) | ORAL | Status: DC
  Administered 2022-06-21 – 2022-06-22 (×5): 100 mg via ORAL
  Filled 2022-06-20 (×5): qty 1
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
## 2022-06-20 NOTE — ED Provider Notes (Signed)
Voluntary  Hallucinating  Physical Exam  BP (!) 168/119 (BP Location: Right Arm)   Pulse (!) 101   Temp 98.5 F (36.9 C) (Oral)   Resp 16   LMP 04/07/2013 Comment: neg preg 11/03/16  SpO2 100%   Physical Exam  Procedures  Procedures  ED Course / MDM    Medical Decision Making Risk OTC drugs.   Pending TTS

## 2022-06-21 DIAGNOSIS — F29 Unspecified psychosis not due to a substance or known physiological condition: Secondary | ICD-10-CM

## 2022-06-21 DIAGNOSIS — F22 Delusional disorders: Secondary | ICD-10-CM | POA: Diagnosis not present

## 2022-06-21 DIAGNOSIS — F1210 Cannabis abuse, uncomplicated: Secondary | ICD-10-CM

## 2022-06-21 LAB — RESP PANEL BY RT-PCR (FLU A&B, COVID) ARPGX2
Influenza A by PCR: NEGATIVE
Influenza B by PCR: NEGATIVE
SARS Coronavirus 2 by RT PCR: NEGATIVE

## 2022-06-21 MED ORDER — OLANZAPINE 5 MG PO TABS
5.0000 mg | ORAL_TABLET | Freq: Every day | ORAL | Status: DC
  Administered 2022-06-21 – 2022-06-22 (×2): 5 mg via ORAL
  Filled 2022-06-21 (×2): qty 1
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
## 2022-06-21 NOTE — BH Assessment (Signed)
Clinician messaged [name], RN: "Hey. It's Trey with TTS. Is the pt able to engage in the assessment, if so the pt will need to be placed in a private room. Is the pt under IVC? Also is the pt medically cleared?"   Clinician awaiting response.    Vertell Novak, [place], [facility], [facility] Triage Specialist [phone]

## 2022-06-21 NOTE — Progress Notes (Signed)
Patient has been denied by [facility] due to no appropriate beds available. Patient meets [place] inpatient criteria per Vesta Mixer, NP. Patient has been faxed out to the following facilities:    [place] [place] [address] [phone]  [facility]  [address] [place] [address] [phone] [facility]  [place], [place] [address] [place]  [facility]  [address] [place] [address] [phone] [place]  [place], [place] [address] [phone]  [facility]  [address] [place] [address] [phone]  [facility]  [address] [place] [address] [place]  [facility]  [address], [place] [address] [phone]  [facility]  [address] [place] [address] [phone]  [facility]  [address], [place] [address] [phone] [facility]  [address], [place]  [address] [phone]  [facility]  [address] [place] [address] [phone] [facility]  [address], [place] [address] [phone] [place], [place] [address] [place]   Mariea Clonts, MSW, LCSW-A  8:50 PM 06/21/2022

## 2022-06-21 NOTE — Progress Notes (Signed)
Inpatient Behavioral Health Placement  Pt meets inpatient criteria per Vesta Mixer, NP. There are no available beds at [facility] per [facility] Lynnda Shields, RN. Referral was sent to the following facilities;   Destination Service Provider Address Phone Fax  [place] [place] [address] [phone] [facility]  [address] [place] [address] [phone] [facility]  [place], [place] [address] [place]  [facility]  [address] [place] [address] [phone]  [facility]  [place], [place] [address] [phone]  [facility]  [place] [address] [place] [address] [phone]  [facility]  [address] [place] [address] [place]  [facility]  [address] [place] [address] [phone]  [facility]  [address] [place] [address] [phone]  [facility]  [address], [place] [address] [phone] [facility]  [address], [place] [address] [phone]  [facility]  [address] [place] [address] [phone] [facility]  [address], [place] [address] [phone] [place], [place] [address] [phone]    Situation ongoing,  CSW will follow up.   Benjaman Kindler, MSW, LCSWA 06/21/2022  @ 2:25 PM

## 2022-06-21 NOTE — Consult Note (Signed)
[facility] ED ASSESSMENT   Reason for Consult:  delusions Referring Physician:  Beverely Risen, PA Patient Identification: Brittany Shannon MRN:  [id number] ED Chief Complaint: Psychosis [facility])  Diagnosis:  Principal Problem:   Psychosis ([place]) Active Problems:   Cannabis abuse   ED Assessment Time Calculation: Start Time: 1100 Stop Time: 1130 Total Time in Minutes (Assessment Completion): 30   Subjective:   Brittany Shannon is a 51 y.o. female patient who originally presented to [facility] office saying she has a GPS tracker inside of her, and someone is using her music and playing games in her. She was IVC and brought to [facility]  HPI:   Patient seen this morning in her room for face to face evaluation.She is pleasant and cooperative. She is able to engage in a linear conversation. She tells me she is originally from [place], [place], then moved to [place], and is now in [place]. She lives alone in an apartment. She mentions to me her kids are in [place] still, but she does not speak with them much. When I ask what brought her to the hospital she tells me in 2018 a GPS was placed inside of her that has a camera and speaker so people can speak through her and see everything she is doing. I ask if this scares her and she says no, her Doctor told her she will just poop it out. I ask her if she ever hears the GPS speak to her or any other voices and she denies this. She denies visual hallucinations. She stated she will hopefully poop it out one day so it will be gone. She tells me she has history of schizophrenia, and her grandfather had schizophrenia. She does not have any significant psychiatric hx in her chart. She tells me she has never taken any medication for schizophrenia before. When I ask her who placed the GPS in her she stated she does not know. She denies feeling like someone is trying to hurt her, she says she feels safe in the hospital. She is worried about her dog, bear, who is alone in her apartment. Pt  denies having any friends or family who could go by her place to help care for the dog.   Pt is unable to remember any of her children's phone numbers, as I was hoping to obtain collateral to see if this has been a previous delusion of the patient before since she stated it happened in 2018. Attempted to call her charts relative contact, Camillia Herter, but he stated he did not know who Brittany Shannon was and phone call was ended as no information was asked or shared. At this time we have no collateral resources. After a chart review it appears pt has not mentioned a delusion like this to caregivers before. Pt toxicology report was positive for THC, this could be substance induced. Will start zyprexa '5mg'$  po at bed time. Will recommend IP treatment at this time.   Past Psychiatric History:  - reported hx of schizophrenia, but denies any psychotropic medication trials or IP treatment stays  Risk to Self or Others: Is the patient at risk to self? Yes Has the patient been a risk to self in the past 6 months? No Has the patient been a risk to self within the distant past? No Is the patient a risk to others? No Has the patient been a risk to others in the past 6 months? No Has the patient been a risk to others within the distant past?  No  [place] Scale:  [place] [facility] from 06/20/2022 in [place] [facility] from 05/04/2022 in [place] [facility] from 11/30/2021 in [facility] No Risk No Risk No Risk         Substance Abuse:  Alcohol / Drug Use Pain Medications: See MAR Prescriptions: See MAR Over the Counter: See [facility]  Past Medical History:  Past Medical History:  Diagnosis Date   Asthma    Deafness in left ear    Homelessness    Hypertension    Sickle cell anemia (HCC)     Past Surgical History:  Procedure Laterality Date   CHOLECYSTECTOMY     EYE SURGERY      SPLENECTOMY, TOTAL     Family History:  Family History  Problem Relation Age of Onset   Sickle cell trait Other     Social History:  Social History   Substance and Sexual Activity  Alcohol Use No     Social History   Substance and Sexual Activity  Drug Use No    Social History   Socioeconomic History   Marital status: Single    Spouse name: Not on file   Number of children: Not on file   Years of education: Not on file   Highest education level: Not on file  Occupational History   Not on file  Tobacco Use   Smoking status: Former    Types: Cigarettes   Smokeless tobacco: Never  Vaping Use   Vaping Use: Never used  Substance and Sexual Activity   Alcohol use: No   Drug use: No   Sexual activity: Yes  Other Topics Concern   Not on file  Social History Narrative   Not on file   Social Determinants of Health   Financial Resource Strain: Not on file  Food Insecurity: Not on file  Transportation Needs: Not on file  Physical Activity: Not on file  Stress: Not on file  Social Connections: Not on file   Additional Social History:    Allergies:  No Known Allergies  Labs:  Results for orders placed or performed during the hospital encounter of 06/20/22 (from the past 48 hour(s))  Comprehensive metabolic panel     Status: Abnormal   Collection Time: 06/20/22  6:03 PM  Result Value Ref Range   Sodium 137 135 - 145 mmol/L   Potassium 3.6 3.5 - 5.1 mmol/L   Chloride 100 98 - 111 mmol/L   CO2 25 22 - 32 mmol/L   Glucose, Bld 103 (H) 70 - 99 mg/dL    Comment: Glucose reference range applies only to samples taken after fasting for at least 8 hours.   BUN 15 6 - 20 mg/dL   Creatinine, Ser 0.89 0.44 - 1.00 mg/dL   Calcium 10.0 8.9 - 10.3 mg/dL   Total Protein 8.3 (H) 6.5 - 8.1 g/dL   Albumin 4.5 3.5 - 5.0 g/dL   AST 40 15 - 41 U/L   ALT 25 0 - 44 U/L   Alkaline Phosphatase 59 38 - 126 U/L   Total Bilirubin 3.0 (H) 0.3 - 1.2 mg/dL   GFR, Estimated >60 >60  mL/min    Comment: (NOTE) Calculated using the CKD-EPI Creatinine Equation (2021)    Anion gap 12 5 - 15    Comment: Performed at [facility] [address], [place], [place] [address]  Ethanol  Status: None   Collection Time: 06/20/22  6:03 PM  Result Value Ref Range   Alcohol, Ethyl (B) <10 <10 mg/dL    Comment: (NOTE) Lowest detectable limit for serum alcohol is 10 mg/dL.  For medical purposes only. Performed at [facility], [place] [address], [place], [place] [address]   Urine rapid drug screen (hosp performed)     Status: Abnormal   Collection Time: 06/20/22  6:03 PM  Result Value Ref Range   Opiates NONE DETECTED NONE DETECTED   Cocaine NONE DETECTED NONE DETECTED   Benzodiazepines NONE DETECTED NONE DETECTED   Amphetamines NONE DETECTED NONE DETECTED   Tetrahydrocannabinol POSITIVE (A) NONE DETECTED   Barbiturates NONE DETECTED NONE DETECTED    Comment: (NOTE) DRUG SCREEN FOR MEDICAL PURPOSES ONLY.  IF CONFIRMATION IS NEEDED FOR ANY PURPOSE, NOTIFY LAB WITHIN 5 DAYS.  LOWEST DETECTABLE LIMITS FOR URINE DRUG SCREEN Drug Class                     Cutoff (ng/mL) Amphetamine and metabolites    1000 Barbiturate and metabolites    200 Benzodiazepine                 366 Tricyclics and metabolites     300 Opiates and metabolites        300 Cocaine and metabolites        300 THC                            50 Performed at [facility], [place] [address], [place], [place] [address]   CBC with Diff     Status: Abnormal   Collection Time: 06/20/22  6:03 PM  Result Value Ref Range   WBC 12.3 (H) 4.0 - 10.5 K/uL   RBC 3.14 (L) 3.87 - 5.11 MIL/uL   Hemoglobin 10.4 (L) 12.0 - 15.0 g/dL   HCT 28.2 (L) 36.0 - 46.0 %   MCV 89.8 80.0 - 100.0 fL   MCH 33.1 26.0 - 34.0 pg   MCHC 36.9 (H) 30.0 - 36.0 g/dL   RDW 15.8 (H) 11.5 - 15.5 %   Platelets 324 150 - 400 K/uL   nRBC 0.6 (H) 0.0 - 0.2 %   Neutrophils Relative % 67 %   Neutro Abs 8.2 (H) 1.7 - 7.7 K/uL    Lymphocytes Relative 22 %   Lymphs Abs 2.7 0.7 - 4.0 K/uL   Monocytes Relative 9 %   Monocytes Absolute 1.2 (H) 0.1 - 1.0 K/uL   Eosinophils Relative 1 %   Eosinophils Absolute 0.1 0.0 - 0.5 K/uL   Basophils Relative 1 %   Basophils Absolute 0.1 0.0 - 0.1 K/uL   Immature Granulocytes 0 %   Abs Immature Granulocytes 0.04 0.00 - 0.07 K/uL    Comment: Performed at [facility], [place] [address] [place], [place] [address]  I-Stat beta hCG blood, ED     Status: None   Collection Time: 06/20/22  6:26 PM  Result Value Ref Range   I-stat hCG, quantitative <5.0 <5 mIU/mL   Comment 3            Comment:   GEST. AGE      CONC.  (mIU/mL)   <=1 WEEK        5 - 50     2 WEEKS       50 - 500  3 WEEKS       100 - 10,000     4 WEEKS     1,000 - 30,000        FEMALE AND NON-PREGNANT FEMALE:     LESS THAN 5 mIU/mL   Resp Panel by RT-PCR (Flu A&B, Covid) Anterior Nasal Swab     Status: None   Collection Time: 06/21/22  1:05 AM   Specimen: Anterior Nasal Swab  Result Value Ref Range   SARS Coronavirus 2 by RT PCR NEGATIVE NEGATIVE    Comment: (NOTE) SARS-CoV-2 target nucleic acids are NOT DETECTED.  The SARS-CoV-2 RNA is generally detectable in upper respiratory specimens during the acute phase of infection. The lowest concentration of SARS-CoV-2 viral copies this assay can detect is 138 copies/mL. A negative result does not preclude SARS-Cov-2 infection and should not be used as the sole basis for treatment or other patient management decisions. A negative result may occur with  improper specimen collection/handling, submission of specimen other than nasopharyngeal swab, presence of viral mutation(s) within the areas targeted by this assay, and inadequate number of viral copies(<138 copies/mL). A negative result must be combined with clinical observations, patient history, and epidemiological information. The expected result is Negative.  Fact Sheet for Patients:   EntrepreneurPulse.com.au  Fact Sheet for Healthcare Providers:  IncredibleEmployment.be  This test is no t yet approved or cleared by the [place] FDA and  has been authorized for detection and/or diagnosis of SARS-CoV-2 by FDA under an Emergency Use Authorization (EUA). This EUA will remain  in effect (meaning this test can be used) for the duration of the COVID-19 declaration under Section 564(b)(1) of the Act, 21 U.S.C.section 360bbb-3(b)(1), unless the authorization is terminated  or revoked sooner.       Influenza A by PCR NEGATIVE NEGATIVE   Influenza B by PCR NEGATIVE NEGATIVE    Comment: (NOTE) The Xpert Xpress SARS-CoV-2/FLU/RSV plus assay is intended as an aid in the diagnosis of influenza from Nasopharyngeal swab specimens and should not be used as a sole basis for treatment. Nasal washings and aspirates are unacceptable for Xpert Xpress SARS-CoV-2/FLU/RSV testing.  Fact Sheet for Patients: EntrepreneurPulse.com.au  Fact Sheet for Healthcare Providers: IncredibleEmployment.be  This test is not yet approved or cleared by the [place] FDA and has been authorized for detection and/or diagnosis of SARS-CoV-2 by FDA under an Emergency Use Authorization (EUA). This EUA will remain in effect (meaning this test can be used) for the duration of the COVID-19 declaration under Section 564(b)(1) of the Act, 21 U.S.C. section 360bbb-3(b)(1), unless the authorization is terminated or revoked.  Performed at [facility], [place] [address], [place], [place] [address]     Current Facility-Administered Medications  Medication Dose Route Frequency Provider Last Rate Last Admin   albuterol (PROVENTIL) (2.5 MG/3ML) 0.083% nebulizer solution 2.5 mg  2.5 mg Nebulization Q6H PRN Tacy Learn, PA-C       bisacodyl (DULCOLAX) EC tablet 5 mg  5 mg Oral Daily PRN Suella Broad A, PA-C        gabapentin (NEURONTIN) capsule 100 mg  100 mg Oral BID Suella Broad A, PA-C   100 mg at 06/21/22 1027   lamoTRIgine (LAMICTAL) tablet 100 mg  100 mg Oral BID Suella Broad A, PA-C   100 mg at 06/21/22 1027   lisinopril (ZESTRIL) tablet 10 mg  10 mg Oral Daily Suella Broad A, PA-C   10 mg at 06/21/22 1027   OLANZapine (ZYPREXA) tablet  5 mg  5 mg Oral QHS Vesta Mixer, NP       Current Outpatient Medications  Medication Sig Dispense Refill   albuterol (VENTOLIN HFA) 108 (90 Base) MCG/ACT inhaler INHALE 2 PUFFS INTO THE LUNGS EVERY 6 HOURS AS NEEDED FOR WHEEZING (Patient taking differently: 1-2 puffs every 6 (six) hours as needed for wheezing or shortness of breath.) 8.5 g 1   bisacodyl (DULCOLAX) 5 MG EC tablet Take 1 tablet (5 mg total) by mouth daily as needed for moderate constipation. 30 tablet 0   cholecalciferol (VITAMIN D3) 25 MCG (1000 UNIT) tablet 1 tablet     diclofenac Sodium (VOLTAREN) 1 % GEL Apply 4 g topically in the morning, at noon, and at bedtime. 4 g 0   gabapentin (NEURONTIN) 100 MG capsule Take 1 capsule (100 mg total) by mouth 2 (two) times daily. 60 capsule 1   lamoTRIgine (LAMICTAL) 100 MG tablet Take 1 tablet (100 mg total) by mouth 2 (two) times daily. 60 tablet 11   lisinopril (ZESTRIL) 10 MG tablet TAKE 1 TABLET(10 MG) BY MOUTH DAILY 90 tablet 0   metoCLOPramide (REGLAN) 10 MG tablet Take 1 tablet (10 mg total) by mouth every 6 (six) hours as needed for nausea or vomiting. 15 tablet 0   oxyCODONE-acetaminophen (PERCOCET/ROXICET) 5-325 MG tablet Take 1 tablet by mouth every 6 (six) hours as needed for severe pain. 15 tablet 0     Psychiatric Specialty Exam: Presentation  General Appearance: Appropriate for Environment  Eye Contact:Good  Speech:Clear and Coherent  Speech Volume:Normal  Handedness:No data recorded  Mood and Affect  Mood:Euthymic  Affect:Congruent   Thought Process  Thought Processes:Coherent  Descriptions of  Associations:Intact  Orientation:Full (Time, Place and Person)  Thought Content:Delusions  History of Schizophrenia/Schizoaffective disorder:Yes  Duration of Psychotic Symptoms:No data recorded Hallucinations:Hallucinations: None  Ideas of Reference:Delusions  Suicidal Thoughts:Suicidal Thoughts: No  Homicidal Thoughts:Homicidal Thoughts: No   Sensorium  Memory:Immediate Fair  Judgment:Impaired  Insight:Poor   Executive Functions  Concentration:Fair  Attention Span:Fair  [place]   Psychomotor Activity  Psychomotor Activity:Psychomotor Activity: Normal   Assets  Assets:Communication Skills; Housing; Physical Health; Resilience    Sleep  Sleep:Sleep: Fair   Physical Exam: Physical Exam Neurological:     Mental Status: She is alert and oriented to person, place, and time.    Review of Systems  Psychiatric/Behavioral:         Delusions a GPS tracker with camera is inside her  All other systems reviewed and are negative.  Blood pressure (!) 145/96, pulse 88, temperature 98.9 F (37.2 C), temperature source Oral, resp. rate 15, last menstrual period 04/07/2013, SpO2 97 %. There is no height or weight on file to calculate BMI.  Medical Decision Making: Case reviewed and discussed with Dr. Dwyane Dee. Will recommend psychiatric inpatient treatment. No current available beds at [facility]. Psychiatry CSW notified to seek placement. LCSW, EDP, and RN notified of disposition.    Problem 1: psychosis - zyprexa '5mg'$  po at bed time initiated   Disposition: Recommend psychiatric Inpatient admission when medically cleared.  Vesta Mixer, NP 06/21/2022 12:05 PM

## 2022-06-22 DIAGNOSIS — F22 Delusional disorders: Secondary | ICD-10-CM | POA: Diagnosis not present

## 2022-06-22 MED ORDER — LORAZEPAM 1 MG PO TABS
1.0000 mg | ORAL_TABLET | Freq: Once | ORAL | Status: AC | PRN
  Administered 2022-06-22: 1 mg via ORAL
  Filled 2022-06-22: qty 1

## 2022-06-22 MED ORDER — ZIPRASIDONE MESYLATE 20 MG IM SOLR: 15.0000 mg | Freq: Once | INTRAMUSCULAR | Status: DC

## 2022-06-22 NOTE — ED Notes (Addendum)
Pt report given to [facility] @ [phone] @  Brittany Shannon

## 2022-06-22 NOTE — ED Provider Notes (Signed)
Patient became increasingly agitated, with pressured speech, not understanding why she is being kept in the emergency department, though this was explained repeatedly to her.  She is difficult to redirect at this time. If she continues to escalate with agitation, intramuscular Geodon ordered, 15 mg per her smaller body size.   Wyvonnia Dusky, MD 06/22/22 1148

## 2022-06-22 NOTE — ED Notes (Signed)
Called sheriff's office for out of county transport, left a voicemail and callback number. Handed all paperwork with attending nurse.

## 2022-06-22 NOTE — ED Notes (Signed)
Pt ambulated to restroom with cane and no assistance

## 2022-06-22 NOTE — ED Provider Notes (Signed)
Emergency Medicine Observation Re-evaluation Note  Brittany Shannon is a 51 y.o. female, seen on rounds today.  Pt initially presented to the ED for complaints of hallucinations. Currently, the patient is resting.  Physical Exam  BP (!) 142/94 (BP Location: Left Arm)   Pulse 87   Temp 98.3 F (36.8 C) (Oral)   Resp 18   LMP 04/07/2013 Comment: neg preg 11/03/16  SpO2 94%  Physical Exam General: NAD Cardiac: Regular HR Lungs: No respiratory distress Psych: Stable  ED Course / MDM  EKG:   I have reviewed the labs performed to date as well as medications administered while in observation.    Plan  Current plan is for seeking inpatient behavioral health placement.  Brittany Shannon is under involuntary commitment.      Brittany Dusky, MD 06/22/22 [phone]

## 2022-06-22 NOTE — ED Notes (Signed)
Currently eating her lunch, but she started to crying inconsolable.

## 2022-06-22 NOTE — ED Notes (Signed)
Boisterous after being informed repeatedly that she will be going to another facility an attempt to be argumentative until this nurse walked out an ended this conversation.

## 2022-06-22 NOTE — ED Notes (Signed)
Pt crying states she doesn't know why she is here. And she can not hear.  Ms. Clenney hearing aid isn't charged and she isn't able to get in contact with a family member to bring a Games developer. Multiple attempts to explain verbal and written about IVC with Ms. Younis becoming increasingly upset.

## 2022-06-22 NOTE — ED Provider Notes (Signed)
Patient accepted to [facility] this evening after 8 pm, accepting physician Dr. Genia Del.   Wyvonnia Dusky, MD 06/22/22 1032

## 2022-06-22 NOTE — ED Notes (Signed)
Received verbal report from [facility] at this time

## 2022-06-22 NOTE — Progress Notes (Signed)
Pt was accepted to [facility] 06/22/22  Pt meets inpatient criteria per Vesta Mixer, NP  Attending Physician will be Dr. Genia Del  Report can be called [phone]  Pt can arrive after 8:00pm  Nursing notified:Katrina Jimmye [place], [place], [place] 06/22/2022 @ 10:23 AM

## 2022-06-22 NOTE — ED Notes (Signed)
Brittany Shannon made aware pt wouldn't be transported till in the morning

## 2022-06-22 NOTE — ED Notes (Signed)
Ms. Brittany Shannon sitting in a chair, in the corner and not speaking to anyone.  Appears calm.

## 2022-06-23 NOTE — ED Notes (Signed)
Pt ambulating to restroom without assistance.

## 2022-06-23 NOTE — ED Notes (Signed)
This RN spoke with [facility] to get receiving MD name and room number. Update given to MP on status of transport. This RN will call back when patient leaves to let them know she is on the way to their facility.

## 2022-06-23 NOTE — ED Notes (Signed)
Verbal report given to April O RN at this time

## 2022-06-23 NOTE — ED Provider Notes (Signed)
Emergency Medicine Observation Re-evaluation Note  Brittany Shannon is a 51 y.o. female, seen on rounds today.  Pt initially presented to the ED for complaints of IVC Currently, the patient is resting quietly.  Physical Exam  BP 126/86 (BP Location: Right Arm)   Pulse 100   Temp 98.6 F (37 C) (Oral)   Resp 18   LMP 04/07/2013 Comment: neg preg 11/03/16  SpO2 98%  Physical Exam General: No acute distress Cardiac: Well-perfused Lungs: Nonlabored Psych: Deferred  ED Course / MDM  EKG:   I have reviewed the labs performed to date as well as medications administered while in observation.  Recent changes in the last 24 hours include patient has been accepted to [facility].  Plan  Current plan is for transfer. Brittany Shannon is under involuntary commitment.      Hayden Rasmussen, MD 06/23/22 [phone]

## 2022-06-23 NOTE — ED Notes (Signed)
MP contacted to let them know that patient is on the way

## 2022-06-23 NOTE — ED Notes (Signed)
Voicemail left with [facility] to arrange transport for patient to [facility]

## 2022-07-26 ENCOUNTER — Encounter: Payer: Self-pay | Admitting: Adult Health

## 2022-07-26 ENCOUNTER — Ambulatory Visit (INDEPENDENT_AMBULATORY_CARE_PROVIDER_SITE_OTHER): Payer: Medicare Other | Admitting: Adult Health

## 2022-07-26 VITALS — BP 113/78 | HR 77 | Ht 69.0 in | Wt 119.0 lb

## 2022-07-26 DIAGNOSIS — M62838 Other muscle spasm: Secondary | ICD-10-CM | POA: Diagnosis not present

## 2022-07-26 DIAGNOSIS — R569 Unspecified convulsions: Secondary | ICD-10-CM

## 2022-07-26 NOTE — Progress Notes (Unsigned)
PATIENT: Brittany Shannon DOB: 1971/09/12  REASON FOR VISIT: follow up HISTORY FROM: patient PRIMARY NEUROLOGIST: Dr. Krista Blue   Chief Complaint  Patient presents with   Follow-up    Pt in 5  pt is here for involuntary movement in legs and feet Pt has to stop and wait until movement stops to continue to walk      HISTORY OF PRESENT ILLNESS: Today 07/26/22:  Ms. Brittany Shannon is a 51 year old female with a history of Myclonic jerking. EEG was been unremarkable. Reports that Dr. Rolena Infante took her off Lamictal and started gabapentin. Reports that she still has jerking but doesn't have any more discomfort since she started gabapentin. Reports that she was on baclofen does not feel that it helped with jerking.  She states that when she is walking she has to make frequent stops because of the jerking in the right leg.  She returns today for an evaluation.  HISTORY .(Copied from Dr. Rhea Belton note):Brittany Shannon, is a 51 year old female, seen in request by her primary care nurse practitioner Vevelyn Francois, for evaluation of right lower extremity spasm, initial evaluation was on December 20, 2021   I reviewed and summarized the referring note. PMHx Sickle cell anemia Splenectomy   She had a history of sickle cell anemia, had splenectomy, which has helped her sickle cell crisis  She had right knee injury, she fell on Apr 21, 2019 when her right foot was caught in a cable cord, she felt on her right knee, before that she reported walking without difficulty, work as a Glass blower/designer  Right knee pain has some improvement, but still reported moderate pain, since then she had frequent uncontrollable right lower extremity muscle jerking, I was able to witness few episode, mimic right lower extremity myoclonic activity, she has mild right lower extremity weakness, reported mainly due to her right knee pain,  She has no control of her right lower extremity muscles spasm, jerking movement, is usually triggered by  right knee extension, it can last for few minutes or even longer, make her very tired, she does has asymmetric lower extremity deep tendon reflex, right lower extremity was hyperreflexia  She denies left lower extremity involvement, denies spreading to right upper extremity, she also complains of right hip pain   She has sensorineural loss hearing loss, status post right cochlear implant, not MRIs candidate   UPDATE Jan 24 2022: I personally reviewed CT cervical spine, mild degenerative changes, no significant canal or foraminal narrowing  CT thoracic spine showed no significant pathology  CT head without contrast, small left frontal encephalomalacia, no acute abnormality  Laboratory evaluations in January 2023, CBC showed hemoglobin of 11.2, CMP showed potassium of 3.3, total protein of 8.2, bilirubin of 2.4, lactate dehydrogenase elevated 253, elevated immature reticular cell fraction, normal ESR, ferritin level was elevated 643, normal copper, B12, CPK, TSH, negative RPR, HIV,   Extensive evaluation failed to demonstrate significant abnormality to explain her new onset right leg jerking movement, I was able to observing that she reported that she has worsening right leg jerking movement with right leg extension, but multiple times she purposely extend her right leg followed by increase to right leg tremor, if I help her to flex her right knee, she will continue her left jerking movement, but with significant change of her neck abnormal movement pattern, then she will develop bilateral lower extremity jerking movement, with pelvic thrusting as well, no loss of consciousness, no upper extremity involvement, she denies  significant pain   She reported being compliant with lamotrigine 100 twice a day, no improvement,  REVIEW OF SYSTEMS: Out of a complete 14 system review of symptoms, the patient complains only of the following symptoms, and all other reviewed systems are negative.  ALLERGIES: No  Known Allergies  HOME MEDICATIONS: Outpatient Medications Prior to Visit  Medication Sig Dispense Refill   albuterol (VENTOLIN HFA) 108 (90 Base) MCG/ACT inhaler INHALE 2 PUFFS INTO THE LUNGS EVERY 6 HOURS AS NEEDED FOR WHEEZING (Patient taking differently: 1-2 puffs every 6 (six) hours as needed for wheezing or shortness of breath.) 8.5 g 1   bisacodyl (DULCOLAX) 5 MG EC tablet Take 1 tablet (5 mg total) by mouth daily as needed for moderate constipation. 30 tablet 0   cholecalciferol (VITAMIN D3) 25 MCG (1000 UNIT) tablet 1 tablet     diclofenac Sodium (VOLTAREN) 1 % GEL Apply 4 g topically in the morning, at noon, and at bedtime. 4 g 0   gabapentin (NEURONTIN) 100 MG capsule Take 1 capsule (100 mg total) by mouth 2 (two) times daily. 60 capsule 1   lamoTRIgine (LAMICTAL) 100 MG tablet Take 1 tablet (100 mg total) by mouth 2 (two) times daily. 60 tablet 11   lisinopril (ZESTRIL) 10 MG tablet TAKE 1 TABLET(10 MG) BY MOUTH DAILY 90 tablet 0   metoCLOPramide (REGLAN) 10 MG tablet Take 1 tablet (10 mg total) by mouth every 6 (six) hours as needed for nausea or vomiting. 15 tablet 0   oxyCODONE-acetaminophen (PERCOCET/ROXICET) 5-325 MG tablet Take 1 tablet by mouth every 6 (six) hours as needed for severe pain. 15 tablet 0   No facility-administered medications prior to visit.    PAST MEDICAL HISTORY: Past Medical History:  Diagnosis Date   Asthma    Deafness in left ear    Homelessness    Hypertension    Sickle cell anemia ([place])     PAST SURGICAL HISTORY: Past Surgical History:  Procedure Laterality Date   CHOLECYSTECTOMY     EYE SURGERY     SPLENECTOMY, TOTAL      FAMILY HISTORY: Family History  Problem Relation Age of Onset   Sickle cell trait Other     SOCIAL HISTORY: Social History   Socioeconomic History   Marital status: Single    Spouse name: Not on file   Number of children: Not on file   Years of education: Not on file   Highest education level: Not on file   Occupational History   Not on file  Tobacco Use   Smoking status: Former    Types: Cigarettes   Smokeless tobacco: Never  Vaping Use   Vaping Use: Never used  Substance and Sexual Activity   Alcohol use: No   Drug use: No   Sexual activity: Yes  Other Topics Concern   Not on file  Social History Narrative   Not on file   Social Determinants of Health   Financial Resource Strain: Not on file  Food Insecurity: Not on file  Transportation Needs: Not on file  Physical Activity: Not on file  Stress: Not on file  Social Connections: Not on file  Intimate Partner Violence: Not on file      PHYSICAL EXAM  Vitals:   07/26/22 1041  BP: 113/78  Pulse: 77  Weight: 119 lb (54 kg)  Height: 5' 9"  (1.753 m)   Body mass index is 17.57 kg/m.  Generalized: Well developed, in no acute distress   Neurological examination  Mentation: Alert oriented to time, place, history taking. Follows all commands speech and language fluent Cranial nerve II-XII: Pupils were equal round reactive to light. Extraocular movements were full, visual field were full on confrontational test. Facial sensation and strength were normal.  Head turning and shoulder shrug  were normal and symmetric. Motor: The motor testing reveals 5 over 5 strength of all extremities with the exception of the right leg.  She reports that she is unable to lift it.  However she was able to bend the leg to push off the floor and stand up. good symmetric motor tone is noted throughout.  Sensory: Sensory testing is intact to soft touch on all 4 extremities. No evidence of extinction is noted.  Coordination: Cerebellar testing reveals good finger-nose-finger.  Unable to complete heel-to-shin with the right leg due to jerking Gait and station: Uses a cane when ambulating.  Has to stop frequently due to jerking in the right leg   DIAGNOSTIC DATA (LABS, IMAGING, TESTING) - I reviewed patient records, labs, notes, testing and imaging  myself where available.  Lab Results  Component Value Date   WBC 12.3 (H) 06/20/2022   HGB 10.4 (L) 06/20/2022   HCT 28.2 (L) 06/20/2022   MCV 89.8 06/20/2022   PLT 324 06/20/2022      Component Value Date/Time   NA 137 06/20/2022 1803   NA 137 11/30/2021 1153   NA 138 02/13/2012 2057   K 3.6 06/20/2022 1803   K 4.3 02/13/2012 2057   CL 100 06/20/2022 1803   CL 104 02/13/2012 2057   CO2 25 06/20/2022 1803   CO2 27 02/13/2012 2057   GLUCOSE 103 (H) 06/20/2022 1803   GLUCOSE 116 (H) 02/13/2012 2057   BUN 15 06/20/2022 1803   BUN 17 11/30/2021 1153   BUN 8 02/13/2012 2057   CREATININE 0.89 06/20/2022 1803   CREATININE 0.86 01/03/2022 1357   CREATININE 0.59 (L) 02/13/2012 2057   CALCIUM 10.0 06/20/2022 1803   CALCIUM 9.4 02/13/2012 2057   PROT 8.3 (H) 06/20/2022 1803   PROT 7.5 11/30/2021 1153   PROT 8.8 (H) 02/13/2012 2057   ALBUMIN 4.5 06/20/2022 1803   ALBUMIN 4.0 11/30/2021 1153   ALBUMIN 4.7 02/13/2012 2057   AST 40 06/20/2022 1803   AST 28 01/03/2022 1357   ALT 25 06/20/2022 1803   ALT 18 01/03/2022 1357   ALT 20 02/13/2012 2057   ALKPHOS 59 06/20/2022 1803   ALKPHOS 56 02/13/2012 2057   BILITOT 3.0 (H) 06/20/2022 1803   BILITOT 2.4 (H) 01/03/2022 1357   GFRNONAA >60 06/20/2022 1803   GFRNONAA >60 01/03/2022 1357   GFRNONAA >60 02/13/2012 2057   GFRAA >60 07/03/2020 1311   GFRAA >60 02/13/2012 2057   Lab Results  Component Value Date   CHOL 119 04/29/2019   HDL 37 (L) 04/29/2019   LDLCALC 70 04/29/2019   TRIG 62 04/29/2019   CHOLHDL 3.2 04/29/2019   Lab Results  Component Value Date   HGBA1C 4.4 07/23/2019   Lab Results  Component Value Date   VITAMINB12 1,108 12/20/2021   Lab Results  Component Value Date   TSH 1.230 12/20/2021      ASSESSMENT AND PLAN 51 y.o. year old female  has a past medical history of Asthma, Deafness in left ear, Homelessness, Hypertension, and Sickle cell anemia ([place]). here with:  1.  Myoclonic  jerking  -Discussed with Dr. Juliet Rude can try Depakote ER 1000 mg at bedtime to see if this offers  any benefit.  -She will follow-up in 3 months or sooner with Dr. Krista Blue.     Ward Givens, MSN, NP-C 07/26/2022, 10:00 AM [facility] [address] [place], [place] [address] [phone]

## 2022-07-27 MED ORDER — DIVALPROEX SODIUM ER 500 MG PO TB24: 1000.0000 mg | ORAL_TABLET | Freq: Every day | ORAL | 5 refills | Status: AC

## 2022-07-27 NOTE — Patient Instructions (Signed)
Divalproex Sodium Delayed- or Extended-Release Tablets What is this medication? DIVALPROEX SODIUM (dye VAL pro ex SO dee um) prevents and controls seizures in people with epilepsy. It may also be used to prevent migraine headaches. It can also be used to treat bipolar disorder. It works by calming overactive nerves in your body. This medicine may be used for other purposes; ask your health care provider or pharmacist if you have questions. COMMON BRAND NAME(S): Depakote, Depakote ER What should I tell my care team before I take this medication? They need to know if you have any of these conditions: Frequently drink alcohol Kidney disease Liver disease Low platelet counts Mitochondrial disease Suicidal thoughts, plans, or attempt by you or a family member Urea cycle disorder (UCD) An unusual or allergic reaction to divalproex sodium, sodium valproate, valproic acid, other medications, foods, dyes, or preservatives Pregnant or trying to get pregnant Breast-feeding How should I use this medication? Take this medication by mouth with a drink of water. Follow the directions on the prescription label. Do not cut, crush or chew this medication. You can take it with or without food. If it upsets your stomach, take it with food. Take your medication at regular intervals. Do not take it more often than directed. Do not stop taking except on your care team's advice. A special MedGuide will be given to you by the pharmacist with each prescription and refill. Be sure to read this information carefully each time. Talk to your care team about the use of this medication in children. While this medication may be prescribed for children as young as 10 years for selected conditions, precautions do apply. Overdosage: If you think you have taken too much of this medicine contact a poison control center or emergency room at once. NOTE: This medicine is only for you. Do not share this medicine with others. What if I  miss a dose? If you miss a dose, take it as soon as you can. If it is almost time for your next dose, take only that dose. Do not take double or extra doses. What may interact with this medication? Do not take this medication with any of the following: Sodium phenylbutyrate This medication may also interact with the following: Aspirin Certain antibiotics, such as ertapenem, imipenem, meropenem Certain medications for depression, anxiety, or other mental health conditions Certain medications for seizures, such as cannabidiol, carbamazepine, clonazepam, diazepam, ethosuximide, felbamate, lamotrigine, phenobarbital, phenytoin, primidone, rufinamide, topiramate Certain medications that treat or prevent blood clots, such as warfarin Cholestyramine Estrogen and progestin hormones Methotrexate Propofol Rifampin Ritonavir Tolbutamide Zidovudine This list may not describe all possible interactions. Give your health care provider a list of all the medicines, herbs, non-prescription drugs, or dietary supplements you use. Also tell them if you smoke, drink alcohol, or use illegal drugs. Some items may interact with your medicine. What should I watch for while using this medication? Tell your care team if your symptoms do not get better or they start to get worse. This medication may cause serious skin reactions. They can happen weeks to months after starting the medication. Contact your care team right away if you notice fevers or flu-like symptoms with a rash. The rash may be red or purple and then turn into blisters or peeling of the skin. Or, you might notice a red rash with swelling of the face, lips or lymph nodes in your neck or under your arms. Wear a medical ID bracelet or chain, and carry a card that describes   your disease and details of your medication and dosage times. You may get drowsy, dizzy, or have blurred vision. Do not drive, use machinery, or do anything that needs mental alertness  until you know how this medication affects you. To reduce dizzy or fainting spells, do not sit or stand up quickly, especially if you are an older patient. Alcohol can increase drowsiness and dizziness. Avoid alcoholic drinks. This medication can make you more sensitive to the sun. Keep out of the sun. If you cannot avoid being in the sun, wear protective clothing and use sunscreen. Do not use sun lamps or tanning beds/booths. Patients and their families should watch out for new or worsening depression or thoughts of suicide. Also watch out for sudden changes in feelings such as feeling anxious, agitated, panicky, irritable, hostile, aggressive, impulsive, severely restless, overly excited and hyperactive, or not being able to sleep. If this happens, especially at the beginning of treatment or after a change in dose, call your care team. Women should inform their care team if they wish to become pregnant or think they might be pregnant. There is a potential for serious side effects to an unborn child. Talk to your care team or pharmacist for more information. Women who become pregnant while using this medication may enroll in the [place] Antiepileptic Drug Pregnancy Registry by calling 1-888-233-2334. This registry collects information about the safety of antiepileptic medication use during pregnancy. This medication may cause a decrease in folic acid and vitamin D. You should make sure that you get enough vitamins while you are taking this medication. Discuss the foods you eat and the vitamins you take with your care team. What side effects may I notice from receiving this medication? Side effects that you should report to your care team as soon as possible: Allergic reactions--skin rash, itching, hives, swelling of the face, lips, tongue, or throat High ammonia level--unusual weakness or fatigue, confusion, loss of appetite, nausea, vomiting, seizures Liver injury--right upper belly pain, loss of  appetite, nausea, light-colored stool, dark yellow or brown urine, yellowing skin or eyes, unusual weakness or fatigue Low body temperature, drowsiness, confusion Pancreatitis--severe stomach pain that spreads to your back or gets worse after eating or when touched, fever, nausea, vomiting Rash, fever, and swollen lymph nodes Thoughts of suicide or self-harm, worsening mood, feelings of depression Unusual bruising or bleeding Side effects that usually do not require medical attention (report to your care team if they continue or are bothersome): Change in vision Dizziness Drowsiness Hair loss Headache Nausea Tremors or shaking Weight gain This list may not describe all possible side effects. Call your doctor for medical advice about side effects. You may report side effects to FDA at 1-800-FDA-1088. Where should I keep my medication? Keep out of reach of children and pets. Store at room temperature between 15 and 30 degrees C (59 and 86 degrees F). Keep container tightly closed. Throw away any unused medication after the expiration date. NOTE: This sheet is a summary. It may not cover all possible information. If you have questions about this medicine, talk to your doctor, pharmacist, or health care provider.  2023 Elsevier/Gold Standard (2021-07-22 00:00:00)  

## 2022-08-09 ENCOUNTER — Ambulatory Visit (INDEPENDENT_AMBULATORY_CARE_PROVIDER_SITE_OTHER): Payer: Medicare Other | Admitting: Family Medicine

## 2022-08-09 VITALS — BP 140/94 | Ht 69.0 in

## 2022-08-09 DIAGNOSIS — R269 Unspecified abnormalities of gait and mobility: Secondary | ICD-10-CM

## 2022-08-09 DIAGNOSIS — M25561 Pain in right knee: Secondary | ICD-10-CM | POA: Diagnosis not present

## 2022-08-09 DIAGNOSIS — M62838 Other muscle spasm: Secondary | ICD-10-CM | POA: Diagnosis not present

## 2022-08-09 NOTE — Progress Notes (Unsigned)
PCP: Vevelyn Francois, NP  Subjective:   HPI: Patient is a 51 y.o. female here for follow-up of right knee pain/ right lower extremity jerking.  She is still having continued right knee pain and difficulty with ambulation. She has taken a break from PT since the therapist wanted her to touch base with neurology first. She feels weak with flexion of the knee and has issues lifting her foot off the ground. Still taking Advil as needed for pain. Has not been icing her knee.  Started taking Gabapentin 100 mg BID in 05/2022 which she feels is helpful for her pain that she feels is mostly nerve pain. No swelling of the knee. Ambulates with a cane. She feels she is ready to go back to PT.   Past Medical History:  Diagnosis Date   Asthma    Deafness in left ear    Homelessness    Hypertension    Sickle cell anemia ([facility])     Current Outpatient Medications on File Prior to Visit  Medication Sig Dispense Refill   albuterol (VENTOLIN HFA) 108 (90 Base) MCG/ACT inhaler INHALE 2 PUFFS INTO THE LUNGS EVERY 6 HOURS AS NEEDED FOR WHEEZING (Patient taking differently: 1-2 puffs every 6 (six) hours as needed for wheezing or shortness of breath.) 8.5 g 1   bisacodyl (DULCOLAX) 5 MG EC tablet Take 1 tablet (5 mg total) by mouth daily as needed for moderate constipation. 30 tablet 0   cholecalciferol (VITAMIN D3) 25 MCG (1000 UNIT) tablet 1 tablet     diclofenac Sodium (VOLTAREN) 1 % GEL Apply 4 g topically in the morning, at noon, and at bedtime. 4 g 0   divalproex (DEPAKOTE ER) 500 MG 24 hr tablet Take 2 tablets (1,000 mg total) by mouth daily. 60 tablet 5   gabapentin (NEURONTIN) 100 MG capsule Take 1 capsule (100 mg total) by mouth 2 (two) times daily. 60 capsule 1   lisinopril (ZESTRIL) 10 MG tablet TAKE 1 TABLET(10 MG) BY MOUTH DAILY 90 tablet 0   metoCLOPramide (REGLAN) 10 MG tablet Take 1 tablet (10 mg total) by mouth every 6 (six) hours as needed for nausea or vomiting. 15 tablet 0    oxyCODONE-acetaminophen (PERCOCET/ROXICET) 5-325 MG tablet Take 1 tablet by mouth every 6 (six) hours as needed for severe pain. 15 tablet 0   No current facility-administered medications on file prior to visit.    Past Surgical History:  Procedure Laterality Date   CHOLECYSTECTOMY     EYE SURGERY     SPLENECTOMY, TOTAL      No Known Allergies  BP (!) 140/94   Ht '5\' 9"'$  (1.753 m)   LMP 04/07/2013 Comment: neg preg 11/03/16  BMI 17.57 kg/m       No data to display              No data to display              Objective:  Physical Exam:  Gen: NAD, comfortable in exam room Notable diffuse atrophy of bilateral lower extremities  Right knee: TTP lateral to patellar tendon. Otherwise, no joint line tenderness. No swelling or bruising on inspection. Able to fully extend knee slowly. No pain with IR/ER. Has intermittent tremor/jerking motion of right leg during exam. Notable decreased muscle tone especially in quadriceps muscles.  Negative valgus/varus.  Negative anterior/posterior drawers.  Negative lachman.  Negative mcmurray and apley.  Assessment & Plan:  1. 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 If no improvement after more PT, can consider MRI scan although this would be challenging as she might not be able to remain still for the length of the study. Continue to follow w/ neurology for tremors/jerking. Continue gabapentin at current dosage  F/u in 6 weeks

## 2022-08-10 ENCOUNTER — Encounter: Payer: Self-pay | Admitting: Family Medicine

## 2022-08-21 ENCOUNTER — Encounter: Payer: Self-pay | Admitting: Physical Therapy

## 2022-08-21 ENCOUNTER — Other Ambulatory Visit: Payer: Self-pay

## 2022-08-21 ENCOUNTER — Ambulatory Visit: Payer: Medicare Other | Attending: Family Medicine | Admitting: Physical Therapy

## 2022-08-21 DIAGNOSIS — R269 Unspecified abnormalities of gait and mobility: Secondary | ICD-10-CM | POA: Diagnosis not present

## 2022-08-21 DIAGNOSIS — R2689 Other abnormalities of gait and mobility: Secondary | ICD-10-CM | POA: Diagnosis present

## 2022-08-21 DIAGNOSIS — M6281 Muscle weakness (generalized): Secondary | ICD-10-CM | POA: Diagnosis present

## 2022-08-21 DIAGNOSIS — M25561 Pain in right knee: Secondary | ICD-10-CM | POA: Insufficient documentation

## 2022-08-21 DIAGNOSIS — R29818 Other symptoms and signs involving the nervous system: Secondary | ICD-10-CM | POA: Diagnosis present

## 2022-08-21 DIAGNOSIS — M62838 Other muscle spasm: Secondary | ICD-10-CM | POA: Diagnosis not present

## 2022-08-21 NOTE — Therapy (Signed)
OUTPATIENT PHYSICAL THERAPY NEURO EVALUATION   Patient Name: Brittany Shannon MRN: [id number] DOB:1971-07-12, 51 y.o., female Today's Date: 08/22/2022   PCP: Vevelyn Francois, NP REFERRING PROVIDER: Dene Gentry, MD   PT End of Session - 08/21/22 1540     Visit Number 1    Number of Visits 13    Date for PT Re-Evaluation 10/06/22    Authorization Type [employer] Medicare/Medicaid    PT Start Time 4854    PT Stop Time 6270    PT Time Calculation (min) 42 min    Activity Tolerance Patient tolerated treatment well    Behavior During Therapy WFL for tasks assessed/performed             Past Medical History:  Diagnosis Date   Asthma    Deafness in left ear    Homelessness    Hypertension    Sickle cell anemia ([place])    Past Surgical History:  Procedure Laterality Date   CHOLECYSTECTOMY     EYE SURGERY     SPLENECTOMY, TOTAL     Patient Active Problem List   Diagnosis Date Noted   Psychosis ([place]) 06/21/2022   Cannabis abuse 06/21/2022   Convulsions ([place]) 04/04/2022   Abnormal EEG 02/15/2022   Cerebrovascular accident (CVA) ([place]) 01/24/2022   Muscle spasm of right leg 12/20/2021   Gait abnormality 12/20/2021   HL (hearing loss) 11/30/2021   Hypertension 11/30/2021   Vitamin D deficiency 07/05/2020   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   Generalized abdominal pain 07/02/2020   Hypokalemia 04/28/2019   Intractable nausea and vomiting 04/28/2019   Sickle cell anemia with crisis ([place]) 04/28/2019   QT prolongation 04/28/2019   Sickle cell pain crisis ([place]) 04/28/2019   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   History of detached retina repair 02/20/2017   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   Delusional disorder ([place]) 10/24/2016   Chest pain 07/14/2016   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   Asthma 07/14/2016   HTN (hypertension) 07/14/2016   SNHL (sensorineural hearing loss) 03/11/2015   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  Have been seeing PT earlier this year and took a break to follow-up with neurologist.  Added gabapentin and feel that spasms/tremors have slowed down.  Everything is taking me longer-dressing, moving, walking Pt accompanied by: self  PERTINENT HISTORY: See above-myoclonic jerking, RLE spasms noted in neurologist chart  PAIN:  Are you having pain? Yes: NPRS scale: 4-5/10 Pain location: R knee, R hip, lateral thigh Pain description: achiness Aggravating factors: touching right leg, bumping it Relieving factors: Advil  PRECAUTIONS: Fall  FALLS: Has patient fallen in last 6 months? No  LIVING ENVIRONMENT: Lives with: lives alone Lives in: Other duplex Stairs: Yes: External: 3 steps; none Has following equipment at home: Single point cane  PLOF: Independent  PATIENT GOALS Being able to pick up foot and put socks and shoes on and to walk better.  OBJECTIVE:   DIAGNOSTIC FINDINGS: No acute findings per hip x-ray 06/09/2022; CT scan cervical spine and EEG unremarkable  COGNITION: Overall cognitive status: Within functional limits for tasks assessed   SENSATION: Light touch: intact to light touch grossly, but with  light touch to RLE, increased myoclonic jerking RLE  COORDINATION: Decreased RLE coordination with decreased ability for active ROM  POSTURE:  Sits with weightshifted to LLE; keeps RLE internally rotated, knee extended, ankle plantarflexed in sitting  LOWER EXTREMITY ROM:    Holds R knee in extension, R ankle in plantarflexion (30degrees).  With attempts at RLE knee active flexion, pt has increased jerking flexion/extension motions at knee.    Passive seated knee flexion:  55 degrees 1st trial, 72 degrees second trial, unable to eccentrically control to extend knee and RLE begins jerking again Active  Right Eval Left Eval  Hip flexion    Hip extension    Hip abduction    Hip adduction    Hip internal rotation    Hip external rotation    Knee flexion    Knee extension    Ankle dorsiflexion    Ankle plantarflexion    Ankle inversion    Ankle eversion     (Blank rows = not tested)  LOWER EXTREMITY MMT:  LLE 5/5 grossly tested; unable to fully assess RLE due to jerking motion at attempts at active or passive motion  MMT Right Eval Left Eval  Hip flexion    Hip extension    Hip abduction 2/5 (supine)   Hip adduction    Hip internal rotation    Hip external rotation    Knee flexion    Knee extension    Ankle dorsiflexion    Ankle plantarflexion    Ankle inversion    Ankle eversion    (Blank rows = not tested)  TRANSFERS: Assistive device utilized:  none   Sit to stand: Modified independence and extra time and keeps R knee extended  Stand to sit: Modified independence and extra time, keeps R knee extended  GAIT: Gait pattern:  RLE spasms and stops gait approximately every 10-20 ft, step to pattern, step through pattern, decreased step length- Right, decreased stance time- Right, decreased hip/knee flexion- Right, decreased ankle dorsiflexion- Right, Right foot flat, wide BOS, and abducted- Right Distance walked: 60 Assistive device utilized: Single point cane and wears  R knee brace Level of assistance: Modified independence Comments: Gait velocity:  15.82 sec in 26 ft (1.64 ft/sec)  PATIENT EDUCATION: Education details: PT eval and POC Person  educated: Patient Education method: Explanation Education comprehension: verbalized understanding   HOME EXERCISE PROGRAM: Not yet initiated     GOALS: Goals reviewed with patient? Yes  SHORT TERM GOALS: Target date: 09/22/2022  Pt will be independent with HEP for improved flexibility, strength, transfers, gait. Baseline: Goal status: INITIAL  2.  Pt will improve A/ROM R knee flexion to at least 90 degrees for improved transfers and gait Baseline: A/AROM 72 degrees at best Goal status: INITIAL  3.  Pt will improve R ankle dorsiflexion to -10 degrees for improved gait pattern and leg poistioning for transfers. Baseline: -30 degrees Goal status: INITIAL  LONG TERM GOALS: Target date: 10/06/2022 Pt will be independent with HEP for improved strength, flexibility, transfers, gait. Baseline:  Goal status: INITIAL  2.  Pt will improve gait velocity to at least 2 ft/sec for improved gait efficiency and safety.  Baseline: 1.64 ft/sec Goal status: INITIAL  3.  Pt will be able to ambulate at least 40 ft prior to onset of myoclonic jerking with gait.  Baseline: Has to stop approx 10-15 ft with gait Goal status: INITIAL  4.  Pt will verbalize ongoing community fitness plans to maintain gains made in PT. Baseline:  Goal status: INITIAL  ASSESSMENT:  CLINICAL IMPRESSION: Patient is a 51 y.o. female who was seen today for physical therapy evaluation and treatment for gait abnormality, RLE spasms. She has had spasms/myoclonic jerking of R knee  for at least on year, being seen by neurology and sports medicine.  Pt demonstrates decreased flexibility, decreased strength, abnormal muscle tone and coordination, decreased efficiency with transfers and gait, abnormal posture, RLE pain.  She has recently started  on new nerve-related medication (Gabapentin), and MD note states decreased pain.  She would benefit from skilled PT to address the above stated deficits to decrease fall risk and improve overall functional mobility and independence.  OBJECTIVE IMPAIRMENTS Abnormal gait, decreased balance, decreased mobility, difficulty walking, decreased ROM, decreased strength, increased muscle spasms, impaired flexibility, postural dysfunction, and pain.   ACTIVITY LIMITATIONS sitting, standing, transfers, and locomotion level  PARTICIPATION LIMITATIONS: shopping and community activity  PERSONAL FACTORS 3+ comorbidities: see above  are also affecting patient's functional outcome.   REHAB POTENTIAL: Good  CLINICAL DECISION MAKING: Evolving/moderate complexity  EVALUATION COMPLEXITY: Moderate  PLAN: PT FREQUENCY: 2x/week  PT DURATION: 6 weeks plus eval visit  PLANNED INTERVENTIONS: Therapeutic exercises, Therapeutic activity, Neuromuscular re-education, Balance training, Gait training, Patient/Family education, Self Care, Joint mobilization, DME instructions, Aquatic Therapy, Cryotherapy, Moist heat, and Manual therapy  PLAN FOR NEXT SESSION: Try to initiate HEP-try weightbearing exercise to RLE:  lateral weigthshifting or stagger stance weightshift; try RLE gastroc stretch.  Discuss possibility of aquatic therapy (if pt thinks this will be a possibility and doable for her, please discuss scheduling aquatic therapy with Judeen Hammans, 1x/wk and then 1x/wk stay in clinic)   Frazier Butt., PT 08/22/2022, 12:06 PM  [facility] at [facility] [address], [place], [place] [address] Phone # [phone] Fax # [phone]

## 2022-08-23 ENCOUNTER — Emergency Department (HOSPITAL_COMMUNITY): Payer: Medicare Other

## 2022-08-23 ENCOUNTER — Encounter (HOSPITAL_COMMUNITY): Payer: Self-pay | Admitting: *Deleted

## 2022-08-23 ENCOUNTER — Emergency Department (HOSPITAL_COMMUNITY)
Admission: EM | Admit: 2022-08-23 | Discharge: 2022-08-24 | Disposition: A | Discharge status: Other Acute Inpatient Hospital | Payer: Medicare Other | Attending: Student | Admitting: Student

## 2022-08-23 ENCOUNTER — Other Ambulatory Visit: Payer: Self-pay

## 2022-08-23 DIAGNOSIS — R0602 Shortness of breath: Secondary | ICD-10-CM | POA: Diagnosis not present

## 2022-08-23 DIAGNOSIS — Z87891 Personal history of nicotine dependence: Secondary | ICD-10-CM | POA: Insufficient documentation

## 2022-08-23 DIAGNOSIS — J45909 Unspecified asthma, uncomplicated: Secondary | ICD-10-CM | POA: Diagnosis not present

## 2022-08-23 DIAGNOSIS — Z20822 Contact with and (suspected) exposure to covid-19: Secondary | ICD-10-CM | POA: Diagnosis not present

## 2022-08-23 DIAGNOSIS — F22 Delusional disorders: Secondary | ICD-10-CM | POA: Insufficient documentation

## 2022-08-23 DIAGNOSIS — D72829 Elevated white blood cell count, unspecified: Secondary | ICD-10-CM | POA: Diagnosis not present

## 2022-08-23 DIAGNOSIS — Z79899 Other long term (current) drug therapy: Secondary | ICD-10-CM | POA: Diagnosis not present

## 2022-08-23 DIAGNOSIS — F209 Schizophrenia, unspecified: Secondary | ICD-10-CM | POA: Diagnosis not present

## 2022-08-23 DIAGNOSIS — R4182 Altered mental status, unspecified: Secondary | ICD-10-CM | POA: Insufficient documentation

## 2022-08-23 DIAGNOSIS — Z7951 Long term (current) use of inhaled steroids: Secondary | ICD-10-CM | POA: Insufficient documentation

## 2022-08-23 LAB — URINALYSIS, ROUTINE W REFLEX MICROSCOPIC
Bilirubin Urine: NEGATIVE
Glucose, UA: NEGATIVE mg/dL
Hgb urine dipstick: NEGATIVE
Ketones, ur: 5 mg/dL — AB
Nitrite: NEGATIVE
Protein, ur: NEGATIVE mg/dL
Specific Gravity, Urine: 1.012 (ref 1.005–1.030)
pH: 5 (ref 5.0–8.0)

## 2022-08-23 LAB — CBC WITH DIFFERENTIAL/PLATELET
Abs Immature Granulocytes: 0.05 10*3/uL (ref 0.00–0.07)
Basophils Absolute: 0.1 10*3/uL (ref 0.0–0.1)
Basophils Relative: 1 %
Eosinophils Absolute: 0 10*3/uL (ref 0.0–0.5)
Eosinophils Relative: 0 %
HCT: 29.1 % — ABNORMAL LOW (ref 36.0–46.0)
Hemoglobin: 10.9 g/dL — ABNORMAL LOW (ref 12.0–15.0)
Immature Granulocytes: 0 %
Lymphocytes Relative: 10 %
Lymphs Abs: 1.5 10*3/uL (ref 0.7–4.0)
MCH: 32.8 pg (ref 26.0–34.0)
MCHC: 37.5 g/dL — ABNORMAL HIGH (ref 30.0–36.0)
MCV: 87.7 fL (ref 80.0–100.0)
Monocytes Absolute: 2 10*3/uL — ABNORMAL HIGH (ref 0.1–1.0)
Monocytes Relative: 14 %
Neutro Abs: 11.1 10*3/uL — ABNORMAL HIGH (ref 1.7–7.7)
Neutrophils Relative %: 75 %
Platelets: 186 10*3/uL (ref 150–400)
RBC: 3.32 MIL/uL — ABNORMAL LOW (ref 3.87–5.11)
RDW: 15.1 % (ref 11.5–15.5)
WBC: 14.7 10*3/uL — ABNORMAL HIGH (ref 4.0–10.5)
nRBC: 0.5 % — ABNORMAL HIGH (ref 0.0–0.2)

## 2022-08-23 LAB — BRAIN NATRIURETIC PEPTIDE: B Natriuretic Peptide: 78.9 pg/mL (ref 0.0–100.0)

## 2022-08-23 LAB — RAPID URINE DRUG SCREEN, HOSP PERFORMED
Amphetamines: NOT DETECTED
Barbiturates: NOT DETECTED
Benzodiazepines: NOT DETECTED
Cocaine: NOT DETECTED
Opiates: NOT DETECTED
Tetrahydrocannabinol: POSITIVE — AB

## 2022-08-23 LAB — COMPREHENSIVE METABOLIC PANEL
ALT: 22 U/L (ref 0–44)
AST: 42 U/L — ABNORMAL HIGH (ref 15–41)
Albumin: 4.5 g/dL (ref 3.5–5.0)
Alkaline Phosphatase: 58 U/L (ref 38–126)
Anion gap: 11 (ref 5–15)
BUN: 23 mg/dL — ABNORMAL HIGH (ref 6–20)
CO2: 23 mmol/L (ref 22–32)
Calcium: 10 mg/dL (ref 8.9–10.3)
Chloride: 105 mmol/L (ref 98–111)
Creatinine, Ser: 1.22 mg/dL — ABNORMAL HIGH (ref 0.44–1.00)
GFR, Estimated: 53 mL/min — ABNORMAL LOW (ref >60)
Glucose, Bld: 103 mg/dL — ABNORMAL HIGH (ref 70–99)
Potassium: 3.7 mmol/L (ref 3.5–5.1)
Sodium: 139 mmol/L (ref 135–145)
Total Bilirubin: 3.5 mg/dL — ABNORMAL HIGH (ref 0.3–1.2)
Total Protein: 8.2 g/dL — ABNORMAL HIGH (ref 6.5–8.1)

## 2022-08-23 MED ORDER — OLANZAPINE 5 MG PO TBDP
5.0000 mg | ORAL_TABLET | Freq: Every day | ORAL | Status: DC
  Administered 2022-08-23: 5 mg via ORAL
  Filled 2022-08-23: qty 1

## 2022-08-23 MED ORDER — LISINOPRIL 10 MG PO TABS
10.0000 mg | ORAL_TABLET | Freq: Once | ORAL | Status: AC
  Administered 2022-08-23: 10 mg via ORAL
  Filled 2022-08-23: qty 1

## 2022-08-23 NOTE — ED Notes (Signed)
Patient transported to CT 

## 2022-08-23 NOTE — ED Notes (Signed)
Sitter at bedside.

## 2022-08-23 NOTE — ED Notes (Signed)
Received verbal report from Ashley J RN at this time 
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
## 2022-08-23 NOTE — ED Notes (Signed)
Pt belongings locked in locker 3 and valuables locked in security office.
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
## 2022-08-23 NOTE — ED Notes (Signed)
Ivc paperwork in process 

## 2022-08-23 NOTE — ED Notes (Signed)
Pt wanded by security. 
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
## 2022-08-24 DIAGNOSIS — F209 Schizophrenia, unspecified: Secondary | ICD-10-CM | POA: Diagnosis not present

## 2022-08-24 LAB — GC/CHLAMYDIA PROBE AMP (~~LOC~~) NOT AT ARMC
Chlamydia: NEGATIVE
Comment: NEGATIVE
Comment: NORMAL
Neisseria Gonorrhea: NEGATIVE

## 2022-08-24 LAB — TSH: TSH: 1.827 u[IU]/mL (ref 0.350–4.500)

## 2022-08-24 LAB — SARS CORONAVIRUS 2 BY RT PCR: SARS Coronavirus 2 by RT PCR: NEGATIVE

## 2022-08-24 MED ORDER — LORAZEPAM 2 MG/ML IJ SOLN
1.0000 mg | Freq: Once | INTRAMUSCULAR | Status: AC
  Administered 2022-08-24: 1 mg via INTRAMUSCULAR
  Filled 2022-08-24: qty 1

## 2022-08-24 MED ORDER — ONDANSETRON 4 MG PO TBDP
4.0000 mg | ORAL_TABLET | Freq: Once | ORAL | Status: AC
  Administered 2022-08-24: 4 mg via ORAL
  Filled 2022-08-24: qty 1

## 2022-08-24 MED ORDER — LORAZEPAM 1 MG PO TABS: 1.0000 mg | ORAL_TABLET | Freq: Three times a day (TID) | ORAL | Status: DC | PRN

## 2022-08-24 MED ORDER — GABAPENTIN 100 MG PO CAPS
100.0000 mg | ORAL_CAPSULE | Freq: Two times a day (BID) | ORAL | Status: DC
  Administered 2022-08-24: 100 mg via ORAL
  Filled 2022-08-24: qty 1

## 2022-08-24 NOTE — ED Provider Notes (Signed)
Emergency Medicine Observation Re-evaluation Note  George Ensz is a 51 y.o. female, seen on rounds today.  Pt initially presented to the ED for complaints of No chief complaint on file. Currently, the patient is resting comfortably without distress.  Physical Exam  BP (!) 128/90 (BP Location: Right Arm)   Pulse 96   Temp 98.5 F (36.9 C) (Oral)   Resp 16   Ht '5\' 9"'$  (1.753 m)   Wt 54 kg   LMP 04/07/2013 Comment: neg preg 11/03/16  SpO2 (!) 89%   BMI 17.58 kg/m  Physical Exam General: Resting  Cardiac: No murmur on my auscultation Lungs: Lungs clear bilaterally Psych: No agitation at this time  ED Course / MDM  EKG:   I have reviewed the labs performed to date as well as medications administered while in observation.  Recent changes in the last 24 hours include none reported.  Plan  Current plan is for awaiting placement.    Sharelle Burditt, Gwenyth Allegra, MD 08/24/22 [phone]

## 2022-08-24 NOTE — ED Notes (Signed)
Per Windell Moment, PA-C okay for patient to go to [facility] as long as patient is feeling okay and vomiting has stopped. Vomiting has stopped and patient feeling better. Report called and [facility] called to transport patient.

## 2022-08-24 NOTE — ED Notes (Signed)
Attempted to complete an assessment on pt. Pt is extremely hard of hearing. Pt has hearing aids however the battery is dead. Pt is unable to answer questions due to her not being able to hear

## 2022-08-24 NOTE — Progress Notes (Addendum)
Pt was accepted to [facility] Today 08/24/22 PENDING Negative COVID-19; Bed Assignment [place]  Pt meets inpatient criteria per Merlyn Lot, NP  Attending Physician will be Dr. Jonelle Sports  Report can be called to: [phone]  Pt can arrive after PENDING COVID-19  Care Team Notified: Abran Richard, RN, and Merlyn Lot, NP, and Elsie Lincoln, RN   Nadara Mode, [place] 08/24/2022 @ 11:45 AM

## 2022-08-24 NOTE — ED Notes (Signed)
Ambulated to restroom without assistance at this time

## 2022-08-24 NOTE — ED Provider Notes (Signed)
Blood pressure (!) 125/95, pulse (!) 111, temperature 97.8 F (36.6 C), temperature source Oral, resp. rate 20, height '5\' 9"'$  (1.753 m), weight 54 kg, last menstrual period 04/07/2013, SpO2 91 %.  Assuming care from Dr. Matilde Sprang.  In short, Brittany Shannon is a 51 y.o. female with a chief complaint of No chief complaint on file. [name]  Refer to the original H&P for additional details.  The current plan of care is to admit to inpatient psych. Psych NP with no further med recommendations other than meds already ordered.     Margette Fast, MD 08/24/22 [phone]

## 2022-08-24 NOTE — ED Notes (Signed)
Pt vomiting continued. Windell Moment, PA-C made aware and at bedside to evaluate patient

## 2022-08-29 ENCOUNTER — Ambulatory Visit: Payer: Medicare Other | Admitting: Physical Therapy

## 2022-08-30 ENCOUNTER — Ambulatory Visit: Payer: Medicare Other | Admitting: Family Medicine

## 2022-08-31 ENCOUNTER — Ambulatory Visit: Payer: Medicare Other | Admitting: Physical Therapy

## 2022-09-05 ENCOUNTER — Ambulatory Visit: Payer: Medicare Other | Admitting: Physical Therapy

## 2022-09-06 ENCOUNTER — Telehealth: Payer: Self-pay | Admitting: Neurology

## 2022-09-06 NOTE — Telephone Encounter (Signed)
Sent MyChart msg informing pt of appt change, MD out 11/16. Called pt, no answer and unable to LVM.

## 2022-09-07 ENCOUNTER — Ambulatory Visit: Payer: Medicare Other

## 2022-09-12 ENCOUNTER — Ambulatory Visit: Payer: Medicare Other | Admitting: Physical Therapy

## 2022-09-14 ENCOUNTER — Ambulatory Visit: Payer: Medicare Other | Admitting: Physical Therapy

## 2022-09-19 ENCOUNTER — Ambulatory Visit: Payer: Medicare Other

## 2022-09-21 ENCOUNTER — Ambulatory Visit: Payer: Medicare Other | Admitting: Physical Therapy

## 2022-10-24 ENCOUNTER — Ambulatory Visit: Payer: Medicare Other | Admitting: Adult Health

## 2022-10-24 ENCOUNTER — Encounter: Payer: Self-pay | Admitting: Neurology

## 2022-10-24 ENCOUNTER — Ambulatory Visit: Payer: Medicare Other | Admitting: Neurology

## 2022-10-26 ENCOUNTER — Ambulatory Visit: Payer: Medicare Other | Admitting: Neurology

## 2023-01-07 ENCOUNTER — Other Ambulatory Visit: Payer: Self-pay | Admitting: Neurology

## 2023-01-25 ENCOUNTER — Other Ambulatory Visit: Payer: Self-pay

## 2023-01-26 ENCOUNTER — Encounter: Payer: Self-pay | Admitting: Sports Medicine

## 2023-01-27 ENCOUNTER — Encounter: Payer: Self-pay | Admitting: Family Medicine

## 2023-01-29 MED ORDER — GABAPENTIN 100 MG PO CAPS: 100.0000 mg | ORAL_CAPSULE | Freq: Two times a day (BID) | ORAL | 2 refills | Status: AC

## 2023-02-01 ENCOUNTER — Other Ambulatory Visit: Payer: Self-pay

## 2023-02-01 MED ORDER — LISINOPRIL 10 MG PO TABS: ORAL_TABLET | ORAL | 0 refills | Status: AC

## 2023-02-01 NOTE — Telephone Encounter (Signed)
From: Guerry Bruin To: Office of Vevelyn Francois, [place] Sent: 02/01/2023 10:57 AM EST Subject: Medication Renewal Request  Refills have been requested for the following medications:   lisinopril (ZESTRIL) 10 MG tablet [place]]  Preferred pharmacy: [facility] [phone] - [place], [place] Delivery method: [employer]

## 2023-06-01 ENCOUNTER — Other Ambulatory Visit: Payer: Self-pay | Admitting: Family Medicine

## 2023-07-13 ENCOUNTER — Other Ambulatory Visit: Payer: Self-pay | Admitting: Family Medicine

## 2023-07-18 ENCOUNTER — Other Ambulatory Visit: Payer: Self-pay | Admitting: Family Medicine

## 2023-07-28 ENCOUNTER — Other Ambulatory Visit: Payer: Self-pay | Admitting: Family Medicine
